# Patient Record
Sex: Male | Born: 1950
Health system: Southern US, Community
[De-identification: ages and names within clinical notes are randomized; demographics above are authoritative.]

## PROBLEM LIST (undated history)

## (undated) DIAGNOSIS — I10 Essential (primary) hypertension: Secondary | ICD-10-CM

## (undated) DIAGNOSIS — E079 Disorder of thyroid, unspecified: Secondary | ICD-10-CM

## (undated) DIAGNOSIS — E119 Type 2 diabetes mellitus without complications: Secondary | ICD-10-CM

## (undated) DIAGNOSIS — E785 Hyperlipidemia, unspecified: Secondary | ICD-10-CM

## (undated) DIAGNOSIS — F329 Major depressive disorder, single episode, unspecified: Secondary | ICD-10-CM

## (undated) DIAGNOSIS — Z9989 Dependence on other enabling machines and devices: Secondary | ICD-10-CM

## (undated) DIAGNOSIS — I2510 Atherosclerotic heart disease of native coronary artery without angina pectoris: Secondary | ICD-10-CM

## (undated) DIAGNOSIS — G4733 Obstructive sleep apnea (adult) (pediatric): Secondary | ICD-10-CM

## (undated) HISTORY — PX: RETINAL DETACHMENT SURGERY: SHX105

## (undated) HISTORY — DX: Atherosclerotic heart disease of native coronary artery without angina pectoris: I25.10

## (undated) HISTORY — PX: APPENDECTOMY: SHX54

## (undated) HISTORY — DX: Disorder of thyroid, unspecified: E07.9

## (undated) HISTORY — DX: Essential (primary) hypertension: I10

## (undated) HISTORY — DX: Hyperlipidemia, unspecified: E78.5

## (undated) HISTORY — DX: Major depressive disorder, single episode, unspecified: F32.9

## (undated) HISTORY — DX: Obstructive sleep apnea (adult) (pediatric): G47.33

## (undated) HISTORY — DX: Type 2 diabetes mellitus without complications: E11.9

## (undated) HISTORY — DX: Dependence on other enabling machines and devices: Z99.89

## (undated) HISTORY — PX: CATARACT EXTRACTION, BILATERAL: SHX1313

---

## 1993-07-26 DIAGNOSIS — F32A Depression, unspecified: Secondary | ICD-10-CM

## 1993-07-26 DIAGNOSIS — E079 Disorder of thyroid, unspecified: Secondary | ICD-10-CM

## 1993-07-26 HISTORY — DX: Depression, unspecified: F32.A

## 1993-07-26 HISTORY — DX: Disorder of thyroid, unspecified: E07.9

## 1996-02-24 DIAGNOSIS — E785 Hyperlipidemia, unspecified: Secondary | ICD-10-CM

## 1996-02-24 HISTORY — PX: CORONARY ARTERY BYPASS GRAFT: SHX141

## 1996-02-24 HISTORY — PX: OTHER SURGICAL HISTORY: SHX169

## 1996-02-24 HISTORY — DX: Hyperlipidemia, unspecified: E78.5

## 1996-03-26 HISTORY — PX: OTHER SURGICAL HISTORY: SHX169

## 1999-01-21 ENCOUNTER — Encounter: Payer: Self-pay | Admitting: Family Medicine

## 1999-01-21 LAB — CONVERTED CEMR LAB: Hgb A1c MFr Bld: 7.3 %

## 2000-07-08 ENCOUNTER — Encounter: Payer: Self-pay | Admitting: Family Medicine

## 2000-07-08 LAB — CONVERTED CEMR LAB: PSA: 0.4 ng/mL

## 2000-12-11 ENCOUNTER — Encounter: Payer: Self-pay | Admitting: Emergency Medicine

## 2000-12-11 ENCOUNTER — Emergency Department (HOSPITAL_COMMUNITY): Admission: EM | Admit: 2000-12-11 | Discharge: 2000-12-11 | Payer: Self-pay | Admitting: Emergency Medicine

## 2001-11-24 ENCOUNTER — Encounter: Payer: Self-pay | Admitting: Family Medicine

## 2001-11-24 LAB — CONVERTED CEMR LAB: PSA: 0.3 ng/mL

## 2001-12-07 ENCOUNTER — Encounter: Payer: Self-pay | Admitting: Family Medicine

## 2001-12-07 LAB — CONVERTED CEMR LAB: Hgb A1c MFr Bld: 5.4 %

## 2002-06-19 ENCOUNTER — Encounter: Payer: Self-pay | Admitting: Family Medicine

## 2002-06-19 LAB — CONVERTED CEMR LAB: Hgb A1c MFr Bld: 5.7 %

## 2002-12-27 ENCOUNTER — Encounter: Payer: Self-pay | Admitting: Family Medicine

## 2002-12-27 LAB — CONVERTED CEMR LAB: PSA: 0.2 ng/mL

## 2002-12-28 ENCOUNTER — Encounter: Payer: Self-pay | Admitting: Family Medicine

## 2002-12-28 LAB — CONVERTED CEMR LAB: Hgb A1c MFr Bld: 5.9 %

## 2005-01-12 ENCOUNTER — Ambulatory Visit: Payer: Self-pay | Admitting: Family Medicine

## 2005-06-22 ENCOUNTER — Ambulatory Visit: Payer: Self-pay | Admitting: Family Medicine

## 2005-06-22 LAB — CONVERTED CEMR LAB: PSA: 0.32 ng/mL

## 2005-06-24 ENCOUNTER — Ambulatory Visit: Payer: Self-pay | Admitting: Family Medicine

## 2005-12-24 ENCOUNTER — Ambulatory Visit: Payer: Self-pay | Admitting: Family Medicine

## 2006-01-03 ENCOUNTER — Ambulatory Visit (HOSPITAL_BASED_OUTPATIENT_CLINIC_OR_DEPARTMENT_OTHER): Admission: RE | Admit: 2006-01-03 | Discharge: 2006-01-03 | Payer: Self-pay | Admitting: Family Medicine

## 2006-01-03 HISTORY — PX: OTHER SURGICAL HISTORY: SHX169

## 2006-01-07 ENCOUNTER — Ambulatory Visit: Payer: Self-pay | Admitting: Family Medicine

## 2006-01-12 ENCOUNTER — Ambulatory Visit: Payer: Self-pay | Admitting: Pulmonary Disease

## 2006-05-06 ENCOUNTER — Ambulatory Visit: Payer: Self-pay | Admitting: Family Medicine

## 2006-12-01 ENCOUNTER — Ambulatory Visit: Payer: Self-pay | Admitting: Internal Medicine

## 2006-12-10 ENCOUNTER — Ambulatory Visit: Payer: Self-pay | Admitting: Family Medicine

## 2007-02-16 ENCOUNTER — Ambulatory Visit: Payer: Self-pay | Admitting: Family Medicine

## 2007-02-16 LAB — CONVERTED CEMR LAB
ALT: 66 units/L — ABNORMAL HIGH (ref 0–40)
AST: 49 units/L — ABNORMAL HIGH (ref 0–37)
Albumin: 3.9 g/dL (ref 3.5–5.2)
Alkaline Phosphatase: 65 units/L (ref 39–117)
BUN: 11 mg/dL (ref 6–23)
Basophils Absolute: 0.1 10*3/uL (ref 0.0–0.1)
Basophils Relative: 1.1 % — ABNORMAL HIGH (ref 0.0–1.0)
Bilirubin, Direct: 0.1 mg/dL (ref 0.0–0.3)
CO2: 30 meq/L (ref 19–32)
Calcium: 8.8 mg/dL (ref 8.4–10.5)
Chloride: 107 meq/L (ref 96–112)
Cholesterol: 183 mg/dL (ref 0–200)
Creatinine, Ser: 0.9 mg/dL (ref 0.4–1.5)
Eosinophils Absolute: 0.1 10*3/uL (ref 0.0–0.6)
Eosinophils Relative: 2.2 % (ref 0.0–5.0)
Ferritin: 323.7 ng/mL — ABNORMAL HIGH (ref 22.0–322.0)
Free T4: 0.7 ng/dL (ref 0.6–1.6)
GFR calc Af Amer: 112 mL/min
GFR calc non Af Amer: 92 mL/min
Glucose, Bld: 103 mg/dL — ABNORMAL HIGH (ref 70–99)
HCT: 41.6 % (ref 39.0–52.0)
HDL: 45 mg/dL (ref >39.0)
Hemoglobin: 14.3 g/dL (ref 13.0–17.0)
LDL Cholesterol: 109 mg/dL — ABNORMAL HIGH (ref 0–99)
Lymphocytes Relative: 40.1 % (ref 12.0–46.0)
MCHC: 34.4 g/dL (ref 30.0–36.0)
MCV: 102.4 fL — ABNORMAL HIGH (ref 78.0–100.0)
Monocytes Absolute: 0.9 10*3/uL — ABNORMAL HIGH (ref 0.2–0.7)
Monocytes Relative: 18.9 % — ABNORMAL HIGH (ref 3.0–11.0)
Neutro Abs: 1.9 10*3/uL (ref 1.4–7.7)
Neutrophils Relative %: 37.7 % — ABNORMAL LOW (ref 43.0–77.0)
PSA: 0.51 ng/mL (ref 0.10–4.00)
Platelets: 259 10*3/uL (ref 150–400)
Potassium: 4.6 meq/L (ref 3.5–5.1)
RBC: 4.06 M/uL — ABNORMAL LOW (ref 4.22–5.81)
RDW: 12.3 % (ref 11.5–14.6)
Sodium: 142 meq/L (ref 135–145)
TSH: 1.97 microintl units/mL (ref 0.35–5.50)
Total Bilirubin: 0.6 mg/dL (ref 0.3–1.2)
Total CHOL/HDL Ratio: 4.1
Total Protein: 7.3 g/dL (ref 6.0–8.3)
Triglycerides: 144 mg/dL (ref 0–149)
VLDL: 29 mg/dL (ref 0–40)
Vitamin B-12: >1500 pg/mL — ABNORMAL HIGH (ref 211–911)
WBC: 5 10*3/uL (ref 4.5–10.5)

## 2007-02-17 ENCOUNTER — Encounter: Payer: Self-pay | Admitting: Family Medicine

## 2007-02-17 DIAGNOSIS — E039 Hypothyroidism, unspecified: Secondary | ICD-10-CM | POA: Insufficient documentation

## 2007-02-17 DIAGNOSIS — I10 Essential (primary) hypertension: Secondary | ICD-10-CM | POA: Insufficient documentation

## 2007-02-17 DIAGNOSIS — E785 Hyperlipidemia, unspecified: Secondary | ICD-10-CM | POA: Insufficient documentation

## 2007-02-17 DIAGNOSIS — I252 Old myocardial infarction: Secondary | ICD-10-CM | POA: Insufficient documentation

## 2007-02-17 DIAGNOSIS — I2510 Atherosclerotic heart disease of native coronary artery without angina pectoris: Secondary | ICD-10-CM | POA: Insufficient documentation

## 2007-02-17 LAB — CONVERTED CEMR LAB: PSA: 0.51 ng/mL

## 2007-02-18 ENCOUNTER — Ambulatory Visit: Payer: Self-pay | Admitting: Family Medicine

## 2007-05-17 ENCOUNTER — Ambulatory Visit: Payer: Self-pay | Admitting: Family Medicine

## 2007-05-17 DIAGNOSIS — S336XXA Sprain of sacroiliac joint, initial encounter: Secondary | ICD-10-CM | POA: Insufficient documentation

## 2007-06-03 ENCOUNTER — Ambulatory Visit: Payer: Self-pay | Admitting: Family Medicine

## 2007-10-31 ENCOUNTER — Ambulatory Visit: Payer: Self-pay | Admitting: Family Medicine

## 2007-10-31 LAB — CONVERTED CEMR LAB
ALT: 102 units/L — ABNORMAL HIGH (ref 0–53)
AST: 55 units/L — ABNORMAL HIGH (ref 0–37)
BUN: 16 mg/dL (ref 6–23)
Basophils Absolute: 0.1 10*3/uL (ref 0.0–0.1)
Basophils Relative: 0.9 % (ref 0.0–1.0)
CO2: 29 meq/L (ref 19–32)
Calcium: 9.6 mg/dL (ref 8.4–10.5)
Chloride: 100 meq/L (ref 96–112)
Cholesterol: 180 mg/dL (ref 0–200)
Creatinine, Ser: 1 mg/dL (ref 0.4–1.5)
Eosinophils Absolute: 0.1 10*3/uL (ref 0.0–0.6)
Eosinophils Relative: 1.9 % (ref 0.0–5.0)
Free T4: 0.8 ng/dL (ref 0.6–1.6)
GFR calc Af Amer: 99 mL/min
GFR calc non Af Amer: 82 mL/min
Glucose, Bld: 128 mg/dL — ABNORMAL HIGH (ref 70–99)
HCT: 46.4 % (ref 39.0–52.0)
HDL: 44.3 mg/dL (ref >39.0)
Hemoglobin: 15.9 g/dL (ref 13.0–17.0)
LDL Cholesterol: 115 mg/dL — ABNORMAL HIGH (ref 0–99)
Lymphocytes Relative: 31.4 % (ref 12.0–46.0)
MCHC: 34.2 g/dL (ref 30.0–36.0)
MCV: 103.9 fL — ABNORMAL HIGH (ref 78.0–100.0)
Monocytes Absolute: 1.2 10*3/uL — ABNORMAL HIGH (ref 0.2–0.7)
Monocytes Relative: 16.9 % — ABNORMAL HIGH (ref 3.0–11.0)
Neutro Abs: 3.4 10*3/uL (ref 1.4–7.7)
Neutrophils Relative %: 48.9 % (ref 43.0–77.0)
PSA: 0.37 ng/mL (ref 0.10–4.00)
Platelets: 241 10*3/uL (ref 150–400)
Potassium: 4.4 meq/L (ref 3.5–5.1)
RBC: 4.47 M/uL (ref 4.22–5.81)
RDW: 11.8 % (ref 11.5–14.6)
Sodium: 137 meq/L (ref 135–145)
TSH: 2.92 microintl units/mL (ref 0.35–5.50)
Total CHOL/HDL Ratio: 4.1
Triglycerides: 106 mg/dL (ref 0–149)
VLDL: 21 mg/dL (ref 0–40)
WBC: 7 10*3/uL (ref 4.5–10.5)

## 2007-11-03 ENCOUNTER — Ambulatory Visit: Payer: Self-pay | Admitting: Family Medicine

## 2007-11-03 DIAGNOSIS — R945 Abnormal results of liver function studies: Secondary | ICD-10-CM | POA: Insufficient documentation

## 2008-03-13 ENCOUNTER — Ambulatory Visit: Payer: Self-pay | Admitting: Family Medicine

## 2008-03-28 ENCOUNTER — Telehealth: Payer: Self-pay | Admitting: Family Medicine

## 2008-04-05 ENCOUNTER — Telehealth: Payer: Self-pay | Admitting: Family Medicine

## 2009-02-14 ENCOUNTER — Telehealth: Payer: Self-pay | Admitting: Family Medicine

## 2009-04-09 ENCOUNTER — Telehealth: Payer: Self-pay | Admitting: Family Medicine

## 2009-04-18 ENCOUNTER — Ambulatory Visit: Payer: Self-pay | Admitting: Family Medicine

## 2009-05-22 ENCOUNTER — Encounter: Payer: Self-pay | Admitting: Family Medicine

## 2009-07-15 ENCOUNTER — Ambulatory Visit: Payer: Self-pay | Admitting: Family Medicine

## 2009-07-15 LAB — CONVERTED CEMR LAB
ALT: 37 units/L (ref 0–53)
AST: 27 units/L (ref 0–37)
Albumin: 3.8 g/dL (ref 3.5–5.2)
Alkaline Phosphatase: 61 units/L (ref 39–117)
BUN: 17 mg/dL (ref 6–23)
Basophils Absolute: 0 10*3/uL (ref 0.0–0.1)
Basophils Relative: 0.4 % (ref 0.0–3.0)
Bilirubin, Direct: 0 mg/dL (ref 0.0–0.3)
CO2: 29 meq/L (ref 19–32)
Calcium: 9.2 mg/dL (ref 8.4–10.5)
Chloride: 103 meq/L (ref 96–112)
Cholesterol: 156 mg/dL (ref 0–200)
Creatinine, Ser: 0.8 mg/dL (ref 0.4–1.5)
Eosinophils Absolute: 0.1 10*3/uL (ref 0.0–0.7)
Eosinophils Relative: 1.9 % (ref 0.0–5.0)
Free T4: 0.9 ng/dL (ref 0.6–1.6)
GFR calc non Af Amer: 104.94 mL/min (ref >60)
Glucose, Bld: 106 mg/dL — ABNORMAL HIGH (ref 70–99)
HCT: 43.8 % (ref 39.0–52.0)
HDL: 34.4 mg/dL — ABNORMAL LOW (ref >39.00)
Hemoglobin: 14.8 g/dL (ref 13.0–17.0)
LDL Cholesterol: 94 mg/dL (ref 0–99)
Lymphocytes Relative: 34.7 % (ref 12.0–46.0)
Lymphs Abs: 2.4 10*3/uL (ref 0.7–4.0)
MCHC: 33.7 g/dL (ref 30.0–36.0)
MCV: 99.6 fL (ref 78.0–100.0)
Monocytes Absolute: 1.1 10*3/uL — ABNORMAL HIGH (ref 0.1–1.0)
Monocytes Relative: 15.9 % — ABNORMAL HIGH (ref 3.0–12.0)
Neutro Abs: 3.3 10*3/uL (ref 1.4–7.7)
Neutrophils Relative %: 47.1 % (ref 43.0–77.0)
PSA: 0.3 ng/mL (ref 0.10–4.00)
Platelets: 272 10*3/uL (ref 150.0–400.0)
Potassium: 4.9 meq/L (ref 3.5–5.1)
RBC: 4.4 M/uL (ref 4.22–5.81)
RDW: 12.4 % (ref 11.5–14.6)
Sodium: 138 meq/L (ref 135–145)
TSH: 0.24 microintl units/mL — ABNORMAL LOW (ref 0.35–5.50)
Total Bilirubin: 0.7 mg/dL (ref 0.3–1.2)
Total CHOL/HDL Ratio: 5
Total Protein: 7.4 g/dL (ref 6.0–8.3)
Triglycerides: 138 mg/dL (ref 0.0–149.0)
VLDL: 27.6 mg/dL (ref 0.0–40.0)
WBC: 6.9 10*3/uL (ref 4.5–10.5)

## 2009-07-16 LAB — CONVERTED CEMR LAB: Vit D, 25-Hydroxy: 22 ng/mL — ABNORMAL LOW (ref 30–89)

## 2009-07-17 ENCOUNTER — Ambulatory Visit: Payer: Self-pay | Admitting: Family Medicine

## 2009-07-17 DIAGNOSIS — K625 Hemorrhage of anus and rectum: Secondary | ICD-10-CM | POA: Insufficient documentation

## 2009-07-29 ENCOUNTER — Ambulatory Visit: Payer: Self-pay | Admitting: Family Medicine

## 2009-11-18 ENCOUNTER — Telehealth: Payer: Self-pay | Admitting: Family Medicine

## 2009-11-26 ENCOUNTER — Ambulatory Visit: Payer: Self-pay | Admitting: Family Medicine

## 2010-05-19 ENCOUNTER — Telehealth: Payer: Self-pay | Admitting: Family Medicine

## 2010-05-28 ENCOUNTER — Encounter (INDEPENDENT_AMBULATORY_CARE_PROVIDER_SITE_OTHER): Payer: Self-pay | Admitting: *Deleted

## 2010-06-11 ENCOUNTER — Telehealth: Payer: Self-pay | Admitting: Family Medicine

## 2010-07-01 ENCOUNTER — Ambulatory Visit: Payer: Self-pay | Admitting: Family Medicine

## 2010-07-19 ENCOUNTER — Ambulatory Visit: Payer: Self-pay | Admitting: Ophthalmology

## 2010-08-13 ENCOUNTER — Ambulatory Visit: Payer: Self-pay | Admitting: Family Medicine

## 2010-08-13 DIAGNOSIS — K439 Ventral hernia without obstruction or gangrene: Secondary | ICD-10-CM | POA: Insufficient documentation

## 2010-08-26 ENCOUNTER — Ambulatory Visit: Payer: Self-pay | Admitting: Family Medicine

## 2010-08-27 LAB — CONVERTED CEMR LAB
ALT: 33 units/L (ref 0–53)
AST: 29 units/L (ref 0–37)
Albumin: 3.9 g/dL (ref 3.5–5.2)
Alkaline Phosphatase: 64 units/L (ref 39–117)
BUN: 16 mg/dL (ref 6–23)
Basophils Absolute: 0 10*3/uL (ref 0.0–0.1)
Basophils Relative: 0.5 % (ref 0.0–3.0)
Bilirubin, Direct: 0 mg/dL (ref 0.0–0.3)
CO2: 30 meq/L (ref 19–32)
Calcium: 9.7 mg/dL (ref 8.4–10.5)
Chloride: 105 meq/L (ref 96–112)
Cholesterol: 177 mg/dL (ref 0–200)
Creatinine, Ser: 0.9 mg/dL (ref 0.4–1.5)
Eosinophils Absolute: 0.1 10*3/uL (ref 0.0–0.7)
Eosinophils Relative: 1.2 % (ref 0.0–5.0)
Free T4: 0.77 ng/dL (ref 0.60–1.60)
GFR calc non Af Amer: 90.11 mL/min (ref >60)
Glucose, Bld: 113 mg/dL — ABNORMAL HIGH (ref 70–99)
HCT: 41 % (ref 39.0–52.0)
HDL: 39 mg/dL — ABNORMAL LOW (ref >39.00)
Hemoglobin: 14.2 g/dL (ref 13.0–17.0)
LDL Cholesterol: 102 mg/dL — ABNORMAL HIGH (ref 0–99)
Lymphocytes Relative: 30.8 % (ref 12.0–46.0)
Lymphs Abs: 2.3 10*3/uL (ref 0.7–4.0)
MCHC: 34.5 g/dL (ref 30.0–36.0)
MCV: 99.9 fL (ref 78.0–100.0)
Monocytes Absolute: 1.1 10*3/uL — ABNORMAL HIGH (ref 0.1–1.0)
Monocytes Relative: 14.8 % — ABNORMAL HIGH (ref 3.0–12.0)
Neutro Abs: 3.9 10*3/uL (ref 1.4–7.7)
Neutrophils Relative %: 52.7 % (ref 43.0–77.0)
PSA: 0.33 ng/mL (ref 0.10–4.00)
Platelets: 273 10*3/uL (ref 150.0–400.0)
Potassium: 5 meq/L (ref 3.5–5.1)
RBC: 4.1 M/uL — ABNORMAL LOW (ref 4.22–5.81)
RDW: 13.5 % (ref 11.5–14.6)
Sodium: 140 meq/L (ref 135–145)
TSH: 0.75 microintl units/mL (ref 0.35–5.50)
Total Bilirubin: 0.4 mg/dL (ref 0.3–1.2)
Total CHOL/HDL Ratio: 5
Total Protein: 7.1 g/dL (ref 6.0–8.3)
Triglycerides: 180 mg/dL — ABNORMAL HIGH (ref 0.0–149.0)
VLDL: 36 mg/dL (ref 0.0–40.0)
Vit D, 25-Hydroxy: 34 ng/mL (ref 30–89)
WBC: 7.4 10*3/uL (ref 4.5–10.5)

## 2010-08-28 ENCOUNTER — Ambulatory Visit: Payer: Self-pay | Admitting: Family Medicine

## 2010-11-27 NOTE — Assessment & Plan Note (Signed)
Summary: COLD/CLE   Vital Signs:  Patient profile:   60 year old male Weight:      201 pounds O2 Sat:      96 % on Room air Temp:     98.5 degrees F oral Pulse rate:   80 / minute Pulse rhythm:   regular BP sitting:   110 / 70  (left arm) Cuff size:   large  Vitals Entered By: Sydell Axon LPN (November 26, 2009 10:35 AM)  O2 Flow:  Room air CC: Head congestion, cough and fatigue   History of Present Illness: Pt here for 21/2 weeks, he is having better headache from initially, no fever, no chills, no ear pain, rhinitis thatis clear, no ST and sifnif cough now minimally productive...having hard time with getting his energy back. He is sleeping ok. The ST wasn't terrible. He has no SOB or N/V. He has taken Amox and guaifenesin. His biggest problem with this is lavck of energy. He just can't get his energy level to wear he feels like he can do his job. And his wife has something planned for his birthday on the 10th.   Problems Prior to Update: 1)  Rectal Bleeding  (ICD-569.3) 2)  Health Maintenance Exam  (ICD-V70.0) 3)  Liver Function Tests, Abnormal  (ICD-794.8) 4)  Fatigue  (ICD-780.79) 5)  Special Screening Malignant Neoplasm of Prostate  (ICD-V76.44) 6)  Sprain/strain, Sacroiliac Site Nos  (ICD-846.9) 7)  Infarction, Myocardial, Old  (ICD-412) 8)  Plantar Fasciitis  (ICD-728.71) 9)  Hematuria, Microscopic, Hx of (HUMPHRIES)  (ICD-V13.09) 10)  Cad  (ICD-414.00) 11)  Hypothyroidism  (ICD-244.9) 12)  Hypertension  (ICD-401.9) 13)  Hyperlipidemia  (ICD-272.4) 14)  Depression,recurrent  (ICD-311)  Medications Prior to Update: 1)  Synthroid 175 Mcg Tabs (Levothyroxine Sodium) .Marland Kitchen.. 1 Tablet By Mouth Once A Day 2)  Lisinopril 20 Mg Tabs (Lisinopril) .... Take 1 Tablet By Mouth Once A Day 3)  Zoloft 50 Mg  Tabs (Sertraline Hcl) .Marland Kitchen.. 1 Tablet Daily 4)  Aspirin 325 Mg  Tabs (Aspirin) .... Take 1 Tablet By Mouth Once A Day 5)  Vitamin D (Ergocalciferol) 50000 Unit Caps  (Ergocalciferol) .... One Tab By Mouth Weekly. 6)  Anusol-Hc 25 Mg Supp (Hydrocortisone Acetate) .... One Supp Per Rectum Three Times A Day 7)  Amoxicillin 500 Mg Tabs (Amoxicillin) .... One Tab By Mouth Three Times A Day  Allergies: No Known Drug Allergies  Physical Exam  General:  Well-developed,well-nourished,in no acute distress; alert,appropriate and cooperative throughout examination. Mildly obese. Mildly congested. Head:  Normocephalic and atraumatic without obvious abnormalities. No apparent alopecia or balding. Sinuses non tender. Eyes:  Conjunctiva clear bilaterally.  Ears:  External ear exam shows no significant lesions or deformities.  Otoscopic examination reveals clear canals, tympanic membranes are intact bilaterally without bulging, retraction, inflammation or discharge. Hearing is grossly normal bilaterally. Nose:  External nasal examination shows no deformity or inflammation. Nasal mucosa are pink and moist without lesions or exudates. Mouth:  Oral mucosa and oropharynx without lesions or exudates.  Teeth in good repair. White PND. Neck:  No deformities, masses, or tenderness noted. Chest Wall:  No deformities, masses, tenderness or gynecomastia noted. Lungs:  Normal respiratory effort, chest expands symmetrically. Lungs are clear to auscultation, no crackles or wheezes. Heart:  Normal rate and regular rhythm. S1 and S2 normal without gallop, murmur, click, rub or other extra sounds.   Impression & Recommendations:  Problem # 1:  URI (ICD-465.9) Assessment New  Think he is  in the middle to end of a viral syndrome and must fight it off. He is sleeping ok and cough not debilitating at this point. Cont guaifenesin and let me know if sxs worsen. His updated medication list for this problem includes:    Aspirin 325 Mg Tabs (Aspirin) .Marland Kitchen... Take 1 tablet by mouth once a day  Instructed on symptomatic treatment. Call if symptoms persist or worsen.   Complete Medication  List: 1)  Synthroid 175 Mcg Tabs (Levothyroxine sodium) .Marland Kitchen.. 1 tablet by mouth once a day 2)  Lisinopril 20 Mg Tabs (Lisinopril) .... Take 1 tablet by mouth once a day 3)  Zoloft 50 Mg Tabs (Sertraline hcl) .Marland Kitchen.. 1 tablet daily 4)  Aspirin 325 Mg Tabs (Aspirin) .... Take 1 tablet by mouth once a day 5)  Vitamin D (ergocalciferol) 50000 Unit Caps (Ergocalciferol) .... One tab by mouth weekly. 6)  Anusol-hc 25 Mg Supp (Hydrocortisone acetate) .... One supp per rectum three times a day  Patient Instructions: 1)  Call if worsens to consider Zpak in f/u of Amox.  Current Allergies (reviewed today): No known allergies

## 2010-11-27 NOTE — Assessment & Plan Note (Signed)
Summary: 71mo follow up/eve   Vital Signs:  Patient Profile:   60 Years Old Male Height:     65 inches (165.1 cm) Weight:      196 pounds Temp:     98.5 degrees F oral Pulse rate:   72 / minute Pulse rhythm:   regular BP sitting:   120 / 78  (left arm) Cuff size:   regular  Vitals Entered By: Providence Crosby (November 03, 2007 9:14 AM)                 Chief Complaint:  6 month f/u.  History of Present Illness: 6 month follow up for hyperlipidemia, hypothyroidism and depression.  Tolerating medications well.  No side effects.  Depression well controlled. Complains of fatigue throughout the day  Current Allergies: No known allergies       Physical Exam  General:     Well-developed,well-nourished,in no acute distress; alert,appropriate and cooperative throughout examination Head:     Normocephalic and atraumatic without obvious abnormalities. No apparent alopecia or balding. Eyes:     Conjunctiva clear bilaterally.  Ears:     External ear exam shows no significant lesions or deformities.  Otoscopic examination reveals clear canals, tympanic membranes are intact bilaterally without bulging, retraction, inflammation or discharge. Hearing is grossly normal bilaterally. Nose:     External nasal examination shows no deformity or inflammation. Nasal mucosa are pink and moist without lesions or exudates. Mouth:     Oral mucosa and oropharynx without lesions or exudates.  Teeth in good repair. Neck:     No deformities, masses, or tenderness noted. Chest Wall:     No deformities, masses, tenderness or gynecomastia noted. Lungs:     Normal respiratory effort, chest expands symmetrically. Lungs are clear to auscultation, no crackles or wheezes. Heart:     Normal rate and regular rhythm. S1 and S2 normal without gallop, murmur, click, rub or other extra sounds. Abdomen:     Bowel sounds positive,abdomen soft and non-tender without masses, organomegaly or hernias noted.     Impression & Recommendations:  Problem # 1:  SPECIAL SCREENING MALIGNANT NEOPLASM OF PROSTATE (ICD-V76.44) Assessment: Unchanged Stable.  Problem # 2:  INFARCTION, MYOCARDIAL, OLD (ICD-412) Assessment: Unchanged Stable. His updated medication list for this problem includes:    Lisinopril 20 Mg Tabs (Lisinopril) .Marland Kitchen... Take 1 tablet by mouth once a day   Problem # 3:  HYPERTENSION (ICD-401.9) Assessment: Unchanged Stable. His updated medication list for this problem includes:    Lisinopril 20 Mg Tabs (Lisinopril) .Marland Kitchen... Take 1 tablet by mouth once a day  BP today: 120/78 Prior BP: 120/80 (06/03/2007)  Labs Reviewed: Creat: 1.0 (10/31/2007) Chol: 180 (10/31/2007)   HDL: 44.3 (10/31/2007)   LDL: 115 (10/31/2007)   TG: 106 (10/31/2007)   Problem # 4:  HYPERLIPIDEMIA (ICD-272.4) Assessment: Unchanged Needs to be better..declines meds ..is going to start regular exercise. Labs Reviewed: Chol: 180 (10/31/2007)   HDL: 44.3 (10/31/2007)   LDL: 115 (10/31/2007)   TG: 106 (10/31/2007) SGOT: 55 (10/31/2007)   SGPT: 102 (10/31/2007)   Problem # 5:  DEPRESSION,RECURRENT (ICD-311) Assessment: Improved Wants to d/c meds..?causing fatigue?  will taper, is currently already down to 1/2 once daily. The following medications were removed from the medication list:    Zoloft 50 Mg Tabs (Sertraline hcl) .Marland Kitchen... 1 qd  His updated medication list for this problem includes:    Zoloft 50 Mg Tabs (Sertraline hcl) .Marland Kitchen... 1/2 tablet daily   Problem #  6:  FATIGUE (ICD-780.79) Assessment: New Start exercise...stop Zoloft...is sleeping well with CPAP, CBC ok as is Thyroid...will follow.  Problem # 7:  LIVER FUNCTION TESTS, ABNORMAL (ICD-794.8) Assessment: New just stopped drinking.Marland Kitchenadmits was drinking too much...will recheck in future.  Complete Medication List: 1)  Synthroid 175 Mcg Tabs (Levothyroxine sodium) .Marland Kitchen.. 1 tablet by mouth once a day 2)  Lisinopril 20 Mg Tabs (Lisinopril) .... Take 1  tablet by mouth once a day 3)  Flexeril 10 Mg Tabs (Cyclobenzaprine hcl) .... One tab by mouth three times a day 4)  Soma 350 Mg Tabs (Carisoprodol) .... One tab by mouth qid as needed musc spasm 5)  Zoloft 50 Mg Tabs (Sertraline hcl) .... 1/2 tablet daily   Patient Instructions: 1)  RTC one month 2)  Check Fatigue, eventually check SGOT, SGPT, GGT    ]

## 2010-11-27 NOTE — Letter (Signed)
Summary: Nadara Eaton letter  [facility] [facility]  [address] [place], [place] [address]   Phone: [phone]  Fax: [phone]       05/28/2010 MRN: [id number]  CASEN PRYOR [address] [place], [place]  [address]  Dear Mr. [facility] [place], and [facility] announce the retirement of Arta Silence, M.D., from full-time practice at the [facility] office effective April 24, 2010 and his plans of returning part-time.  It is important to Dr. Hetty Ely and to our practice that you understand that [facility] has seven physicians in our office for your health care needs.  We will continue to offer the same exceptional care that you have today.    Dr. Hetty Ely has spoken to many of you about his plans for retirement and returning part-time in the fall.   We will continue to work with you through the transition to schedule appointments for you in the office and meet the high standards that [facility] is committed to.   Again, it is with great pleasure that we share the news that Dr. Hetty Ely will return to [facility] at [facility] in October of 2011 with a reduced schedule.    If you have any questions, or would like to request an appointment with one of our physicians, please call us at [phone] and press the option for Scheduling an appointment.  We take pleasure in providing you with excellent patient care and look forward to seeing you at your next office visit.  Our [facility] Physicians are:  Tillman Abide, M.D. Laurita Quint, M.D. Roxy Manns, M.D. Kerby Nora, M.D. Hannah Beat, M.D. Ruthe Mannan, M.D. We proudly welcomed Raechel Ache, M.D. and Eustaquio Boyden, M.D. to the practice in July/August 2011.  Sincerely,  [facility]

## 2010-11-27 NOTE — Progress Notes (Signed)
Summary: RX for mail order Drugs  Phone Note Call from Patient Call back at [phone]   Caller: Patient Call For: Hetty Ely Summary of Call: Pt needs new RX written for mail order for  Synthroid,  Lisinopril, Zoloft 90 day supply w/3 refills. Please call pt on cell when ready then pt will give you instructions on where he wants you to send or fax them. Initial call taken by: Mickle Asper,  March 28, 2008 8:58 AM  Follow-up for Phone Call        WANTED RX FAXED TO [facility]// TOLD WE DON'T DO THAT ANY MORE NEEDS TO PICK UP PRESCRIBTIONS Follow-up by: Providence Crosby,  March 28, 2008 1:51 PM      Prescriptions: ZOLOFT 50 MG  TABS (SERTRALINE HCL) 1/2 tablet daily  #45 x 4   Entered by:   Providence Crosby   Authorized by:   Shaune Leeks MD   Signed by:   Providence Crosby on 03/28/2008   Method used:   Print then Give to Patient   RxID:   [id number] LISINOPRIL 20 MG TABS (LISINOPRIL) Take 1 tablet by mouth once a day  #90 x 4   Entered by:   Providence Crosby   Authorized by:   Shaune Leeks MD   Signed by:   Providence Crosby on 03/28/2008   Method used:   Print then Give to Patient   RxID:   [id number] SYNTHROID 175 MCG TABS (LEVOTHYROXINE SODIUM) 1 tablet by mouth once a day  #90 x 4   Entered by:   Providence Crosby   Authorized by:   Shaune Leeks MD   Signed by:   Providence Crosby on 03/28/2008   Method used:   Print then Give to Patient   RxID:   [id number]    Shaune Leeks MD  March 28, 2008 12:04 PM

## 2010-11-27 NOTE — Progress Notes (Signed)
Summary: needs refill on zoloft  Phone Note Refill Request Message from:  Patient  Refills Requested: Medication #1:  ZOLOFT 50 MG  TABS 1/2 tablet daily Phoned request from pt, please send a one month's supply  to [facility].  Initial call taken by: Lowella Petties,  February 14, 2009 11:17 AM      Prescriptions: ZOLOFT 50 MG  TABS (SERTRALINE HCL) 1/2 tablet daily  #15 x 1   Entered and Authorized by:   Shaune Leeks MD   Signed by:   Shaune Leeks MD on 02/14/2009   Method used:   Electronically to        [facility]  [address] [id number] (retail)       [address]       [place], [place]  [address]       Ph: [phone] or [phone]       Fax: [phone]   RxID:   [id number]

## 2010-11-27 NOTE — Assessment & Plan Note (Signed)
Summary: CPX/CLE   Vital Signs:  Patient profile:   60 year old male Weight:      200.50 pounds Temp:     97.9 degrees F oral Pulse rate:   84 / minute Pulse rhythm:   regular BP sitting:   110 / 74  (left arm) Cuff size:   large  Vitals Entered By: Sydell Axon LPN September 08, 2010 9:20 AM) CC: 30 Minute checkup   History of Present Illness: Pt here for Comp Exam...he feels well with no complaints except occas joint discomfort. His left wrist hurts occas feeling like itr needs to pop.  Preventive Screening-Counseling & Management  Alcohol-Tobacco     Alcohol drinks/day: <1 Rare     Alcohol type: wine     Smoking Status: quit     Packs/Day: 02/1996 25 PYH  Caffeine-Diet-Exercise     Caffeine use/day: 2     Does Patient Exercise: no  Problems Prior to Update: 1)  Ventral Hernia, Asymptomatic  (ICD-553.20) 2)  Rectal Bleeding  (ICD-569.3) 3)  Health Maintenance Exam  (ICD-V70.0) 4)  Liver Function Tests, Abnormal  (ICD-794.8) 5)  Fatigue  (ICD-780.79) 6)  Special Screening Malignant Neoplasm of Prostate  (ICD-V76.44) 7)  Sprain/strain, Sacroiliac Site Nos  (ICD-846.9) 8)  Infarction, Myocardial, Old  (ICD-412) 9)  Plantar Fasciitis  (ICD-728.71) 10)  Hematuria, Microscopic, Hx of (HUMPHRIES)  (ICD-V13.09) 11)  Cad  (ICD-414.00) 12)  Hypothyroidism  (ICD-244.9) 13)  Hypertension  (ICD-401.9) 14)  Hyperlipidemia  (ICD-272.4) 15)  Depression,recurrent  (ICD-311)  Medications Prior to Update: 1)  Synthroid 175 Mcg Tabs (Levothyroxine Sodium) .Marland Kitchen.. 1 Tablet By Mouth Once A Day 2)  Lisinopril 20 Mg Tabs (Lisinopril) .... Take 1 Tablet By Mouth Once A Day 3)  Zoloft 50 Mg  Tabs (Sertraline Hcl) .Marland Kitchen.. 1 Tablet Daily 4)  Aspirin 325 Mg  Tabs (Aspirin) .... Take 1 Tablet By Mouth Once A Day 5)  Anusol-Hc 25 Mg Supp (Hydrocortisone Acetate) .... One Supp Per Rectum Three Times A Day 6)  Vitamin B-12 1000 Mcg Tabs (Cyanocobalamin) .... Take One By Mouth Daily 7)  Multivitamins   Tabs (Multiple Vitamin) .... Take One By Mouth Daily 8)  Vitamin C 1000 Mg Tabs (Ascorbic Acid) .... Take One By Mouth Daily  Allergies: No Known Drug Allergies  Past History:  Past Medical History: Last updated: 02/17/2007 Depression: 07/1993 Hyperlipidemia 02/1996 Hypertension: pre 08/25/1996 Hypothyroidism: 07/1993  Past Surgical History: Last updated: 02/17/2007 APPY 2YOA MI S/P CAVERJECT INJECTION  02/1996 CABG X 2 EF 30%  02/1996 Stress Cardiolyte ef 30% : 03/1996 Sleep Study / SEVERE SLEEP APNEA 88 EVENTS PER HOUR O2 SATS DOWN TO 50%? 01/03/2006  Family History: Last updated: Sep 08, 2010 Father:DECEASED 74 YOA ,STROKE  Mother: A  96  HTN, PARKINSON'S DEMENTIA Siblings: 4 BROTHERS 1 DECEASED /TRAUMA/ 3 ALIVE  BROTHER A 76 CAD HTN CHOL BROTHER A 74 BROTHER A 69 CAD  SISTER A 71 DEPRESSION ANXIETY FAMILY HISTORY DONE 1997 CV: DW+ GF DECEASED MI +BROTHER CAD HBP: + SELF, BROTHER OLDEST DM: + SELF GOUT/ARTHRITIS: NEGATIVE PROSTATE/CANCER: NEGATIVE BREAST/OVARIAN/UTERINE CANCER: NEGATIVE DEPRESSION: NEGATIVE ETOH/DRUG ABUSE: NEGATIVE OTHER: + STROKE FATHER  Social History: Last updated: 07/01/2010 Marital Status: MarriedREMARRIED LIVES WITH WIFE Children: 2 STEP CHILDREN, 2 granddaughters 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.   no tob no etoh  Risk Factors: Alcohol Use: <1 Rare (2010-09-08) Caffeine Use: 2 (09/08/2010) Exercise: no (08-Sep-2010)  Risk Factors:  Smoking Status: quit (08/28/2010) Packs/Day: 02/1996 25 PYH (08/28/2010)  Family History: Father:DECEASED 73 YOA ,STROKE  Mother: A  96  HTN, PARKINSON'S DEMENTIA Siblings: 4 BROTHERS 1 DECEASED /TRAUMA/ 3 ALIVE  BROTHER A 75 CAD HTN CHOL BROTHER A 74 BROTHER A 69 CAD  SISTER A 71 DEPRESSION ANXIETY FAMILY HISTORY DONE 1997 CV: DW+ GF DECEASED MI +BROTHER CAD HBP: + SELF, BROTHER OLDEST DM: + SELF GOUT/ARTHRITIS:  NEGATIVE PROSTATE/CANCER: NEGATIVE BREAST/OVARIAN/UTERINE CANCER: NEGATIVE DEPRESSION: NEGATIVE ETOH/DRUG ABUSE: NEGATIVE OTHER: + STROKE FATHER  Review of Systems General:  Denies chills, fatigue, fever, sweats, weakness, and weight loss. Eyes:  Denies blurring, discharge, and eye pain; Recent detached retina.. ENT:  Denies decreased hearing, earache, hoarseness, and ringing in ears. CV:  Complains of shortness of breath with exertion; denies chest pain or discomfort, fainting, fatigue, palpitations, swelling of feet, and swelling of hands; rare. Resp:  Denies cough, shortness of breath, and wheezing. GI:  Complains of bloody stools; denies abdominal pain, change in bowel habits, constipation, dark tarry stools, diarrhea, excessive appetite, gas, hemorrhoids, indigestion, loss of appetite, nausea, vomiting, vomiting blood, and yellowish skin color; occas. GU:  Denies discharge, dysuria, nocturia, and urinary frequency. MS:  Complains of joint pain; denies low back pain, muscle aches, and cramps; left wrist occas.. Derm:  Denies dryness, itching, and rash. Neuro:  Denies numbness, poor balance, tingling, and tremors.  Physical Exam  General:  Well-developed,well-nourished,in no acute distress; alert,appropriate and cooperative throughout examination, mildly overweight. Head:  Normocephalic and atraumatic without obvious abnormalities. No apparent alopecia or balding. Sinuses non tender. Eyes:  Conjunctiva clear bilaterally.  Ears:  External ear exam shows no significant lesions or deformities.  Otoscopic examination reveals clear canals, tympanic membranes are intact bilaterally without bulging, retraction, inflammation or discharge. Hearing is grossly normal bilaterally. Nose:  External nasal examination shows no deformity or inflammation. Nasal mucosa are pink and moist without lesions or exudates. Mouth:  Oral mucosa and oropharynx without lesions or exudates.  Teeth in good  repair. Neck:  No deformities, masses, or tenderness noted. Chest Wall:  No deformities, masses, tenderness or gynecomastia noted. Breasts:  No masses or gynecomastia noted Lungs:  Normal respiratory effort, chest expands symmetrically. Lungs are clear to auscultation, no crackles or wheezes. Heart:  Normal rate and regular rhythm. S1 and S2 normal without gallop, murmur, click, rub or other extra sounds. Abdomen:  Distention linearly, vertically of the abdomen, midline immediately above the umbilicus, no bowel sounds heard in the area and no overt defect felt in the midline. Rectal:  No external abnormalities noted. Normal sphincter tone. No rectal masses or tenderness. G neg. Genitalia:  Testes bilaterally descended without nodularity, tenderness or masses. No scrotal masses or lesions. No penis lesions or urethral discharge. Prostate:  Prostate gland firm and smooth, no enlargement, nodularity, tenderness, mass, asymmetry or induration. 20-30 gms. Msk:  No deformity or scoliosis noted of thoracic or lumbar spine.   Pulses:  R and L carotid,radial,femoral,dorsalis pedis and posterior tibial pulses are full and equal bilaterally Extremities:  No clubbing, cyanosis, edema, or deformity noted with normal full range of motion of all joints.   Neurologic:  No cranial nerve deficits noted. Station and gait are normal. Sensory, motor and coordinative functions appear intact. Skin:  Intact without suspicious lesions or rashes Cervical Nodes:  No lymphadenopathy noted Inguinal Nodes:  No significant adenopathy Psych:  Cognition and judgment appear intact. Alert and cooperative with normal attention span and concentration. No apparent delusions, illusions, hallucinations  Impression & Recommendations:  Problem # 1:  HEALTH MAINTENANCE EXAM (ICD-V70.0)  Reviewed preventive care protocols, scheduled due services, and updated immunizations.  Problem # 2:  VENTRAL HERNIA, ASYMPTOMATIC  (ICD-553.20) Assessment: Unchanged Discussed exercises to attempt to strengthen.  Problem # 3:  LIVER FUNCTION TESTS, ABNORMAL (ICD-794.8) Assessment: Improved  Normalized this year.  Problem # 4:  SPECIAL SCREENING MALIGNANT NEOPLASM OF PROSTATE (ICD-V76.44) Assessment: Unchanged Stable PSA and exam.  Problem # 5:  INFARCTION, MYOCARDIAL, OLD (ICD-412) Assessment: Unchanged  Appears stable. Weight loss by diet and exercise suggested. LDL minimally elevated asnd within realistic reach. His updated medication list for this problem includes:    Lisinopril 20 Mg Tabs (Lisinopril) .Marland Kitchen... Take 1 tablet by mouth once a day    Aspirin 325 Mg Tabs (Aspirin) .Marland Kitchen... Take 1 tablet by mouth once a day  Labs Reviewed: Chol: 177 (08/26/2010)   HDL: 39.00 (08/26/2010)   LDL: 102 (08/26/2010)   TG: 180.0 (08/26/2010)  Problem # 6:  HYPOTHYROIDISM (ICD-244.9) Assessment: Unchanged Stable. Cont curr therapeutic dose. His updated medication list for this problem includes:    Synthroid 175 Mcg Tabs (Levothyroxine sodium) .Marland Kitchen... 1 tablet by mouth once a day  Labs Reviewed: TSH: 0.75 (08/26/2010)    HgBA1c: 5.9 (12/28/2002) Chol: 177 (08/26/2010)   HDL: 39.00 (08/26/2010)   LDL: 102 (08/26/2010)   TG: 180.0 (08/26/2010)  Problem # 7:  HYPERTENSION (ICD-401.9) Assessment: Unchanged Stable. Cont curr meds. His updated medication list for this problem includes:    Lisinopril 20 Mg Tabs (Lisinopril) .Marland Kitchen... Take 1 tablet by mouth once a day  BP today: 110/74 Prior BP: 122/80 (08/13/2010)  Labs Reviewed: K+: 5.0 (08/26/2010) Creat: : 0.9 (08/26/2010)   Chol: 177 (08/26/2010)   HDL: 39.00 (08/26/2010)   LDL: 102 (08/26/2010)   TG: 180.0 (08/26/2010)  Problem # 8:  HYPERLIPIDEMIA (ICD-272.4) Assessment: Unchanged Within reach via diet and exercise.  Problem # 9:  DEPRESSION,RECURRENT (ICD-311) Assessment: Unchanged Doing well but understands that weight is related to his medication and that his  percieved tiredness is probably also related. Discussed effect of Lexapro and Celexa probably less in that regard but are probably more expensive. He will investigate. His updated medication list for this problem includes:    Zoloft 50 Mg Tabs (Sertraline hcl) .Marland Kitchen... 1 tablet daily  Problem # 10:  HAND PAIN, LEFT (ICD-729.5) Assessment: New Probably early osteo or "reactive" arthritis....if becomes more of a problem, try Hylauronic Acid.  Complete Medication List: 1)  Synthroid 175 Mcg Tabs (Levothyroxine sodium) .Marland Kitchen.. 1 tablet by mouth once a day 2)  Lisinopril 20 Mg Tabs (Lisinopril) .... Take 1 tablet by mouth once a day 3)  Zoloft 50 Mg Tabs (Sertraline hcl) .Marland Kitchen.. 1 tablet daily 4)  Aspirin 325 Mg Tabs (Aspirin) .... Take 1 tablet by mouth once a day 5)  Vitamin B-12 1000 Mcg Tabs (Cyanocobalamin) .... Take one by mouth daily 6)  Multivitamins Tabs (Multiple vitamin) .... Take one by mouth daily 7)  Vitamin C 1000 Mg Tabs (Ascorbic acid) .... Take one by mouth daily  Patient Instructions: 1)  If wrist pain becomes routine, take Hyaluronic Acid daily.  2)  Look into Celexa and Lexapro.   Orders Added: 1)  Est. Patient 40-64 years [[id number]]    Current Allergies (reviewed today): No known allergies

## 2010-11-27 NOTE — Progress Notes (Signed)
Summary: LISINOPRIL  Phone Note Refill Request Message from:  [facility]  on June 11, 2010 10:11 AM  Refills Requested: Medication #1:  LISINOPRIL 20 MG TABS Take 1 tablet by mouth once a day Form on your desk , Hetty Ely pt   Method Requested: Fax to [facility] Initial call taken by: Mervin Hack CMA Newel Oien Dull),  June 11, 2010 10:12 AM  Follow-up for Phone Call        Signed.   Please have patient schedule CPE for 10/11.  Needs fasting cmet/lipid 401.1  and PSA v76.44 before visit.  Follow-up by: Crawford Givens MD,  June 11, 2010 2:57 PM  Additional Follow-up for Phone Call Additional follow up Details #1::        Faxed form.  Left message on voicemail  in detail to schedule appt for CPX and lab visit .  Personalized VM. Additional Follow-up by: Delilah Shan CMA Toshiyuki Fredell Dull),  June 11, 2010 3:13 PM    Prescriptions: LISINOPRIL 20 MG TABS (LISINOPRIL) Take 1 tablet by mouth once a day  #90 x 3   Entered by:   Delilah Shan CMA (AAMA)   Authorized by:   Crawford Givens MD   Signed by:   Delilah Shan CMA (AAMA) on 06/11/2010   Method used:   Historical   RxID:   [id number]

## 2010-11-27 NOTE — Progress Notes (Signed)
Summary: synthroid  Phone Note Refill Request Message from:  Patient on May 19, 2010 4:21 PM  Refills Requested: Medication #1:  SYNTHROID 175 MCG TABS 1 tablet by mouth once a day [facility]   Initial call taken by: Melody Comas,  May 19, 2010 4:22 PM Caller: Patient Call For: Shaune Leeks MD    Prescriptions: SYNTHROID 175 MCG TABS (LEVOTHYROXINE SODIUM) 1 tablet by mouth once a day  #90 x 1   Entered by:   Lowella Petties CMA   Authorized by:   Crawford Givens MD   Signed by:   Lowella Petties CMA on 05/19/2010   Method used:   [facility] to        [facility] [phone]* (retail)       [address]       [place], [place]  [address]       Ph: [phone]       Fax: [phone]   RxID:   [id number]

## 2010-11-27 NOTE — Assessment & Plan Note (Signed)
Summary: OCT 4 FOLLOW UP/[facility]   Vital Signs:  Patient profile:   60 year old male Weight:      202 pounds Temp:     98.7 degrees F oral Pulse rate:   80 / minute Pulse rhythm:   regular BP sitting:   108 / 78  (left arm) Cuff size:   large  Vitals Entered By: Sydell Axon LPN (July 29, 2009 3:37 PM) CC: 4 Month follow-up   History of Present Illness: Pt here for f/u of blood per rectum felt to be due to rectal  irritation. I saw him 2 weeks ago and prescribed Anusol at his Comp Exam. He has had no blood since the day after starting the Anusol. He does have rectal irritation however with itching and burning. His BMs are normal, formed and reasonably soft. He also wonders about agitation and having a shorter fuse than usual. He wondered if Sertraline could be doing that. We discussed that Sertraline is often used for agitation altho he could be having and unusual reaction.  Problems Prior to Update: 1)  Rectal Bleeding  (ICD-569.3) 2)  Health Maintenance Exam  (ICD-V70.0) 3)  Liver Function Tests, Abnormal  (ICD-794.8) 4)  Fatigue  (ICD-780.79) 5)  Special Screening Malignant Neoplasm of Prostate  (ICD-V76.44) 6)  Sprain/strain, Sacroiliac Site Nos  (ICD-846.9) 7)  Infarction, Myocardial, Old  (ICD-412) 8)  Plantar Fasciitis  (ICD-728.71) 9)  Hematuria, Microscopic, Hx of (HUMPHRIES)  (ICD-V13.09) 10)  Cad  (ICD-414.00) 11)  Hypothyroidism  (ICD-244.9) 12)  Hypertension  (ICD-401.9) 13)  Hyperlipidemia  (ICD-272.4) 14)  Depression,recurrent  (ICD-311)  Medications Prior to Update: 1)  Synthroid 175 Mcg Tabs (Levothyroxine Sodium) .Marland Kitchen.. 1 Tablet By Mouth Once A Day 2)  Lisinopril 20 Mg Tabs (Lisinopril) .... Take 1 Tablet By Mouth Once A Day 3)  Zoloft 50 Mg  Tabs (Sertraline Hcl) .Marland Kitchen.. 1 Tablet Daily 4)  Aspirin 325 Mg  Tabs (Aspirin) .... Take 1 Tablet By Mouth Once A Day 5)  Vitamin D (Ergocalciferol) 50000 Unit Caps (Ergocalciferol) .... One Tab By Mouth Weekly. 6)   Anusol-Hc 25 Mg Supp (Hydrocortisone Acetate) .... One Supp Per Rectum Three Times A Day  Allergies: No Known Drug Allergies  Physical Exam  General:  Well-developed,well-nourished,in no acute distress; alert,appropriate and cooperative throughout examination. Mildly obese. Head:  Normocephalic and atraumatic without obvious abnormalities. No apparent alopecia or balding. Sinuses non tender. Eyes:  Conjunctiva clear bilaterally.  Ears:  External ear exam shows no significant lesions or deformities.  Otoscopic examination reveals clear canals, tympanic membranes are intact bilaterally without bulging, retraction, inflammation or discharge. Hearing is grossly normal bilaterally. Nose:  External nasal examination shows no deformity or inflammation. Nasal mucosa are pink and moist without lesions or exudates. Mouth:  Oral mucosa and oropharynx without lesions or exudates.  Teeth in good repair. White PND. Neck:  No deformities, masses, or tenderness noted. Chest Wall:  No deformities, masses, tenderness or gynecomastia noted. Lungs:  Normal respiratory effort, chest expands symmetrically. Lungs are clear to auscultation, no crackles or wheezes. Heart:  Normal rate and regular rhythm. S1 and S2 normal without gallop, murmur, click, rub or other extra sounds. Rectal:  Not repeated today.   Impression & Recommendations:  Problem # 1:  RECTAL BLEEDING (ICD-569.3) Assessment Improved Basically resolved for almost two weeks. Cont Anusol as needed, sitz baths and activity with exercise and lots of fluids.  If sees blood again, RTC. Try washing after each BM and rinsing  well with drying of the rectum to avoid irritation.  Problem # 2:  DEPRESSION,RECURRENT (ICD-311) Assessment: Unchanged  Stable. Do not think agitation is coming from the medication but if continues, will change it. His updated medication list for this problem includes:    Zoloft 50 Mg Tabs (Sertraline hcl) .Marland Kitchen... 1 tablet daily   Problem # 3:  HYPERTENSION (ICD-401.9) Assessment: Unchanged Stable. Cont meds. His updated medication list for this problem includes:    Lisinopril 20 Mg Tabs (Lisinopril) .Marland Kitchen... Take 1 tablet by mouth once a day  BP today: 108/78 Prior BP: 108/64 (07/17/2009)  Labs Reviewed: K+: 4.9 (07/15/2009) Creat: : 0.8 (07/15/2009)   Chol: 156 (07/15/2009)   HDL: 34.40 (07/15/2009)   LDL: 94 (07/15/2009)   TG: 138.0 (07/15/2009)  Complete Medication List: 1)  Synthroid 175 Mcg Tabs (Levothyroxine sodium) .Marland Kitchen.. 1 tablet by mouth once a day 2)  Lisinopril 20 Mg Tabs (Lisinopril) .... Take 1 tablet by mouth once a day 3)  Zoloft 50 Mg Tabs (Sertraline hcl) .Marland Kitchen.. 1 tablet daily 4)  Aspirin 325 Mg Tabs (Aspirin) .... Take 1 tablet by mouth once a day 5)  Vitamin D (ergocalciferol) 50000 Unit Caps (Ergocalciferol) .... One tab by mouth weekly. 6)  Anusol-hc 25 Mg Supp (Hydrocortisone acetate) .... One supp per rectum three times a day  Patient Instructions: 1)  RTC as directed or as needed.  Current Allergies (reviewed today): No known allergies

## 2010-11-27 NOTE — Assessment & Plan Note (Signed)
Summary: CPX / LFW   Vital Signs:  Patient profile:   60 year old male Height:      65 inches Weight:      203 pounds Temp:     98.8 degrees F oral Pulse rate:   76 / minute Pulse rhythm:   regular BP sitting:   108 / 64  (left arm) Cuff size:   large  Vitals Entered By: Sydell Axon LPN (2009-08-13 2:01 PM) CC: 30 Minute check-up   History of Present Illness: Pt here for Comp Exam. He is having problems with his rectum, having flares of bleeding and itching...using hydrocortisone cream. He otherwise is  having lack of energy.  Preventive Screening-Counseling & Management  Alcohol-Tobacco     Alcohol drinks/day: 0     Smoking Status: quit     Packs/Day: 02/1996 25 PYH  Caffeine-Diet-Exercise     Caffeine use/day: 2     Does Patient Exercise: no  Problems Prior to Update: 1)  Liver Function Tests, Abnormal  (ICD-794.8) 2)  Fatigue  (ICD-780.79) 3)  Special Screening Malignant Neoplasm of Prostate  (ICD-V76.44) 4)  Sprain/strain, Sacroiliac Site Nos  (ICD-846.9) 5)  Infarction, Myocardial, Old  (ICD-412) 6)  Plantar Fasciitis  (ICD-728.71) 7)  Hematuria, Microscopic, Hx of (HUMPHRIES)  (ICD-V13.09) 8)  Cad  (ICD-414.00) 9)  Hypothyroidism  (ICD-244.9) 10)  Hypertension  (ICD-401.9) 11)  Hyperlipidemia  (ICD-272.4) 12)  Depression,recurrent  (ICD-311)  Medications Prior to Update: 1)  Synthroid 175 Mcg Tabs (Levothyroxine Sodium) .Marland Kitchen.. 1 Tablet By Mouth Once A Day 2)  Lisinopril 20 Mg Tabs (Lisinopril) .... Take 1 Tablet By Mouth Once A Day 3)  Zoloft 50 Mg  Tabs (Sertraline Hcl) .Marland Kitchen.. 1 Tablet Daily 4)  Aspirin 325 Mg  Tabs (Aspirin) .... Take 1 Tablet By Mouth Once A Day  Allergies: No Known Drug Allergies  Past History:  Past Medical History: Last updated: 02/17/2007 Depression: 07/1993 Hyperlipidemia 02/1996 Hypertension: pre 08/25/1996 Hypothyroidism: 07/1993  Past Surgical History: Last updated: 02/17/2007 APPY 2YOA MI S/P CAVERJECT INJECTION   02/1996 CABG X 2 EF 30%  02/1996 Stress Cardiolyte ef 30% : 03/1996 Sleep Study / SEVERE SLEEP APNEA 88 EVENTS PER HOUR O2 SATS DOWN TO 50%? 01/03/2006  Family History: Last updated: 08/13/2009 Father:DECEASED 58 YOA ,STROKE  Mother: A  95  HTN, PARKINSON'S DEMENTIA Siblings: 4 BROTHERS 1 DECEASED /TRAUMA/ 3 ALIVE  BROTHER A 75 CAD BROTHER A 73 BROTHER A 68 CAD  SISTER A 70 DEPRESSION ANXIETY FAMILY HISTORY DONE 1997 CV: DW+ GF DECEASED MI +BROTHER CAD HBP: + SELF, BROTHER OLDEST DM: + SELF GOUT/ARTHRITIS: NEGATIVE PROSTATE/CANCER: NEGATIVE BREAST/OVARIAN/UTERINE CANCER: NEGATIVE DEPRESSION: NEGATIVE ETOH/DRUG ABUSE: NEGATIVE OTHER: + STROKE FATHER  Social History: Last updated: 02/17/2007 Marital Status: MarriedREMARRIED LIVES WITH WIFE Children: 2 STEP CHILDREN 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 [employer]  Risk Factors: Alcohol Use: 0 (2009/08/13) Caffeine Use: 2 (2009/08/13) Exercise: no (08/13/09)  Risk Factors: Smoking Status: quit (2009-08-13) Packs/Day: 02/1996 25 PYH (2009/08/13)  Family History: Father:DECEASED 72 YOA ,STROKE  Mother: A  95  HTN, PARKINSON'S DEMENTIA Siblings: 4 BROTHERS 1 DECEASED /TRAUMA/ 3 ALIVE  BROTHER A 75 CAD BROTHER A 73 BROTHER A 68 CAD  SISTER A 70 DEPRESSION ANXIETY FAMILY HISTORY DONE 1997 CV: DW+ GF DECEASED MI +BROTHER CAD HBP: + SELF, BROTHER OLDEST DM: + SELF GOUT/ARTHRITIS: NEGATIVE PROSTATE/CANCER: NEGATIVE BREAST/OVARIAN/UTERINE CANCER: NEGATIVE DEPRESSION: NEGATIVE ETOH/DRUG ABUSE: NEGATIVE OTHER: + STROKE FATHER  Social History: Caffeine use/day:  2 Does Patient Exercise:  no  Review of Systems General:  Complains of fatigue; denies chills, fever, loss of appetite, malaise, sleep disorder, sweats, weakness, and weight loss. Eyes:  Denies blurring, discharge, double vision, eye irritation, eye pain, halos, itching, light sensitivity, red eye, vision loss-1 eye, and  vision loss-both eyes; CATTARACTS BILAT Good result. ENT:  Denies decreased hearing, difficulty swallowing, ear discharge, earache, hoarseness, nasal congestion, nosebleeds, postnasal drainage, ringing in ears, sinus pressure, and sore throat. CV:  Denies bluish discoloration of lips or nails, chest pain or discomfort, difficulty breathing at night, difficulty breathing while lying down, fainting, fatigue, leg cramps with exertion, lightheadness, near fainting, palpitations, shortness of breath with exertion, swelling of feet, swelling of hands, and weight gain. Resp:  Denies chest discomfort, chest pain with inspiration, cough, coughing up blood, excessive snoring, hypersomnolence, morning headaches, pleuritic, shortness of breath, sputum productive, and wheezing. GI:  Complains of bloody stools; denies abdominal pain, change in bowel habits, constipation, dark tarry stools, diarrhea, excessive appetite, gas, hemorrhoids, indigestion, loss of appetite, nausea, vomiting, vomiting blood, and yellowish skin color; occas diarrhea, blood via rectum...see HPI. GU:  Denies decreased libido, discharge, dysuria, erectile dysfunction, genital sores, hematuria, incontinence, nocturia, urinary frequency, and urinary hesitancy. MS:  Denies joint pain, joint redness, joint swelling, loss of strength, low back pain, mid back pain, muscle aches, muscle , cramps, muscle weakness, stiffness, and thoracic pain; occas shoulder problems. Derm:  Denies changes in color of skin, changes in nail beds, dryness, excessive perspiration, flushing, hair loss, insect bite(s), itching, lesion(s), poor wound healing, and rash. Neuro:  Denies brief paralysis, difficulty with concentration, disturbances in coordination, falling down, headaches, inability to speak, memory loss, numbness, poor balance, seizures, sensation of room spinning, tingling, tremors, visual disturbances, and weakness.  Physical Exam  General:   Well-developed,well-nourished,in no acute distress; alert,appropriate and cooperative throughout examination. Mildly obese. Head:  Normocephalic and atraumatic without obvious abnormalities. No apparent alopecia or balding. Sinuses non tender. Eyes:  Conjunctiva clear bilaterally.  Ears:  External ear exam shows no significant lesions or deformities.  Otoscopic examination reveals clear canals, tympanic membranes are intact bilaterally without bulging, retraction, inflammation or discharge. Hearing is grossly normal bilaterally. Nose:  External nasal examination shows no deformity or inflammation. Nasal mucosa are pink and moist without lesions or exudates. Mouth:  Oral mucosa and oropharynx without lesions or exudates.  Teeth in good repair. White PND. Neck:  No deformities, masses, or tenderness noted. Chest Wall:  No deformities, masses, tenderness or gynecomastia noted. Breasts:  No masses or gynecomastia noted Lungs:  Normal respiratory effort, chest expands symmetrically. Lungs are clear to auscultation, no crackles or wheezes. Heart:  Normal rate and regular rhythm. S1 and S2 normal without gallop, murmur, click, rub or other extra sounds. Abdomen:  Bowel sounds positive,abdomen soft and non-tender without masses, organomegaly or hernias noted. Mildly protuberant. Rectal:  No external abnormalities noted. Normal sphincter tone. No rectal masses or tenderness. No hemms fissures seen. Genitalia:  Testes bilaterally descended without nodularity, tenderness or masses. No scrotal masses or lesions. No penis lesions or urethral discharge. Prostate:  Prostate gland firm and smooth, no enlargement, nodularity, tenderness, mass, asymmetry or induration. 20-30 gms. Msk:  No deformity or scoliosis noted of thoracic or lumbar spine.   Pulses:  R and L carotid,radial,femoral,dorsalis pedis and posterior tibial pulses are full and equal bilaterally Extremities:  No clubbing, cyanosis, edema, or deformity  noted with normal full range of motion of all joints.  Neurologic:  No cranial nerve deficits noted. Station and gait are normal. Plantar reflexes are down-going bilaterally. DTRs are symmetrical throughout. Sensory, motor and coordinative functions appear intact. Skin:  Intact without suspicious lesions or rashes Cervical Nodes:  No lymphadenopathy noted Inguinal Nodes:  No significant adenopathy Psych:  Cognition and judgment appear intact. Alert and cooperative with normal attention span and concentration. No apparent delusions, illusions, hallucinations   Impression & Recommendations:  Problem # 1:  HEALTH MAINTENANCE EXAM (ICD-V70.0) Assessment Comment Only  Problem # 2:  LIVER FUNCTION TESTS, ABNORMAL (ICD-794.8) Assessment: Improved  normalized.  Problem # 3:  FATIGUE (ICD-780.79) Assessment: Unchanged Vit D low, will replace. TSH low, will hold off decreasing replacement.  Problem # 4:  SPECIAL SCREENING MALIGNANT NEOPLASM OF PROSTATE (ICD-V76.44) Assessment: Unchanged Stable PSA and exam.  Problem # 5:  INFARCTION, MYOCARDIAL, OLD (ICD-412) Assessment: Unchanged Will need cardiology followup sometime. He wants to hold off. His updated medication list for this problem includes:    Lisinopril 20 Mg Tabs (Lisinopril) .Marland Kitchen... Take 1 tablet by mouth once a day    Aspirin 325 Mg Tabs (Aspirin) .Marland Kitchen... Take 1 tablet by mouth once a day  Problem # 6:  HYPOTHYROIDISM (ICD-244.9) Assessment: Unchanged Overcontrolled. Hold off adjustment. His updated medication list for this problem includes:    Synthroid 175 Mcg Tabs (Levothyroxine sodium) .Marland Kitchen... 1 tablet by mouth once a day  Labs Reviewed: TSH: 0.24 (07/15/2009)    HgBA1c: 5.9 (12/28/2002) Chol: 156 (07/15/2009)   HDL: 34.40 (07/15/2009)   LDL: 94 (07/15/2009)   TG: 138.0 (07/15/2009)  Problem # 7:  HYPERTENSION (ICD-401.9) Assessment: Unchanged  His updated medication list for this problem includes:    Lisinopril 20 Mg  Tabs (Lisinopril) .Marland Kitchen... Take 1 tablet by mouth once a day  BP today: 108/64 Prior BP: 102/70 (04/18/2009)  Labs Reviewed: K+: 4.9 (07/15/2009) Creat: : 0.8 (07/15/2009)   Chol: 156 (07/15/2009)   HDL: 34.40 (07/15/2009)   LDL: 94 (07/15/2009)   TG: 138.0 (07/15/2009)  Problem # 8:  HYPERLIPIDEMIA (ICD-272.4) Assessment: Unchanged Adequate on no meds....may need stablization in future. Labs Reviewed: SGOT: 27 (07/15/2009)   SGPT: 37 (07/15/2009)   HDL:34.40 (07/15/2009), 44.3 (10/31/2007)  LDL:94 (07/15/2009), 115 (16/07/9603)  Chol:156 (07/15/2009), 180 (10/31/2007)  Trig:138.0 (07/15/2009), 106 (10/31/2007)  Problem # 9:  DEPRESSION,RECURRENT (ICD-311) Assessment: Unchanged Doing ok, cont 50 of Zoloft. His updated medication list for this problem includes:    Zoloft 50 Mg Tabs (Sertraline hcl) .Marland Kitchen... 1 tablet daily  Problem # 10:  RECTAL BLEEDING (ICD-569.3) Assessment: New Trial of Anusol. Sitz baths. Recheck Mon a week.  Complete Medication List: 1)  Synthroid 175 Mcg Tabs (Levothyroxine sodium) .Marland Kitchen.. 1 tablet by mouth once a day 2)  Lisinopril 20 Mg Tabs (Lisinopril) .... Take 1 tablet by mouth once a day 3)  Zoloft 50 Mg Tabs (Sertraline hcl) .Marland Kitchen.. 1 tablet daily 4)  Aspirin 325 Mg Tabs (Aspirin) .... Take 1 tablet by mouth once a day 5)  Vitamin D (ergocalciferol) 50000 Unit Caps (Ergocalciferol) .... One tab by mouth weekly. 6)  Anusol-hc 25 Mg Supp (Hydrocortisone acetate) .... One supp per rectum three times a day  Patient Instructions: 1)  RTC 4 Oct, anoscopy if rectal bleeding not resolved. 2)  Recheck TSH in future with Vit D. Prescriptions: ANUSOL-HC 25 MG SUPP (HYDROCORTISONE ACETATE) one supp per rectum three times a day  #30 x 0   Entered and Authorized by:   Shaune Leeks MD   Signed  by:   Shaune Leeks MD on 07/17/2009   Method used:   [facility] to        [facility] [address] [id number]       [address]       [place], [place]  [address]       Ph: [phone]       Fax: [phone]   RxID:   [id number] VITAMIN D (ERGOCALCIFEROL) 50000 UNIT CAPS (ERGOCALCIFEROL) one tab by mouth weekly.  #4 x 2   Entered and Authorized by:   Shaune Leeks MD   Signed by:   Shaune Leeks MD on 07/17/2009   Method used:   [facility]        [facility] [address] [id number]       [address]       [place], [place]  [address]       Ph: [phone]       Fax: [phone]   RxID:   [id number]   Current Allergies (reviewed today): No known allergies

## 2010-11-27 NOTE — Assessment & Plan Note (Signed)
Summary: ?HERNIA/CLE   Vital Signs:  Patient profile:   60 year old male Weight:      198.4 pounds Temp:     98.5 degrees F oral Pulse rate:   84 / minute Pulse rhythm:   regular BP sitting:   122 / 80  (left arm) Cuff size:   large  Vitals Entered By: Sydell Axon LPN (August 13, 2010 3:13 PM) CC: ? Hernia, abdomen area, had surgery a few weeks back because of a detached retina and the hernia was noticed by the nurse after his eye surgery   History of Present Illness: Pt here for having been seen by the nurse at [facility] while getting off the operating table and having distention of the lower abd vertically that he had never had before. He denies sxs, pain or bowel habit changes.  He isw frustrated that he continues to need Zoloft. he continues to deny SI/HI.  Problems Prior to Update: 1)  Rectal Bleeding  (ICD-569.3) 2)  Health Maintenance Exam  (ICD-V70.0) 3)  Liver Function Tests, Abnormal  (ICD-794.8) 4)  Fatigue  (ICD-780.79) 5)  Special Screening Malignant Neoplasm of Prostate  (ICD-V76.44) 6)  Sprain/strain, Sacroiliac Site Nos  (ICD-846.9) 7)  Infarction, Myocardial, Old  (ICD-412) 8)  Plantar Fasciitis  (ICD-728.71) 9)  Hematuria, Microscopic, Hx of (HUMPHRIES)  (ICD-V13.09) 10)  Cad  (ICD-414.00) 11)  Hypothyroidism  (ICD-244.9) 12)  Hypertension  (ICD-401.9) 13)  Hyperlipidemia  (ICD-272.4) 14)  Depression,recurrent  (ICD-311)  Medications Prior to Update: 1)  Synthroid 175 Mcg Tabs (Levothyroxine Sodium) .Marland Kitchen.. 1 Tablet By Mouth Once A Day 2)  Lisinopril 20 Mg Tabs (Lisinopril) .... Take 1 Tablet By Mouth Once A Day 3)  Zoloft 50 Mg  Tabs (Sertraline Hcl) .Marland Kitchen.. 1 Tablet Daily 4)  Aspirin 325 Mg  Tabs (Aspirin) .... Take 1 Tablet By Mouth Once A Day 5)  Anusol-Hc 25 Mg Supp (Hydrocortisone Acetate) .... One Supp Per Rectum Three Times A Day  Allergies: No Known Drug Allergies  Physical Exam  General:  Well-developed,well-nourished,in no acute  distress; alert,appropriate and cooperative throughout examination Head:  Normocephalic and atraumatic without obvious abnormalities. No apparent alopecia or balding. Sinuses non tender. Eyes:  Conjunctiva clear bilaterally.  Ears:  External ear exam shows no significant lesions or deformities.  Otoscopic examination reveals clear canals, tympanic membranes are intact bilaterally without bulging, retraction, inflammation or discharge. Hearing is grossly normal bilaterally. Nose:  External nasal examination shows no deformity or inflammation. Nasal mucosa are pink and moist without lesions or exudates. Mouth:  Oral mucosa and oropharynx without lesions or exudates.  Teeth in good repair. White PND. Abdomen:  Distention linearly, vertically of the abdomen, midline immediately above the umbilicus, no bowel sounds heard in the area and no overt defect felt in the midline.   Impression & Recommendations:  Problem # 1:  VENTRAL HERNIA, ASYMPTOMATIC (ICD-553.20) Assessment New Due to the size of the swelling with abd pressure and the fact that this is new, will refer to Gen Surg to assess need for repair. We discussed  definition of and risk of hernias in general and his case in particular. Orders: Surgical Referral (Surgery)  Problem # 2:  DEPRESSION,RECURRENT (ICD-311) Assessment: Improved Now seems well controlled again. Discussed the fact that he is fortunate to live at a time when there is a medicine available that successfully controls and helps his sxs. His updated medication list for this problem includes:    Zoloft 50 Mg Tabs (  Sertraline hcl) .Marland Kitchen... 1 tablet daily  Complete Medication List: 1)  Synthroid 175 Mcg Tabs (Levothyroxine sodium) .Marland Kitchen.. 1 tablet by mouth once a day 2)  Lisinopril 20 Mg Tabs (Lisinopril) .... Take 1 tablet by mouth once a day 3)  Zoloft 50 Mg Tabs (Sertraline hcl) .Marland Kitchen.. 1 tablet daily 4)  Aspirin 325 Mg Tabs (Aspirin) .... Take 1 tablet by mouth once a day 5)   Anusol-hc 25 Mg Supp (Hydrocortisone acetate) .... One supp per rectum three times a day 6)  Vitamin B-12 1000 Mcg Tabs (Cyanocobalamin) .... Take one by mouth daily 7)  Multivitamins Tabs (Multiple vitamin) .... Take one by mouth daily 8)  Vitamin C 1000 Mg Tabs (Ascorbic acid) .... Take one by mouth daily  Patient Instructions: 1)  Refer to gen surgery. 2)  Keep appt for Comp Exam, script refills sent today. Prescriptions: LISINOPRIL 20 MG TABS (LISINOPRIL) Take 1 tablet by mouth once a day  #90 x 3   Entered and Authorized by:   Shaune Leeks MD   Signed by:   Shaune Leeks MD on 08/13/2010   Method used:   Print then Give to Patient   RxID:   [id number] SYNTHROID 175 MCG TABS (LEVOTHYROXINE SODIUM) 1 tablet by mouth once a day  #90 x 3   Entered and Authorized by:   Shaune Leeks MD   Signed by:   Shaune Leeks MD on 08/13/2010   Method used:   Print then Give to Patient   RxID:   [id number]    Orders Added: 1)  Surgical Referral [Surgery] 2)  Est. Patient Level III [84132]    Current Allergies (reviewed today): No known allergies

## 2010-11-27 NOTE — Progress Notes (Signed)
Summary: head and chest congestion  Phone Note Call from Patient Call back at [phone]   Caller: Patient Call For: Shaune Leeks MD Summary of Call: Pt has been in bed for 5 days with productive cough with green mucus at times a dry cough also. No problem getting breath and no wheezing. No sorethroat, head and chest congestion. Pt wants antibiotic. Pt does not want to come in. Pt said to ask Dr. Hetty Ely for medicine. [facility] [phone]. Please advise.  Initial call taken by: Lewanda Rife LPN,  November 18, 2009 9:21 AM  Follow-up for Phone Call        Don't ususally do this but because appts so tight today: Take Guaifenesin by [facility] or [facility] and getting MUCOUS RELIEF EXPECTORANT (400mg ), take 11/2 tabs by mouth AM and NOON. Drink lots of fluids anytime taking Guaifenesin.  Take Amox 500 mg three times a day. 30/0RF. Come in if no impr. Follow-up by: Shaune Leeks MD,  November 18, 2009 11:05 AM  Additional Follow-up for Phone Call Additional follow up Details #1::        Left message for patient to call back. Lewanda Rife LPN  November 18, 2009 1:09 PM   Patient notified as instructed by telephone. Lewanda Rife LPN  November 18, 2009 1:47 PM     New/Updated Medications: AMOXICILLIN 500 MG TABS (AMOXICILLIN) one tab by mouth three times a day Prescriptions: AMOXICILLIN 500 MG TABS (AMOXICILLIN) one tab by mouth three times a day  #30 x 0   Entered and Authorized by:   Shaune Leeks MD   Signed by:   Shaune Leeks MD on 11/18/2009   Method used:   [facility]        [phone][facility] (retail)       [address]       [place], [place]  [address]       Ph: [phone]       Fax: [phone]   RxID:   [id number]

## 2010-11-27 NOTE — Progress Notes (Signed)
Summary: Rx Zoloft  Phone Note Refill Request Call back at [phone] Message from:  [id number] on April 09, 2009 11:24 AM  Refills Requested: Medication #1:  ZOLOFT 50 MG  TABS 1/2 tablet daily   Last Refilled: 03/13/2009 received e-scribe refill request.   Method Requested: Electronic Initial call taken by: Sydell Axon,  April 09, 2009 11:24 AM  Follow-up for Phone Call        Please heve pt see me within the month. He hasn't been here in over a year. Follow-up by: Shaune Leeks MD,  April 09, 2009 1:55 PM  Additional Follow-up for Phone Call Additional follow up Details #1::        Patient notified as instructed by telephone.  Patient to make an appt. Additional Follow-up by: Sydell Axon,  April 09, 2009 2:32 PM      Prescriptions: ZOLOFT 50 MG  TABS (SERTRALINE HCL) 1/2 tablet daily  #15 x 12   Entered and Authorized by:   Shaune Leeks MD   Signed by:   Shaune Leeks MD on 04/09/2009   Method used:   [facility]        [id number]       [address]       [place], [place]  [address]       Ph: [phone]       Fax: [phone]   RxID:   [id number]

## 2010-11-27 NOTE — Assessment & Plan Note (Signed)
Summary: REFILL ZOLOFT/CLE   Vital Signs:  Patient profile:   60 year old male Height:      65 inches Weight:      197.25 pounds BMI:     32.94 Temp:     98.5 degrees F oral Pulse rate:   80 / minute Pulse rhythm:   regular BP sitting:   108 / 62  (left arm) Cuff size:   large  Vitals Entered By: Delilah Shan CMA Reshunda Strider Dull) (July 01, 2010 4:16 PM) CC: Refill Zoloft   History of Present Illness: Depression.  Prev was on SSRI and had good effect.  Has been more irritable recently.  No SI/HI.  Prev was more depressed before starting the med.  Inc in stress at work with professional changes.  Sleeping okay with CPAP, some early waking.  Had been looking into cognitive therapy.  Prev was on wellbutrin w/o much effect per patient.  Interested in faith based approach to therapy.   Allergies: No Known Drug Allergies  Past History:  Past Medical History: Last updated: 02/17/2007 Depression: 07/1993 Hyperlipidemia 02/1996 Hypertension: pre 08/25/1996 Hypothyroidism: 07/1993  Social History: Reviewed history from 02/17/2007 and no changes required. 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   no tob no etoh  Review of Systems       See HPI.  Otherwise negative.    Physical Exam  General:  GEN: nad, alert and oriented NECK: supple w/o LA CV: rrr.  no murmur PULM: ctab, no inc wob ABD: soft, +bs EXT: no edema SKIN: no acute rash    Impression & Recommendations:  Problem # 1:  DEPRESSION,RECURRENT (ICD-311) phone number given for [facility] counseling in [place].  No change in med in the  meantime.  Contracts for safety.  He agrees with plan.  His updated medication list for this problem includes:    Zoloft 50 Mg Tabs (Sertraline hcl) .Marland Kitchen... 1 tablet daily  Complete Medication List: 1)  Synthroid  175 Mcg Tabs (Levothyroxine sodium) .Marland Kitchen.. 1 tablet by mouth once a day 2)  Lisinopril 20 Mg Tabs (Lisinopril) .... Take 1 tablet by mouth once a day 3)  Zoloft 50 Mg Tabs (Sertraline hcl) .Marland Kitchen.. 1 tablet daily 4)  Aspirin 325 Mg Tabs (Aspirin) .... Take 1 tablet by mouth once a day 5)  Anusol-hc 25 Mg Supp (Hydrocortisone acetate) .... One supp per rectum three times a day  Patient Instructions: 1)  Call about counseling and let me know if anything changes.   Prescriptions: ZOLOFT 50 MG  TABS (SERTRALINE HCL) 1 tablet daily  #90 x 4   Entered and Authorized by:   Crawford Givens MD   Signed by:   Crawford Givens MD on 07/01/2010   Method used:   [facility]        [facility] [address] [phone]* (retail)       [address]       [place], [place]  [address]       Ph: [phone]       Fax: [phone]   RxID:   [id number]   Current Allergies (reviewed today): No known allergies

## 2010-11-27 NOTE — Assessment & Plan Note (Signed)
Summary: REFILL MEDICATION/CLE   Vital Signs:  Patient profile:   60 year old male Height:      65 inches Weight:      203 pounds BMI:     33.90 Temp:     98 degrees F oral Pulse rate:   76 / minute Pulse rhythm:   regular BP sitting:   102 / 70  (left arm) Cuff size:   large  Vitals Entered By: Delilah Shan (April 18, 2009 10:10 AM) CC: Refill medications   History of Present Illness: Pt here for medication refills as he has not been here in over a year. He feels well and has been doing ok. He has gained weight since I have last seen him and was too heavy then! He is stable on the Zoloft altho he doesn't like "having to take a pill for the rest of my life." he has times when he gets down. We discussed increasing his Zoloft and he would like to have the option of increasing to a whole pill. He spends a lot of time at work. He does not exercise and we discussed healthy lifestyle changes and regular exercise.  Problems Prior to Update: 1)  Uri  (ICD-465.9) 2)  Liver Function Tests, Abnormal  (ICD-794.8) 3)  Fatigue  (ICD-780.79) 4)  Special Screening Malignant Neoplasm of Prostate  (ICD-V76.44) 5)  Sprain/strain, Sacroiliac Site Nos  (ICD-846.9) 6)  Infarction, Myocardial, Old  (ICD-412) 7)  Plantar Fasciitis  (ICD-728.71) 8)  Hematuria, Microscopic, Hx of (HUMPHRIES)  (ICD-V13.09) 9)  Cad  (ICD-414.00) 10)  Hypothyroidism  (ICD-244.9) 11)  Hypertension  (ICD-401.9) 12)  Hyperlipidemia  (ICD-272.4) 13)  Depression,recurrent  (ICD-311)  Medications Prior to Update: 1)  Synthroid 175 Mcg Tabs (Levothyroxine Sodium) .Marland Kitchen.. 1 Tablet By Mouth Once A Day 2)  Lisinopril 20 Mg Tabs (Lisinopril) .... Take 1 Tablet By Mouth Once A Day 3)  Flexeril 10 Mg  Tabs (Cyclobenzaprine Hcl) .... One Tab By Mouth Three Times A Day 4)  Soma 350 Mg  Tabs (Carisoprodol) .... One Tab By Mouth Qid As Needed Musc Spasm 5)  Zoloft 50 Mg  Tabs (Sertraline Hcl) .... 1/2 Tablet Daily 6)  Tussionex  Pennkinetic Er 8-10 Mg/6ml  Lqcr (Chlorpheniramine-Hydrocodone) .... One Tsp By Mouth At Night As Needed Cough. 7)  Zithromax Z-Pak 250 Mg  Tabs (Azithromycin) .... As Dir  Allergies: No Known Drug Allergies  Physical Exam  General:  Well-developed,well-nourished,in no acute distress; alert,appropriate and cooperative throughout examination. Mildly obese. Head:  Normocephalic and atraumatic without obvious abnormalities. No apparent alopecia or balding. Sinuses min tender. Eyes:  Conjunctiva clear bilaterally.  Ears:  External ear exam shows no significant lesions or deformities.  Otoscopic examination reveals clear canals, tympanic membranes are intact bilaterally without bulging, retraction, inflammation or discharge. Hearing is grossly normal bilaterally. Nose:  External nasal examination shows no deformity or inflammation. Nasal mucosa are pink and moist without lesions or exudates. Mouth:  Oral mucosa and oropharynx without lesions or exudates.  Teeth in good repair. White PND. Neck:  No deformities, masses, or tenderness noted. Chest Wall:  No deformities, masses, tenderness or gynecomastia noted. Lungs:  Normal respiratory effort, chest expands symmetrically. Lungs are clear to auscultation, no crackles or wheezes. Heart:  Normal rate and regular rhythm. S1 and S2 normal without gallop, murmur, click, rub or other extra sounds.   Impression & Recommendations:  Problem # 1:  DEPRESSION,RECURRENT (ICD-311) Assessment Unchanged  Stable, at times down. Will write script for  increase to one pill. His updated medication list for this problem includes:    Zoloft 50 Mg Tabs (Sertraline hcl) .Marland Kitchen... 1 tablet daily  Problem # 2:  HYPERTENSION (ICD-401.9) Assessment: Unchanged Great control, cont curr dose. His updated medication list for this problem includes:    Lisinopril 20 Mg Tabs (Lisinopril) .Marland Kitchen... Take 1 tablet by mouth once a day  BP today: 102/70 Prior BP: 110/70 (03/13/2008)   Labs Reviewed: K+: 4.4 (10/31/2007) Creat: : 1.0 (10/31/2007)   Chol: 180 (10/31/2007)   HDL: 44.3 (10/31/2007)   LDL: 115 (10/31/2007)   TG: 106 (10/31/2007)  Problem # 3:  HYPOTHYROIDISM (ICD-244.9) Assessment: Unchanged Feels well. Will recheck when scheduled for Comp Exam. His updated medication list for this problem includes:    Synthroid 175 Mcg Tabs (Levothyroxine sodium) .Marland Kitchen... 1 tablet by mouth once a day  Labs Reviewed: TSH: 2.92 (10/31/2007)    HgBA1c: 5.9 (12/28/2002) Chol: 180 (10/31/2007)   HDL: 44.3 (10/31/2007)   LDL: 115 (10/31/2007)   TG: 106 (10/31/2007)  Complete Medication List: 1)  Synthroid 175 Mcg Tabs (Levothyroxine sodium) .Marland Kitchen.. 1 tablet by mouth once a day 2)  Lisinopril 20 Mg Tabs (Lisinopril) .... Take 1 tablet by mouth once a day 3)  Zoloft 50 Mg Tabs (Sertraline hcl) .Marland Kitchen.. 1 tablet daily 4)  Aspirin 325 Mg Tabs (Aspirin) .... Take 1 tablet by mouth once a day  Patient Instructions: 1)  Look into [place], Mediterranean or Protein Power books for nutritious lifestyle. 2)  Please schedule for Comp Exam, lab prior., next avail. Prescriptions: LISINOPRIL 20 MG TABS (LISINOPRIL) Take 1 tablet by mouth once a day  #90 x 4   Entered and Authorized by:   Shaune Leeks MD   Signed by:   Shaune Leeks MD on 04/18/2009   Method used:   Print then Give to Patient   RxID:   [id number] ZOLOFT 50 MG  TABS (SERTRALINE HCL) 1 tablet daily  #90 x 4   Entered and Authorized by:   Shaune Leeks MD   Signed by:   Shaune Leeks MD on 04/18/2009   Method used:   Print then Give to Patient   RxID:   [id number] SYNTHROID 175 MCG TABS (LEVOTHYROXINE SODIUM) 1 tablet by mouth once a day  #90 x 4   Entered and Authorized by:   Shaune Leeks MD   Signed by:   Shaune Leeks MD on 04/18/2009   Method used:   Print then Give to Patient   RxID:   [id number]   Current Allergies (reviewed today): No known allergies

## 2010-11-27 NOTE — Assessment & Plan Note (Signed)
Summary: ?URI/CLE   Vital Signs:  Patient Profile:   60 Years Old Male Height:     65 inches (165.1 cm) Weight:      198 pounds Temp:     98.4 degrees F oral Pulse rate:   76 / minute Pulse rhythm:   regular BP sitting:   110 / 70  (left arm) Cuff size:   regular  Vitals Entered ByProvidence Crosby (Mar 13, 2008 11:45 AM)                 Chief Complaint:  uri symptoms.  History of Present Illness: Here for acute congestion, began 4 days ago. A liitle bit of everything...sore throat, coughing up a lot of junk..brown. mILD EAR PAIN THIS AM, rhinitis and the cough. No SOB, N/V. Mild chills, hasn't measured fever. Very Achey, sleeping at night until begins coughing. Has used plain Robitussin, 1 TBSP three times a day     Prior Medications Reviewed Using: Patient Recall  Current Allergies: No known allergies       Physical Exam  General:     Well-developed,well-nourished,in no acute distress; alert,appropriate and cooperative throughout examination, congested. Head:     Normocephalic and atraumatic without obvious abnormalities. No apparent alopecia or balding. Sinuses min tender. Eyes:     Conjunctiva clear bilaterally.  Ears:     Dull but nonerythem. Nose:     External nasal examination shows no deformity or inflammation. Nasal mucosa are pink and moist without lesions or exudates. Mouth:     Oral mucosa and oropharynx without lesions or exudates.  Teeth in good repair. White PND. Neck:     No deformities, masses, or tenderness noted. Chest Wall:     No deformities, masses, tenderness or gynecomastia noted. Lungs:     Normal respiratory effort, chest expands symmetrically. Lungs are clear to auscultation, no crackles or wheezes. Heart:     Normal rate and regular rhythm. S1 and S2 normal without gallop, murmur, click, rub or other extra sounds.    Impression & Recommendations:  Problem # 1:  URI (ICD-465.9) Assessment: New Presumed virus with  given  constellation of sxs. His updated medication list for this problem includes:    Tussionex Pennkinetic Er 8-10 Mg/6ml Lqcr (Chlorpheniramine-hydrocodone) ..... One tsp by mouth at night as needed cough. Instructed on symptomatic treatment. Call if symptoms persist or worsen.   Complete Medication List: 1)  Synthroid 175 Mcg Tabs (Levothyroxine sodium) .Marland Kitchen.. 1 tablet by mouth once a day 2)  Lisinopril 20 Mg Tabs (Lisinopril) .... Take 1 tablet by mouth once a day 3)  Flexeril 10 Mg Tabs (Cyclobenzaprine hcl) .... One tab by mouth three times a day 4)  Soma 350 Mg Tabs (Carisoprodol) .... One tab by mouth qid as needed musc spasm 5)  Zoloft 50 Mg Tabs (Sertraline hcl) .... 1/2 tablet daily 6)  Tussionex Pennkinetic Er 8-10 Mg/82ml Lqcr (Chlorpheniramine-hydrocodone) .... One tsp by mouth at night as needed cough. 7)  Zithromax Z-pak 250 Mg Tabs (Azithromycin) .... As dir   Patient Instructions: 1)  For congestion, take: 2)  GUAIFENESIN  600mg  by mouth AM and NOON   3)    ROBITUSSIN PLAIN (NO LETTERS, NO NAMES) two tablespoons  or 4)    [facility] MUCOUS RELIEF EXPECTORANT (400 mg) 11/2 TABS  or 5)    GUAIFENESIN (200 MG) 3 TABS  6)  Take Tyl ES 2 tabs four times a day. 7)  Drink Lots of  Fluids.                    8)  Take Zithromax if no better by weekend.   Prescriptions: ZITHROMAX Z-PAK 250 MG  TABS (AZITHROMYCIN) as dir  #1 pak x 0   Entered and Authorized by:   Shaune Leeks MD   Signed by:   Shaune Leeks MD on 03/13/2008   Method used:   Print then Give to Patient   RxID:   [id number] TUSSIONEX PENNKINETIC ER 8-10 MG/5ML  LQCR (CHLORPHENIRAMINE-HYDROCODONE) one tsp by mouth at night as needed cough.  #8 oz. x 0   Entered and Authorized by:   Shaune Leeks MD   Signed by:   Shaune Leeks MD on 03/13/2008   Method used:   Print then Give to Patient   RxID:   [id number]  ]

## 2011-03-13 NOTE — Assessment & Plan Note (Signed)
[facility]                                 ON-CALL NOTE   KREGG, CIHLAR                       MRN:          [id number]  DATE:12/04/2006                            DOB:          [birth year]    PRIMARY CARE PHYSICIAN:  Arta Silence, MD   Patient is calling because he has a runny nose, head congestion, and  nonproductive cough.  No fevers or chills.   Advised this sounds most like a viral syndrome.  Treat symptomatically  with over-the-counter medicines.  Call p.r.n.     Jeffrey A. Tawanna Cooler, MD  Electronically Signed    JAT/MedQ  DD: 12/04/2006  DT: 12/04/2006  Job #: [id number]

## 2011-03-13 NOTE — Procedures (Signed)
NAMEVICTORY, Shaun Gomez NO.:  [id number]   MEDICAL RECORD NO.:  [id number]          PATIENT TYPE:  OUT   LOCATION:  SLEEP CENTER                 FACILITY:  [facility]   PHYSICIAN:  Marcelyn Bruins, M.D. [facility] DATE OF BIRTH:  [birth year]   DATE OF STUDY:  01/03/2006                              NOCTURNAL POLYSOMNOGRAM   REFERRING PHYSICIAN:  Dr. Laurita Quint.   DATE OF STUDY:  January 03, 2006.   INDICATION FOR STUDY:  Hypersomnia with sleep apnea.   EPWORTH SCORE:  6.   SLEEP ARCHITECTURE:  The patient had a total sleep time of 283 minutes with  adequate REM but decreased slow wave sleep. Sleep onset latency was  prolonged at 44 minutes and REM onset was fairly rapid at 35 minutes. Sleep  efficiency was greatly decreased at 66%.   RESPIRATORY DATA:  The patient underwent split night study where he was  found to have 182 obstructive events in the first 125 minutes of sleep. This  gave him a respiratory disturbance index over this time of 88 events per  hour. The events were not positional but very loud snoring was noted. By  protocol, the patient was placed on a C-PAP mask with a small Respironics  comfort gel nasal mask. C-PAP was then initiated and ultimately he was  titrated to a final pressure of 16 cm for both obstructive events and  snoring. The patient did have some mild breakthrough during his last REM;  however, it was not overly clinically significant.   OXYGEN DATA:  There was O2 desaturation as low as 50% prior to C-PAP  initiation secondary to the patient's obstructive events.   CARDIAC DATA:  Occasional PAC.   MOVEMENT/PARASOMNIA:  The patient was found to have 118 leg jerks with no  significant sleep disruption.   IMPRESSION/RECOMMENDATIONS:  1.  Very severe obstructive sleep apnea with a respiratory disturbance index      of 88 events per hour and O2 desaturation as low as 50% during the first      half of the study. The patient was then placed on  C-PAP with a small      Respironics comfort gel nasal mask and ultimately titrated to 16 cm of      water pressure with fairly good control of his obstructive events.      Treatment should also include weight loss if applicable, as well as      reminding the patient to not drive if he is sleepy.  2.  Large numbers of leg jerks with very little sleep disruption. I suspect      this is secondary to the patient's sleep disordered breathing.           ______________________________  Marcelyn Bruins, M.D. [facility]  Diplomate, American Board of Sleep  Medicine     KC/MEDQ  D:  01/12/2006 14:18:03  T:  01/13/2006 13:54:30  Job:  [id number]

## 2011-07-29 ENCOUNTER — Other Ambulatory Visit: Payer: Self-pay | Admitting: Family Medicine

## 2011-08-10 ENCOUNTER — Other Ambulatory Visit: Payer: Self-pay | Admitting: Family Medicine

## 2011-11-19 ENCOUNTER — Other Ambulatory Visit: Payer: Self-pay

## 2011-11-19 DIAGNOSIS — I10 Essential (primary) hypertension: Secondary | ICD-10-CM

## 2011-11-19 DIAGNOSIS — Z125 Encounter for screening for malignant neoplasm of prostate: Secondary | ICD-10-CM

## 2011-11-19 DIAGNOSIS — E039 Hypothyroidism, unspecified: Secondary | ICD-10-CM

## 2011-11-19 MED ORDER — LEVOTHYROXINE SODIUM 175 MCG PO TABS: 175.0000 ug | ORAL_TABLET | Freq: Every day | ORAL | Status: DC

## 2011-11-19 MED ORDER — SERTRALINE HCL 50 MG PO TABS: 50.0000 mg | ORAL_TABLET | Freq: Every day | ORAL | Status: DC

## 2011-11-19 MED ORDER — LISINOPRIL 20 MG PO TABS: 20.0000 mg | ORAL_TABLET | Freq: Every day | ORAL | Status: DC

## 2011-11-19 NOTE — Telephone Encounter (Signed)
Pt has run out of meds. Pt said when refilled last he did not know he was to schedule appt. Pt request refills for Synthroid 175 mcg,Sertraline 50mg  and Lisinopril 20mg . [facility]. CPX was scheduled by Lyla Son for 03/25/12.Please advise.Pt request call back at [phone].

## 2011-11-19 NOTE — Telephone Encounter (Signed)
Sent.  Keep 5/13 OV.

## 2011-11-19 NOTE — Telephone Encounter (Signed)
Patient advised.

## 2012-03-22 ENCOUNTER — Encounter: Payer: Self-pay | Admitting: Family Medicine

## 2012-03-25 ENCOUNTER — Ambulatory Visit (INDEPENDENT_AMBULATORY_CARE_PROVIDER_SITE_OTHER): Payer: BC Managed Care – PPO | Admitting: Family Medicine

## 2012-03-25 ENCOUNTER — Encounter: Payer: Self-pay | Admitting: Family Medicine

## 2012-03-25 ENCOUNTER — Encounter: Payer: Self-pay | Admitting: *Deleted

## 2012-03-25 VITALS — BP 108/72 | HR 91 | Temp 98.4°F | Ht 65.5 in | Wt 200.0 lb

## 2012-03-25 DIAGNOSIS — K439 Ventral hernia without obstruction or gangrene: Secondary | ICD-10-CM

## 2012-03-25 DIAGNOSIS — I2510 Atherosclerotic heart disease of native coronary artery without angina pectoris: Secondary | ICD-10-CM

## 2012-03-25 DIAGNOSIS — I10 Essential (primary) hypertension: Secondary | ICD-10-CM

## 2012-03-25 DIAGNOSIS — Z1211 Encounter for screening for malignant neoplasm of colon: Secondary | ICD-10-CM

## 2012-03-25 DIAGNOSIS — F32A Depression, unspecified: Secondary | ICD-10-CM

## 2012-03-25 DIAGNOSIS — F329 Major depressive disorder, single episode, unspecified: Secondary | ICD-10-CM | POA: Insufficient documentation

## 2012-03-25 DIAGNOSIS — E039 Hypothyroidism, unspecified: Secondary | ICD-10-CM

## 2012-03-25 DIAGNOSIS — Z0000 Encounter for general adult medical examination without abnormal findings: Secondary | ICD-10-CM

## 2012-03-25 LAB — COMPREHENSIVE METABOLIC PANEL
ALT: 50 U/L (ref 0–53)
AST: 36 U/L (ref 0–37)
Albumin: 3.9 g/dL (ref 3.5–5.2)
Alkaline Phosphatase: 57 U/L (ref 39–117)
BUN: 22 mg/dL (ref 6–23)
CO2: 28 mEq/L (ref 19–32)
Calcium: 8.7 mg/dL (ref 8.4–10.5)
Chloride: 101 mEq/L (ref 96–112)
Creatinine, Ser: 0.9 mg/dL (ref 0.4–1.5)
GFR: 94.41 mL/min (ref >60.00)
Glucose, Bld: 110 mg/dL — ABNORMAL HIGH (ref 70–99)
Potassium: 4.3 mEq/L (ref 3.5–5.1)
Sodium: 137 mEq/L (ref 135–145)
Total Bilirubin: 0.5 mg/dL (ref 0.3–1.2)
Total Protein: 7.5 g/dL (ref 6.0–8.3)

## 2012-03-25 LAB — LIPID PANEL
Cholesterol: 147 mg/dL (ref 0–200)
HDL: 49.5 mg/dL (ref >39.00)
LDL Cholesterol: 80 mg/dL (ref 0–99)
Total CHOL/HDL Ratio: 3
Triglycerides: 88 mg/dL (ref 0.0–149.0)
VLDL: 17.6 mg/dL (ref 0.0–40.0)

## 2012-03-25 LAB — TSH: TSH: 0.07 u[IU]/mL — ABNORMAL LOW (ref 0.35–5.50)

## 2012-03-25 MED ORDER — LEVOTHYROXINE SODIUM 175 MCG PO TABS: 175.0000 ug | ORAL_TABLET | Freq: Every day | ORAL | Status: DC

## 2012-03-25 MED ORDER — LISINOPRIL 20 MG PO TABS: 20.0000 mg | ORAL_TABLET | Freq: Every day | ORAL | Status: DC

## 2012-03-25 MED ORDER — SERTRALINE HCL 50 MG PO TABS: 50.0000 mg | ORAL_TABLET | Freq: Every day | ORAL | Status: DC

## 2012-03-25 MED ORDER — LEVOTHYROXINE SODIUM 175 MCG PO TABS: ORAL_TABLET | ORAL | Status: DC

## 2012-03-25 NOTE — Assessment & Plan Note (Signed)
Controlled, continue current meds and check labs today.

## 2012-03-25 NOTE — Patient Instructions (Addendum)
Go to the lab on the way out.  We'll contact you with your lab report. See Shirlee Limerick about your referral before you leave today. Check with your insurance to see if they will cover the shingles shot. I would get a flu shot each fall. Gradually increase your amount of exercise.  Let me know if your mood worsens.

## 2012-03-25 NOTE — Assessment & Plan Note (Signed)
No pain, would have patient reest care with cards before considering sending to gen surgery.  D/w pt, he agrees.

## 2012-03-25 NOTE — Assessment & Plan Note (Signed)
Routine anticipatory guidance given to patient.  See health maintenance. Tetanus 2008 Flu shot encouraged.  D/w patient ZO:XWRUEAV for colon cancer screening, including IFOB vs. colonoscopy.  Risks and benefits of both were discussed and patient voiced understanding.  Pt elects WUJ:WJXB.  PSA options were discussed along with recent recs.  Low risk and there is no FH of prosate CA.  He declined testing of PSA. Has a living will.

## 2012-03-25 NOTE — Assessment & Plan Note (Signed)
With family upheaval but no SI.  Okay for outpatient f/u.  D/w pt about lifestyle changes and he understood.  Continue current meds.

## 2012-03-25 NOTE — Progress Notes (Signed)
Addended by: Lars Mage on: 03/25/2012 02:56 PM   Modules accepted: Orders

## 2012-03-25 NOTE — Assessment & Plan Note (Signed)
No CP, refer back to cards to reest care.  See notes on labs.  Continue current meds for now.

## 2012-03-25 NOTE — Assessment & Plan Note (Signed)
Continue current meds for now, see notes on labs.

## 2012-03-25 NOTE — Progress Notes (Signed)
CPE- See plan.  Routine anticipatory guidance given to patient.  See health maintenance. Tetanus 2008 Flu shot encouraged.  D/w patient ZO:XWRUEAV for colon cancer screening, including IFOB vs. colonoscopy.  Risks and benefits of both were discussed and patient voiced understanding.  Pt elects WUJ:WJXB.  PSA options were discussed along with recent recs.  Low risk and there is no FH of prosate CA.  He declined testing of PSA. Has a living will.    CAD.  No CP. He has some SOB likely related to deconditioning.  No BLE edema.  Due for cards f/u and labs.   Hypertension:    Using medication without problems or lightheadedness: yes Chest pain with exertion:no Edema:no Short of breath: occ, but this could be due to deconditioning.   Hypothyroid.  Due for labs.  Compliant with meds.  No neck mass, dysphagia.    OSA.  On CPAP.  Doing well with that, sleep is much improved.  Wakes with more energy than if he hadn't used CPAP.   Depressed Mood:  Sleep decreased/ Insomnia/Early awakening: yes Interest decreased in activities: yes Guilt or worthlessness: yes Energy decreased: yes Concentration difficulties: yes Appetite disturbance or weight loss: no Psychomotor retardation/agitation: no Suicidal thoughts: no He's had a lot of family members sick, bother had died recently.  He didn't f/u with counseling after last OV.  He's still on SSRI and it had helped some prev, wife had noted an improvement on the medicine. We talked about implementing lifestyle changes to help his mood.    PMH and SH reviewed  Meds, vitals, and allergies reviewed.   ROS: See HPI.  Otherwise negative.    GEN: nad, alert and oriented HEENT: mucous membranes moist NECK: supple w/o LA CV: rrr. PULM: ctab, no inc wob ABD: soft, +bs, soft ventral hernia noted EXT: no edema SKIN: no acute rash DRE deferred.

## 2012-05-04 ENCOUNTER — Ambulatory Visit: Payer: BC Managed Care – PPO | Admitting: Cardiology

## 2012-05-20 ENCOUNTER — Other Ambulatory Visit: Payer: BC Managed Care – PPO

## 2012-06-14 ENCOUNTER — Ambulatory Visit (INDEPENDENT_AMBULATORY_CARE_PROVIDER_SITE_OTHER): Payer: BC Managed Care – PPO | Admitting: Cardiology

## 2012-06-14 ENCOUNTER — Encounter: Payer: Self-pay | Admitting: Cardiology

## 2012-06-14 VITALS — BP 128/82 | HR 92 | Ht 66.0 in | Wt 204.0 lb

## 2012-06-14 DIAGNOSIS — I252 Old myocardial infarction: Secondary | ICD-10-CM

## 2012-06-14 DIAGNOSIS — I259 Chronic ischemic heart disease, unspecified: Secondary | ICD-10-CM

## 2012-06-14 DIAGNOSIS — I2510 Atherosclerotic heart disease of native coronary artery without angina pectoris: Secondary | ICD-10-CM

## 2012-06-14 DIAGNOSIS — E785 Hyperlipidemia, unspecified: Secondary | ICD-10-CM

## 2012-06-14 DIAGNOSIS — I10 Essential (primary) hypertension: Secondary | ICD-10-CM

## 2012-06-14 NOTE — Patient Instructions (Addendum)
The current medical regimen is effective;  continue present plan and medications.  Your physician has requested that you have an exercise tolerance test. For further information please visit [email or web address]. Please also follow instruction sheet, as given.  Your physician has requested that you have an echocardiogram. Echocardiography is a painless test that uses sound waves to create images of your heart. It provides your doctor with information about the size and shape of your heart and how well your heart's chambers and valves are working. This procedure takes approximately one hour. There are no restrictions for this procedure.   

## 2012-06-14 NOTE — Progress Notes (Signed)
HPI The patient presents as a new patient. He has a history of coronary disease with bypass in 1997. Notes from his primary provider indicate an EF of 30% at that time though I don't have these records. He hasn't seen a cardiologist in some years. He's had no stress testing since 1997. He reports that he does well. His MI was related temporally to self injection of Caverject for erectile dysfunction. He had his bypass in [place]. Since that time he's had no further chest discomfort, neck or arm discomfort. He has no shortness of breath, PND or orthopnea. He has no palpitations, presyncope or syncope. He is active although he does not exercise routinely.  Allergies  Allergen Reactions  . Caverject (Alprostadil)     MI after injection    Current Outpatient Prescriptions  Medication Sig Dispense Refill  . Ascorbic Acid (VITAMIN C) 1000 MG tablet Take 1,000 mg by mouth daily.      Marland Kitchen aspirin 325 MG tablet Take 325 mg by mouth daily.      . cholecalciferol (VITAMIN D) 1000 UNITS tablet Take 1,000 Units by mouth daily.      Marland Kitchen levothyroxine (SYNTHROID) 175 MCG tablet Take 1 pill a day except for 0.5 pill on Sunday      . lisinopril (PRINIVIL,ZESTRIL) 20 MG tablet Take 1 tablet (20 mg total) by mouth daily.  90 tablet  3  . Multiple Vitamin (MULTIVITAMIN) tablet Take 1 tablet by mouth daily.      . sertraline (ZOLOFT) 50 MG tablet Take 1 tablet (50 mg total) by mouth daily.  90 tablet  3  . vitamin B-12 (CYANOCOBALAMIN) 1000 MCG tablet Take 1,000 mcg by mouth daily.        Past Medical History  Diagnosis Date  . Depression 07/1993  . Hyperlipidemia 02/1996  . Hypertension pre 08/25/1996  . Thyroid disease 07/1993    Hypothyroidism  . OSA on CPAP     Past Surgical History  Procedure Date  . Appendectomy 2 YOA  . Coronary artery bypass graft 02/1996    X 2 EF 30%  . Stress cardiolite 03/1996    EF 30%  . Sleep study 01/03/2006    Severe sleep apnea - 88 events per hour )2 sats down  to 50% ?  Marland Kitchen Mi s/p caverject injection 02/1996    Family History  Problem Relation Age of Onset  . Hypertension Mother   . Parkinsonism Mother   . Dementia Mother   . Stroke Father 80  . Depression Sister   . Anxiety disorder Sister   . Heart disease Brother     CAD  . Hyperlipidemia Brother   . Hypertension Brother   . Arthritis Neg Hx   . Cancer Neg Hx   . Alcohol abuse Neg Hx   . Drug abuse Neg Hx   . Colon cancer Neg Hx   . Prostate cancer Neg Hx   . Heart disease Brother     CAD    History   Social History  . Marital Status: Married    Spouse Name: N/A    Number of Children: N/A  . Years of Education: N/A   Occupational History  . 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 Smoking status: Former Games developer  . Smokeless tobacco: Not on file  . Alcohol Use: Yes  . Drug Use: Not on file  . Sexually  Active: Not on file   Other Topics Concern  . 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 Otherwise as stated in the HPI and negative for all other systems.  PHYSICAL EXAM BP 128/82  Pulse 92  Ht 5\' 6"  (1.676 m)  Wt 204 lb (92.534 kg)  BMI 32.93 kg/m2 GENERAL:  Well appearing HEENT:  Pupils equal round and reactive, fundi not visualized, oral mucosa unremarkable NECK:  No jugular venous distention, waveform within normal limits, carotid upstroke brisk and symmetric, no bruits, no thyromegaly LYMPHATICS:  No cervical, inguinal adenopathy LUNGS:  Clear to auscultation bilaterally BACK:  No CVA tenderness CHEST:  Well healed sternotomy scar. HEART:  PMI not displaced or sustained,S1 and S2 within normal limits, no S3, no S4, no clicks, no rubs, no murmurs ABD:  Flat, positive bowel sounds normal in frequency in pitch, no bruits,  no rebound, no guarding, no midline pulsatile mass, no hepatomegaly, no splenomegaly, obese EXT:  2 plus pulses throughout, no edema, no cyanosis no clubbing SKIN:  No rashes no nodules NEURO:  Cranial nerves II through XII grossly intact, motor grossly intact throughout [facility]:  Cognitively intact, oriented to person place and time  EKG: Sinus rhythm, rate 81, left axis deviation, old anteroseptal infarct, and no acute ST-T wave changes 03/25/12    ASSESSMENT AND PLAN  CAD - I will bring the patient back for a POET (Plain Old Exercise Test). This will allow me to screen for obstructive coronary disease, risk stratify and very importantly provide a prescription for exercise.   Cardiomyopathy -  I will follow up with an echocardiogram and med adjustment as indicated and necessary from this.   Obesity-  The patient understands the need to lose weight with diet and exercise. We have discussed specific strategies for this.   HTN  - The blood pressure is at target. No change in medications is indicated. We will continue with therapeutic lifestyle changes (TLC).   Hyperlipidemia -  I reviewed his lipid profile. He is LDL and HDL are reasonable targets and he wants to avoid statins. No change in therapy is indicated.

## 2012-06-16 ENCOUNTER — Other Ambulatory Visit (HOSPITAL_COMMUNITY): Payer: Self-pay | Admitting: Cardiology

## 2012-06-16 ENCOUNTER — Ambulatory Visit (HOSPITAL_COMMUNITY): Payer: BC Managed Care – PPO | Attending: Cardiology | Admitting: Radiology

## 2012-06-16 ENCOUNTER — Other Ambulatory Visit: Payer: Self-pay

## 2012-06-16 DIAGNOSIS — I259 Chronic ischemic heart disease, unspecified: Secondary | ICD-10-CM

## 2012-06-16 DIAGNOSIS — I2510 Atherosclerotic heart disease of native coronary artery without angina pectoris: Secondary | ICD-10-CM

## 2012-06-16 DIAGNOSIS — Z87891 Personal history of nicotine dependence: Secondary | ICD-10-CM | POA: Insufficient documentation

## 2012-06-16 DIAGNOSIS — I079 Rheumatic tricuspid valve disease, unspecified: Secondary | ICD-10-CM | POA: Insufficient documentation

## 2012-06-16 DIAGNOSIS — I10 Essential (primary) hypertension: Secondary | ICD-10-CM | POA: Insufficient documentation

## 2012-06-16 MED ORDER — PERFLUTREN LIPID MICROSPHERE 6.52 MG/ML IV SUSP
10.0000 uL/kg | Freq: Once | INTRAVENOUS | Status: AC
  Administered 2012-06-16: 1.023 mg via INTRAVENOUS

## 2012-06-16 NOTE — Progress Notes (Signed)
Echocardiogram performed.  

## 2012-06-17 ENCOUNTER — Encounter (HOSPITAL_COMMUNITY): Payer: Self-pay | Admitting: Cardiology

## 2012-06-17 ENCOUNTER — Encounter: Payer: Self-pay | Admitting: Cardiology

## 2012-07-07 ENCOUNTER — Ambulatory Visit (INDEPENDENT_AMBULATORY_CARE_PROVIDER_SITE_OTHER): Payer: BC Managed Care – PPO | Admitting: Physician Assistant

## 2012-07-07 ENCOUNTER — Encounter: Payer: Self-pay | Admitting: Physician Assistant

## 2012-07-07 DIAGNOSIS — I259 Chronic ischemic heart disease, unspecified: Secondary | ICD-10-CM

## 2012-07-07 NOTE — Procedures (Signed)
Exercise Treadmill Test  Pre-Exercise Testing Evaluation Rhythm: normal sinus  Rate: 74   PR:  .16 QRS:  .10  QT:  .36 QTc: .40     Test  Exercise Tolerance Test Ordering MD: Angelina Sheriff, MD  Interpreting MD: Tereso Newcomer , PA-C  Unique Test No: 1  Treadmill:  1  Indication for ETT: known ASHD  Contraindication to ETT: No   Stress Modality: exercise - treadmill  Cardiac Imaging Performed: non   Protocol: standard Bruce - maximal  Max BP:  169/80  Max MPHR (bpm):  158 85% MPR (bpm):  134  MPHR obtained (bpm):  144 % MPHR obtained:  90%  Reached 85% MPHR (min:sec):  8:00 Total Exercise Time (min-sec):  9:07  Workload in METS:  10.2 Borg Scale: 15  Reason ETT Terminated:  patient's desire to stop    ST Segment Analysis At Rest: normal ST segments - no evidence of significant ST depression With Exercise: non-specific ST changes  Other Information Arrhythmia:  No Angina during ETT:  absent (0) Quality of ETT:  diagnostic  ETT Interpretation:  normal - no evidence of ischemia by ST analysis  Comments: Good exercise tolerance. No chest pain. Normal BP response to exercise. No ST-T changes to suggest ischemia.   Recommendations: Follow up with Dr. Rollene Rotunda as directed. Tereso Newcomer, PA-C  9:43 AM 07/07/2012

## 2013-03-19 ENCOUNTER — Other Ambulatory Visit: Payer: Self-pay | Admitting: Family Medicine

## 2013-03-21 NOTE — Telephone Encounter (Signed)
No recent appt and no future appts, left voicemail on home # requesting pt to call office so we can get a f/u: med refill appt schedule before I refill medication

## 2013-03-23 ENCOUNTER — Telehealth: Payer: Self-pay | Admitting: *Deleted

## 2013-03-23 DIAGNOSIS — G4733 Obstructive sleep apnea (adult) (pediatric): Secondary | ICD-10-CM

## 2013-03-23 NOTE — Telephone Encounter (Signed)
Wrote out script and placed in Kim's box.

## 2013-03-23 NOTE — Telephone Encounter (Signed)
Noted, thanks!

## 2013-03-23 NOTE — Telephone Encounter (Signed)
F/u appt scheduled for 04/21/13 and meds refilled until then

## 2013-03-23 NOTE — Telephone Encounter (Signed)
Pt's C-pap machine is broken and he needs a new order for a new machine faxed to , I advise pt that Dr. Para March is out of office until Monday and he said that he doesn't think he can wait that long and wants to see if another doctor could write order today, pt did also need med refilled so I refilled med and scheduled a f/u appt with Dr. Para March in June since pt hasn't been seen in over a year, please advise  Pt needs order faxed to [facility] at fax # [phone]

## 2013-03-23 NOTE — Telephone Encounter (Signed)
Left 2nd voicemail requesting pt to call office  

## 2013-03-23 NOTE — Telephone Encounter (Signed)
Orders faxed and pt notified

## 2013-03-24 NOTE — Telephone Encounter (Signed)
No baseline sleep study available - setting is 16cm H2O

## 2013-04-12 ENCOUNTER — Other Ambulatory Visit: Payer: Self-pay | Admitting: Family Medicine

## 2013-04-12 NOTE — Telephone Encounter (Signed)
Sent!

## 2013-04-12 NOTE — Telephone Encounter (Signed)
Last seen 02/2012, scheduled appt for 04/21/2013, please advise on refill

## 2013-04-21 ENCOUNTER — Ambulatory Visit (INDEPENDENT_AMBULATORY_CARE_PROVIDER_SITE_OTHER): Payer: BC Managed Care – PPO | Admitting: Family Medicine

## 2013-04-21 ENCOUNTER — Encounter: Payer: Self-pay | Admitting: Family Medicine

## 2013-04-21 VITALS — BP 134/84 | HR 72 | Temp 98.1°F | Wt 203.0 lb

## 2013-04-21 DIAGNOSIS — M25571 Pain in right ankle and joints of right foot: Secondary | ICD-10-CM

## 2013-04-21 DIAGNOSIS — I10 Essential (primary) hypertension: Secondary | ICD-10-CM

## 2013-04-21 DIAGNOSIS — F32A Depression, unspecified: Secondary | ICD-10-CM

## 2013-04-21 DIAGNOSIS — I2510 Atherosclerotic heart disease of native coronary artery without angina pectoris: Secondary | ICD-10-CM

## 2013-04-21 DIAGNOSIS — M25579 Pain in unspecified ankle and joints of unspecified foot: Secondary | ICD-10-CM

## 2013-04-21 DIAGNOSIS — E039 Hypothyroidism, unspecified: Secondary | ICD-10-CM

## 2013-04-21 DIAGNOSIS — F329 Major depressive disorder, single episode, unspecified: Secondary | ICD-10-CM

## 2013-04-21 DIAGNOSIS — G4733 Obstructive sleep apnea (adult) (pediatric): Secondary | ICD-10-CM

## 2013-04-21 DIAGNOSIS — Z9989 Dependence on other enabling machines and devices: Secondary | ICD-10-CM

## 2013-04-21 MED ORDER — LISINOPRIL 20 MG PO TABS: 20.0000 mg | ORAL_TABLET | Freq: Every day | ORAL | Status: DC

## 2013-04-21 MED ORDER — LEVOTHYROXINE SODIUM 175 MCG PO TABS: ORAL_TABLET | ORAL | Status: DC

## 2013-04-21 MED ORDER — SERTRALINE HCL 50 MG PO TABS: 50.0000 mg | ORAL_TABLET | Freq: Every day | ORAL | Status: DC

## 2013-04-21 NOTE — Progress Notes (Signed)
OSA and his CPAP.  He needs a new machine, his old one is broken.  He can't sleep well w/o it.  He'll toss and turn all night and wake up with a ST.  It will be cheaper for him to buy the machine on line if he has a CPAP rx.  He needs a new rx.  He was on 16mm H2O.  If he'll let me know what the rx needs to state, I told him I would work on it.    Hypertension:    Using medication without problems or lightheadedness: yes Chest pain with exertion:no Edema:no Short of breath:no Due for labs.   Prev echo reviewed.    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  His responsibilities increased but his pay didn't.  He continues on baseline meds.  No si/hi.  He gets fatigued, and "it's a struggle but I have to deal with it, one day at a time."  We talked about SSRI dose changes and he didn't want to change meds or get on a higher dose.    Recently with cough, sneezing.  Started when he came off the CPAP.    He has pain on the R 1st MTP, had seen podiatry with some pain radiating proximally.   Hypothyroid, due for labs. No dysphagia.    Meds, vitals, and allergies reviewed.   ROS: See HPI.  Otherwise, noncontributory.  GEN: nad, alert and oriented HEENT: mucous membranes moist NECK: supple w/o LA, no tmg CV: rrr.  no murmur PULM: ctab, no inc wob ABD: soft, +bs EXT: no edema SKIN: no acute rash R foot w/o erythema, not ttp (some pain with walking per patient)

## 2013-04-21 NOTE — Patient Instructions (Addendum)
Check your settings and let me know about what you need on the prescription for CPAP.   Schedule a fasting lab visit.   Try mucinex for the congestion, you can try claritin for the runny nose.  Both are OTC.

## 2013-04-23 DIAGNOSIS — M25579 Pain in unspecified ankle and joints of unspecified foot: Secondary | ICD-10-CM | POA: Insufficient documentation

## 2013-04-23 NOTE — Assessment & Plan Note (Signed)
Benign exam, he can f/u with podiatry prn.

## 2013-04-23 NOTE — Assessment & Plan Note (Signed)
Continue current meds, needs to continue to work on diet and weight.

## 2013-04-23 NOTE — Assessment & Plan Note (Signed)
Return for f/u labs.   

## 2013-04-23 NOTE — Assessment & Plan Note (Signed)
He'll check his settings and let me know what he needs.

## 2013-04-23 NOTE — Assessment & Plan Note (Signed)
No si/hi, he's trying to process his work situation, no change in meds.  He'll notify us if worsening.

## 2013-08-24 ENCOUNTER — Encounter: Payer: Self-pay | Admitting: Family Medicine

## 2013-08-28 ENCOUNTER — Other Ambulatory Visit: Payer: Self-pay | Admitting: Family Medicine

## 2013-08-28 ENCOUNTER — Encounter: Payer: Self-pay | Admitting: Family Medicine

## 2013-08-28 MED ORDER — SERTRALINE HCL 50 MG PO TABS: 50.0000 mg | ORAL_TABLET | Freq: Every day | ORAL | Status: DC

## 2013-08-31 ENCOUNTER — Other Ambulatory Visit: Payer: Self-pay

## 2013-09-26 ENCOUNTER — Other Ambulatory Visit: Payer: Self-pay | Admitting: Family Medicine

## 2013-12-04 ENCOUNTER — Other Ambulatory Visit (INDEPENDENT_AMBULATORY_CARE_PROVIDER_SITE_OTHER): Payer: BC Managed Care – PPO

## 2013-12-04 DIAGNOSIS — I2510 Atherosclerotic heart disease of native coronary artery without angina pectoris: Secondary | ICD-10-CM

## 2013-12-04 LAB — BASIC METABOLIC PANEL
BUN: 14 mg/dL (ref 6–23)
CO2: 23 mEq/L (ref 19–32)
Calcium: 9.2 mg/dL (ref 8.4–10.5)
Chloride: 104 mEq/L (ref 96–112)
Creatinine, Ser: 0.9 mg/dL (ref 0.4–1.5)
GFR: 96.45 mL/min (ref >60.00)
Glucose, Bld: 139 mg/dL — ABNORMAL HIGH (ref 70–99)
Potassium: 4.4 mEq/L (ref 3.5–5.1)
Sodium: 136 mEq/L (ref 135–145)

## 2013-12-04 LAB — LIPID PANEL
Cholesterol: 148 mg/dL (ref 0–200)
HDL: 45.8 mg/dL (ref >39.00)
LDL Cholesterol: 74 mg/dL (ref 0–99)
Total CHOL/HDL Ratio: 3
Triglycerides: 143 mg/dL (ref 0.0–149.0)
VLDL: 28.6 mg/dL (ref 0.0–40.0)

## 2013-12-04 LAB — HEMOGLOBIN: Hemoglobin: 15 g/dL (ref 13.0–17.0)

## 2013-12-04 LAB — TSH: TSH: 0.18 u[IU]/mL — ABNORMAL LOW (ref 0.35–5.50)

## 2013-12-05 ENCOUNTER — Ambulatory Visit: Payer: BC Managed Care – PPO

## 2013-12-05 DIAGNOSIS — R7309 Other abnormal glucose: Secondary | ICD-10-CM

## 2013-12-05 LAB — HEMOGLOBIN A1C: Hgb A1c MFr Bld: 6.6 % — ABNORMAL HIGH (ref 4.6–6.5)

## 2013-12-07 ENCOUNTER — Encounter: Payer: Self-pay | Admitting: Family Medicine

## 2013-12-07 ENCOUNTER — Ambulatory Visit (INDEPENDENT_AMBULATORY_CARE_PROVIDER_SITE_OTHER): Payer: BC Managed Care – PPO | Admitting: Family Medicine

## 2013-12-07 VITALS — BP 116/70 | HR 92 | Temp 98.6°F | Ht 66.0 in | Wt 202.5 lb

## 2013-12-07 DIAGNOSIS — E119 Type 2 diabetes mellitus without complications: Secondary | ICD-10-CM

## 2013-12-07 DIAGNOSIS — E039 Hypothyroidism, unspecified: Secondary | ICD-10-CM

## 2013-12-07 DIAGNOSIS — K439 Ventral hernia without obstruction or gangrene: Secondary | ICD-10-CM

## 2013-12-07 MED ORDER — SERTRALINE HCL 50 MG PO TABS: 50.0000 mg | ORAL_TABLET | Freq: Every day | ORAL | Status: DC

## 2013-12-07 MED ORDER — LEVOTHYROXINE SODIUM 175 MCG PO TABS: ORAL_TABLET | ORAL | Status: DC

## 2013-12-07 MED ORDER — LISINOPRIL 20 MG PO TABS: 20.0000 mg | ORAL_TABLET | Freq: Every day | ORAL | Status: DC

## 2013-12-07 NOTE — Patient Instructions (Signed)
Shirlee LimerickMarion will call about your referral. Check with your insurance to see if they will cover the diabetes class in the meantime.  Look at the "eat right diet." Read up on diabetes.org.  Look at the TYPE 2 section.  Start working on walking for exercise.   Recheck labs in about 3 months.

## 2013-12-07 NOTE — Progress Notes (Signed)
Pre-visit discussion using our clinic review tool. No additional management support is needed unless otherwise documented below in the visit note.  Hernia of abd wall- his surgery eval had to be cancelled prev.  Asking about putting eval off for now.  Not bigger, not bothersome.  This is reasonable to delay for now.  Routine cautions given.   DM2. Prev MD had d/w pt about sugar.  Now with DM2 dx.  CPE tabled in light of this.    Hypothyroid.  Labs d/w pt. No ADE.  Complaint.  Over-replaced based on TSH.   PMH and SH reviewed  ROS: See HPI, otherwise noncontributory.  Meds, vitals, and allergies reviewed.   GEN: nad, alert and oriented HEENT: mucous membranes moist NECK: supple w/o LA CV: rrr.  PULM: ctab, no inc wob ABD: soft abd hernia noted

## 2013-12-08 ENCOUNTER — Telehealth: Payer: Self-pay

## 2013-12-08 ENCOUNTER — Encounter: Payer: Self-pay | Admitting: Family Medicine

## 2013-12-08 DIAGNOSIS — E119 Type 2 diabetes mellitus without complications: Secondary | ICD-10-CM | POA: Insufficient documentation

## 2013-12-08 NOTE — Assessment & Plan Note (Signed)
Reasonable to delay surgery eval for now.  Routine cautions given.

## 2013-12-08 NOTE — Assessment & Plan Note (Addendum)
Dec to 1 pill a day except for 0.5 pill on Sunday and Wednesdays, recheck TSH in about 3 months.

## 2013-12-08 NOTE — Assessment & Plan Note (Signed)
>  45 minutes spent in face to face time with patient, >50% spent in counselling or coordination of care (9:00 to 9:50 AM).  Path/phys, diet, exercise d/w pt.   Refer for teaching.  Diet currently with sausage biscuits, burgers and fries, variable dinner.  Case of beer a week = ~3600 extra cal per week.  Discussed carb taper, esp slow taper of beer.   He's upset about dx.  He needs to work on lifestyle.   Recheck A1c in about 3 months.

## 2013-12-08 NOTE — Telephone Encounter (Signed)
Relevant patient education assigned to patient using Emmi. ° °

## 2014-02-13 ENCOUNTER — Encounter: Payer: Self-pay | Admitting: Family Medicine

## 2014-02-13 ENCOUNTER — Ambulatory Visit (INDEPENDENT_AMBULATORY_CARE_PROVIDER_SITE_OTHER): Payer: BC Managed Care – PPO | Admitting: Family Medicine

## 2014-02-13 VITALS — BP 118/80 | HR 87 | Temp 98.3°F | Wt 199.8 lb

## 2014-02-13 DIAGNOSIS — R109 Unspecified abdominal pain: Secondary | ICD-10-CM

## 2014-02-13 DIAGNOSIS — R1032 Left lower quadrant pain: Secondary | ICD-10-CM

## 2014-02-13 MED ORDER — METRONIDAZOLE 500 MG PO TABS: 500.0000 mg | ORAL_TABLET | Freq: Three times a day (TID) | ORAL | Status: DC

## 2014-02-13 MED ORDER — CIPROFLOXACIN HCL 500 MG PO TABS: 500.0000 mg | ORAL_TABLET | Freq: Two times a day (BID) | ORAL | Status: DC

## 2014-02-13 NOTE — Progress Notes (Signed)
Pre visit review using our clinic review tool, if applicable. No additional management support is needed unless otherwise documented below in the visit note.  Was putting up cabinets Saturday.  Had some L side pain.  Worse Sunday.  Much worse Monday AM.  No R sided sx.  He was gassy, dec in appetite, bloated lower abd.    Sore to the touch on the L side of abd.  He didn't now if he had pulled a muscle, but this didn't feel like that.  Had a BM today, some better after that.  No blood in stool.  No vomiting. No fevers known but had some sweats last night.  Pain was "deep" in the abd. A little better today.  No h/o diverticulitis. No CP, not SOB.  Feels weak but that is likely from not eating much.  No nausea.  Normal UOP.    Meds, vitals, and allergies reviewed.   ROS: See HPI.  Otherwise, noncontributory.  nad ncat Mmm rrr ctab abd soft, not ttp except the LLQ, no rebound, rectus not ttp on testing with sitting up.normal BS No cva pain No rash

## 2014-02-13 NOTE — Assessment & Plan Note (Signed)
Very likely diverticulitis, not severe.  D/w pt.  With some improvement.  Nontoxic.  D/w pt about possible CT.  Reasonable to proceed with treatment given the typical presentation. If worsening, to ER. Update me tomorrow.  He agrees.  Okay for outpatient f/u.  Path/phys and anatomy d/w pt.  >25 minutes spent in face to face time with patient, >50% spent in counselling or coordination of care.

## 2014-02-13 NOTE — Patient Instructions (Signed)
It looks like you have diverticulitis, but not a severe flare.   Go to the lab on the way out.  We'll contact you with your lab report. Start the antibiotics today.  Don't drink alcohol while on the antibiotics.  Update me tomorrow. If you have a sudden increase in pain, then go to the ER.

## 2014-02-14 LAB — COMPREHENSIVE METABOLIC PANEL
ALT: 64 U/L — ABNORMAL HIGH (ref 0–53)
AST: 37 U/L (ref 0–37)
Albumin: 3.7 g/dL (ref 3.5–5.2)
Alkaline Phosphatase: 52 U/L (ref 39–117)
BUN: 14 mg/dL (ref 6–23)
CO2: 27 mEq/L (ref 19–32)
Calcium: 9.1 mg/dL (ref 8.4–10.5)
Chloride: 100 mEq/L (ref 96–112)
Creatinine, Ser: 0.9 mg/dL (ref 0.4–1.5)
GFR: 87.98 mL/min (ref >60.00)
Glucose, Bld: 92 mg/dL (ref 70–99)
Potassium: 4.2 mEq/L (ref 3.5–5.1)
Sodium: 134 mEq/L — ABNORMAL LOW (ref 135–145)
Total Bilirubin: 1.3 mg/dL — ABNORMAL HIGH (ref 0.3–1.2)
Total Protein: 7.7 g/dL (ref 6.0–8.3)

## 2014-02-14 LAB — CBC WITH DIFFERENTIAL/PLATELET
Basophils Absolute: 0.1 10*3/uL (ref 0.0–0.1)
Basophils Relative: 0.7 % (ref 0.0–3.0)
Eosinophils Absolute: 0.1 10*3/uL (ref 0.0–0.7)
Eosinophils Relative: 0.8 % (ref 0.0–5.0)
HCT: 44.5 % (ref 39.0–52.0)
Hemoglobin: 15.2 g/dL (ref 13.0–17.0)
Lymphocytes Relative: 25.9 % (ref 12.0–46.0)
Lymphs Abs: 2.5 10*3/uL (ref 0.7–4.0)
MCHC: 34.2 g/dL (ref 30.0–36.0)
MCV: 101.3 fl — ABNORMAL HIGH (ref 78.0–100.0)
Monocytes Absolute: 1.6 10*3/uL — ABNORMAL HIGH (ref 0.1–1.0)
Monocytes Relative: 17.1 % — ABNORMAL HIGH (ref 3.0–12.0)
Neutro Abs: 5.3 10*3/uL (ref 1.4–7.7)
Neutrophils Relative %: 55.5 % (ref 43.0–77.0)
Platelets: 248 10*3/uL (ref 150.0–400.0)
RBC: 4.39 Mil/uL (ref 4.22–5.81)
RDW: 13 % (ref 11.5–14.6)
WBC: 9.6 10*3/uL (ref 4.5–10.5)

## 2014-02-14 LAB — LIPASE: Lipase: 31 U/L (ref 11.0–59.0)

## 2014-04-19 ENCOUNTER — Other Ambulatory Visit: Payer: Self-pay | Admitting: Family Medicine

## 2014-05-24 ENCOUNTER — Other Ambulatory Visit: Payer: Self-pay | Admitting: Family Medicine

## 2014-11-27 ENCOUNTER — Encounter: Payer: Self-pay | Admitting: Family Medicine

## 2014-11-30 ENCOUNTER — Ambulatory Visit: Payer: BC Managed Care – PPO | Admitting: Family Medicine

## 2014-12-25 ENCOUNTER — Encounter: Payer: Self-pay | Admitting: Family Medicine

## 2014-12-28 ENCOUNTER — Ambulatory Visit (INDEPENDENT_AMBULATORY_CARE_PROVIDER_SITE_OTHER): Payer: BLUE CROSS/BLUE SHIELD | Admitting: Family Medicine

## 2014-12-28 ENCOUNTER — Encounter: Payer: Self-pay | Admitting: Family Medicine

## 2014-12-28 ENCOUNTER — Telehealth: Payer: Self-pay | Admitting: Family Medicine

## 2014-12-28 VITALS — BP 122/74 | HR 93 | Temp 99.0°F | Wt 212.0 lb

## 2014-12-28 DIAGNOSIS — E119 Type 2 diabetes mellitus without complications: Secondary | ICD-10-CM

## 2014-12-28 NOTE — Progress Notes (Signed)
Pre visit review using our clinic review tool, if applicable. No additional management support is needed unless otherwise documented below in the visit note.  He came in today for follow up to discuss his goals.  His goals for healthcare: weight loss, has plans for an exercise routine.  Still drinking case of beer a week.  D/w pt about empty calories.    I asked him why he didn't follow up last year.   He was given specific instructions, but didn't follow up.  He said there was a problem with the front office and he didn't have clear instructions.   The point remains that I had given him clear instructions.   He asked, "Is this about you controlling things?"  I told him that it wasn't.  I told him I am trying to help him.  The reason for follow up today was about him "not controlling things."   I would like to help patients.  The issue is not where he gets care, but that he gets care.  He told me that was impersonal.   I advised him to find another doctor if he didn't approve of my care.  He will do so and I'll defer to him.  No charge for visit.  Discharge from practice immediately.

## 2014-12-28 NOTE — Patient Instructions (Signed)
No change for today's visit.   You can request that your records be transferred to another clinic.

## 2014-12-28 NOTE — Telephone Encounter (Signed)
Discharge from clinic immediately.  See OV note. Thanks.

## 2014-12-31 ENCOUNTER — Telehealth: Payer: Self-pay | Admitting: Family Medicine

## 2014-12-31 ENCOUNTER — Encounter: Payer: Self-pay | Admitting: Family Medicine

## 2014-12-31 NOTE — Telephone Encounter (Signed)
Letter for discharge will be mailed to patient today.

## 2014-12-31 NOTE — Telephone Encounter (Addendum)
Patient dismissed from [facility] by Crawford GivensGraham Duncan MD , effective December 31, 2014. Dismissal letter sent out by certified / registered mail. DAJ  Received signed domestic return receipt verifying delivery of certified letter on January 17, 2015. Article number [id number] [phone] DAJ

## 2015-02-14 ENCOUNTER — Other Ambulatory Visit: Payer: Self-pay | Admitting: Family Medicine

## 2015-02-27 ENCOUNTER — Encounter: Payer: Self-pay | Admitting: Family Medicine

## 2015-03-01 ENCOUNTER — Ambulatory Visit (INDEPENDENT_AMBULATORY_CARE_PROVIDER_SITE_OTHER): Payer: BLUE CROSS/BLUE SHIELD | Admitting: Family Medicine

## 2015-03-01 ENCOUNTER — Encounter: Payer: Self-pay | Admitting: Family Medicine

## 2015-03-01 VITALS — BP 122/74 | HR 88 | Temp 98.7°F | Resp 16 | Ht 66.0 in | Wt 210.0 lb

## 2015-03-01 DIAGNOSIS — I2510 Atherosclerotic heart disease of native coronary artery without angina pectoris: Secondary | ICD-10-CM

## 2015-03-01 DIAGNOSIS — Z125 Encounter for screening for malignant neoplasm of prostate: Secondary | ICD-10-CM | POA: Diagnosis not present

## 2015-03-01 DIAGNOSIS — Z1211 Encounter for screening for malignant neoplasm of colon: Secondary | ICD-10-CM | POA: Diagnosis not present

## 2015-03-01 DIAGNOSIS — I10 Essential (primary) hypertension: Secondary | ICD-10-CM

## 2015-03-01 DIAGNOSIS — I2584 Coronary atherosclerosis due to calcified coronary lesion: Secondary | ICD-10-CM

## 2015-03-01 DIAGNOSIS — E119 Type 2 diabetes mellitus without complications: Secondary | ICD-10-CM

## 2015-03-01 MED ORDER — LISINOPRIL 20 MG PO TABS: 20.0000 mg | ORAL_TABLET | Freq: Every day | ORAL | Status: DC

## 2015-03-01 MED ORDER — SERTRALINE HCL 50 MG PO TABS: 50.0000 mg | ORAL_TABLET | Freq: Every day | ORAL | Status: DC

## 2015-03-01 MED ORDER — LEVOTHYROXINE SODIUM 175 MCG PO TABS: 175.0000 ug | ORAL_TABLET | Freq: Every day | ORAL | Status: DC

## 2015-03-01 NOTE — Progress Notes (Signed)
Subjective:    Patient ID: Shaun Gomez, male    DOB: Jan 23, 1951, [age] y.o.   MRN: [id number]  HPI Patient is here today to establish care. He has a past medical history of coronary artery disease. He also has a history of hypothyroidism, hyperlipidemia, hypertension, and borderline diabetes mellitus type 2. Overall he is doing well with no complaints. He has never had a colonoscopy. He is overdue for a prostate exam. He is due for Pneumovax 23, Prevnar 13, and the shingles vaccine.  He would like to wean off the Zoloft. His biggest concern with Zoloft is at least him feeling fatigued and he is not sure that he needs the medication. If he does need the medication he would be interested in possibly switching to Effexor to see if this will help with his apathy and his lack of energy but at the present time he would like to wean off of it as he is taking this medication for decades and he is not sure that he truly needs it but he does believe it is causing him to feel tired. Past Medical History  Diagnosis Date  . Depression 07/1993  . Hyperlipidemia 02/1996  . Hypertension pre 08/25/1996  . Thyroid disease 07/1993    Hypothyroidism  . OSA on CPAP   . CAD (coronary artery disease)   . Diabetes mellitus without complication    Past Surgical History  Procedure Laterality Date  . Appendectomy  3 YOA  . Coronary artery bypass graft  02/1996    X 2 EF 30%  . Stress cardiolite  03/1996    EF 30%  . Sleep study  01/03/2006    Severe sleep apnea - 88 events per hour )2 sats down to 50% ?  Marland Kitchen. Mi s/p caverject injection  02/1996  . Retinal detachment surgery    . Cataract extraction, bilateral     Current Outpatient Prescriptions on File Prior to Visit  Medication Sig Dispense Refill  . Ascorbic Acid (VITAMIN C) 1000 MG tablet Take 1,000 mg by mouth daily.    Marland Kitchen. aspirin 325 MG tablet Take 325 mg by mouth daily.    . cholecalciferol (VITAMIN D) 1000 UNITS tablet Take 1,000 Units by mouth daily.      . Multiple Vitamin (MULTIVITAMIN) tablet Take 1 tablet by mouth daily.    . vitamin B-12 (CYANOCOBALAMIN) 1000 MCG tablet Take 1,000 mcg by mouth daily.     No current facility-administered medications on file prior to visit.   Allergies  Allergen Reactions  . Caverject [Alprostadil]     MI after injection   History   Social History  . Marital Status: Married    Spouse Name: N/A  . Number of Children: 0  . Years of Education: N/A   Occupational History  . 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 Smoking status: Former Smoker -- 2.00 packs/day for 15 years    Types: Cigarettes  . Smokeless tobacco: Not on file     Comment: Quit 1983  . Alcohol Use: Yes     Comment: case of beer   . Drug Use: Not on file  . Sexual Activity: Not on file   Other Topics Concern  . 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 Hypertension Mother   . Parkinsonism Mother   . Dementia Mother   . Diabetes Mother   . Stroke Father [id number]  . Depression Sister   . Anxiety disorder Sister   . Heart disease Brother [id number]    CAD  . Hyperlipidemia Brother   . Hypertension Brother   . Arthritis Neg Hx   . Cancer Neg Hx   . Alcohol abuse Neg Hx   . Drug abuse Neg Hx   . Colon cancer Neg Hx   . Prostate cancer Neg Hx   . Heart disease Brother [id number]    CAD      Review of Systems  All other systems reviewed and are negative.      Objective:   Physical Exam  Constitutional: He is oriented to person, place, and time. He appears well-developed and well-nourished. No distress.  HENT:  Head: Normocephalic and atraumatic.  Right Ear: External ear normal.  Left Ear: External ear normal.  Nose: Nose normal.  Mouth/Throat: Oropharynx is clear  and moist. No oropharyngeal exudate.  Eyes: Conjunctivae and EOM are normal. Pupils are equal, round, and reactive to light. No scleral icterus.  Neck: Neck supple. No JVD present. No thyromegaly present.  Cardiovascular: Normal rate, regular rhythm and normal heart sounds.   No murmur heard. Pulmonary/Chest: Effort normal and breath sounds normal. No respiratory distress. He has no wheezes. He has no rales.  Abdominal: Soft. Bowel sounds are normal. He exhibits no distension. There is no tenderness. There is no rebound and no guarding.  Musculoskeletal: He exhibits no edema.  Lymphadenopathy:    He has no cervical adenopathy.  Neurological: He is alert and oriented to person, place, and time. He has normal reflexes. He displays normal reflexes. No cranial nerve deficit. He exhibits normal muscle tone. Coordination normal.  Skin: He is not diaphoretic.  Vitals reviewed.         Assessment & Plan:  Colon cancer screening - Plan: Fecal occult blood, imunochemical, Fecal occult blood, imunochemical, Fecal occult blood, imunochemical  Type 2 diabetes mellitus without complication - Plan: COMPLETE METABOLIC PANEL WITH GFR, CBC with Differential/Platelet, Lipid panel, Hemoglobin A1c, Microalbumin, urine  Coronary artery disease due to calcified coronary lesion - Plan: COMPLETE METABOLIC PANEL WITH GFR, CBC with Differential/Platelet, Lipid panel  Benign essential HTN  Patient's blood pressure today is acceptable. I have recommended Pneumovax 23 and he will receive that in the future but he does not want that today. I have recommended Prevnar 13 as well as the Zostavax. I have recommended a colonoscopy. He declines this but he will consent to fecal occult blood cards. I recommended a prostate exam but he declined this as well. He would consent to a PSA when he returns for blood work in the future. I will check a fasting lipid panel. Goal LDL cholesterol is less than 70. I will also check a  hemoglobin A1c. Goal hemoglobin A1c is less than 6.5. I recommended diet and exercise and weight loss as a means to help reduce his blood sugar. I will also discuss ways to wean off Zoloft but if the depression returns I would try Effexor.

## 2015-04-25 ENCOUNTER — Other Ambulatory Visit: Payer: BLUE CROSS/BLUE SHIELD

## 2015-04-25 LAB — CBC WITH DIFFERENTIAL/PLATELET
Basophils Absolute: 0.1 10*3/uL (ref 0.0–0.1)
Basophils Relative: 1 % (ref 0–1)
Eosinophils Absolute: 0.1 10*3/uL (ref 0.0–0.7)
Eosinophils Relative: 2 % (ref 0–5)
HCT: 43.5 % (ref 39.0–52.0)
Hemoglobin: 15.1 g/dL (ref 13.0–17.0)
Lymphocytes Relative: 40 % (ref 12–46)
Lymphs Abs: 2.9 10*3/uL (ref 0.7–4.0)
MCH: 35.2 pg — ABNORMAL HIGH (ref 26.0–34.0)
MCHC: 34.7 g/dL (ref 30.0–36.0)
MCV: 101.4 fL — ABNORMAL HIGH (ref 78.0–100.0)
MPV: 10.3 fL (ref 8.6–12.4)
Monocytes Absolute: 1.4 10*3/uL — ABNORMAL HIGH (ref 0.1–1.0)
Monocytes Relative: 20 % — ABNORMAL HIGH (ref 3–12)
Neutro Abs: 2.7 10*3/uL (ref 1.7–7.7)
Neutrophils Relative %: 37 % — ABNORMAL LOW (ref 43–77)
Platelets: 239 10*3/uL (ref 150–400)
RBC: 4.29 MIL/uL (ref 4.22–5.81)
RDW: 13.4 % (ref 11.5–15.5)
WBC: 7.2 10*3/uL (ref 4.0–10.5)

## 2015-04-25 LAB — COMPLETE METABOLIC PANEL WITH GFR
ALT: 68 U/L — ABNORMAL HIGH (ref 0–53)
AST: 37 U/L (ref 0–37)
Albumin: 3.8 g/dL (ref 3.5–5.2)
Alkaline Phosphatase: 76 U/L (ref 39–117)
BUN: 20 mg/dL (ref 6–23)
CO2: 25 mEq/L (ref 19–32)
Calcium: 8.5 mg/dL (ref 8.4–10.5)
Chloride: 104 mEq/L (ref 96–112)
Creat: 0.98 mg/dL (ref 0.50–1.35)
GFR, Est African American: >89 mL/min
GFR, Est Non African American: 81 mL/min
Glucose, Bld: 120 mg/dL — ABNORMAL HIGH (ref 70–99)
Potassium: 4.9 mEq/L (ref 3.5–5.3)
Sodium: 139 mEq/L (ref 135–145)
Total Bilirubin: 0.3 mg/dL (ref 0.2–1.2)
Total Protein: 6.9 g/dL (ref 6.0–8.3)

## 2015-04-25 LAB — LIPID PANEL
Cholesterol: 156 mg/dL (ref 0–200)
HDL: 45 mg/dL (ref >=40)
LDL Cholesterol: 82 mg/dL (ref 0–99)
Total CHOL/HDL Ratio: 3.5 Ratio
Triglycerides: 146 mg/dL (ref <150)
VLDL: 29 mg/dL (ref 0–40)

## 2015-04-25 LAB — HEMOGLOBIN A1C
Hgb A1c MFr Bld: 6.5 % — ABNORMAL HIGH (ref <5.7)
Mean Plasma Glucose: 140 mg/dL — ABNORMAL HIGH (ref <117)

## 2015-04-26 LAB — MICROALBUMIN, URINE: Microalb, Ur: 0.5 mg/dL (ref <2.0)

## 2015-04-26 LAB — FECAL OCCULT BLOOD, IMMUNOCHEMICAL
Fecal Occult Blood: NEGATIVE
Fecal Occult Blood: NEGATIVE
Fecal Occult Blood: NEGATIVE

## 2015-04-26 LAB — PSA, MEDICARE: PSA: 0.31 ng/mL (ref <=4.00)

## 2015-04-30 ENCOUNTER — Encounter: Payer: Self-pay | Admitting: Family Medicine

## 2015-05-07 ENCOUNTER — Encounter: Payer: Self-pay | Admitting: Family Medicine

## 2015-05-08 ENCOUNTER — Telehealth: Payer: Self-pay | Admitting: Family Medicine

## 2015-05-08 NOTE — Telephone Encounter (Signed)
Order faxed to [facility] at [phone] for CPAP machine, mask and tubing supplies along with demographics.

## 2015-05-13 ENCOUNTER — Other Ambulatory Visit: Payer: Self-pay | Admitting: Family Medicine

## 2015-05-13 MED ORDER — SERTRALINE HCL 50 MG PO TABS: 50.0000 mg | ORAL_TABLET | Freq: Every day | ORAL | Status: DC

## 2015-05-13 NOTE — Telephone Encounter (Signed)
States [facility] does not [facility] medication. Pt told Dr Tanya NonesPickard authorized 1 year worth of refills in May.  He is going to call them back.  [facility] local [facility] since he is out.

## 2016-02-02 ENCOUNTER — Other Ambulatory Visit: Payer: Self-pay | Admitting: Family Medicine

## 2016-02-03 ENCOUNTER — Encounter: Payer: Self-pay | Admitting: Family Medicine

## 2016-02-13 ENCOUNTER — Other Ambulatory Visit: Payer: Self-pay | Admitting: Family Medicine

## 2016-02-13 NOTE — Telephone Encounter (Signed)
Refill appropriate and filled per protocol. 

## 2016-05-13 ENCOUNTER — Encounter: Payer: Self-pay | Admitting: Family Medicine

## 2016-05-13 ENCOUNTER — Other Ambulatory Visit: Payer: Self-pay | Admitting: Family Medicine

## 2016-05-20 ENCOUNTER — Telehealth: Payer: Self-pay | Admitting: Family Medicine

## 2016-05-21 ENCOUNTER — Encounter: Payer: Self-pay | Admitting: Family Medicine

## 2016-05-21 ENCOUNTER — Ambulatory Visit (INDEPENDENT_AMBULATORY_CARE_PROVIDER_SITE_OTHER): Payer: BLUE CROSS/BLUE SHIELD | Admitting: Family Medicine

## 2016-05-21 VITALS — BP 118/76 | HR 82 | Temp 98.8°F | Resp 18 | Wt 209.0 lb

## 2016-05-21 DIAGNOSIS — I10 Essential (primary) hypertension: Secondary | ICD-10-CM | POA: Diagnosis not present

## 2016-05-21 DIAGNOSIS — Z125 Encounter for screening for malignant neoplasm of prostate: Secondary | ICD-10-CM

## 2016-05-21 DIAGNOSIS — E038 Other specified hypothyroidism: Secondary | ICD-10-CM | POA: Diagnosis not present

## 2016-05-21 DIAGNOSIS — B029 Zoster without complications: Secondary | ICD-10-CM | POA: Diagnosis not present

## 2016-05-21 DIAGNOSIS — E119 Type 2 diabetes mellitus without complications: Secondary | ICD-10-CM | POA: Diagnosis not present

## 2016-05-21 LAB — CBC WITH DIFFERENTIAL/PLATELET
Basophils Absolute: 56 cells/uL (ref 0–200)
Basophils Relative: 1 %
Eosinophils Absolute: 56 cells/uL (ref 15–500)
Eosinophils Relative: 1 %
HCT: 45.7 % (ref 38.5–50.0)
Hemoglobin: 15.7 g/dL (ref 13.0–17.0)
Lymphocytes Relative: 31 %
Lymphs Abs: 1736 cells/uL (ref 850–3900)
MCH: 34.7 pg — ABNORMAL HIGH (ref 27.0–33.0)
MCHC: 34.4 g/dL (ref 32.0–36.0)
MCV: 100.9 fL — ABNORMAL HIGH (ref 80.0–100.0)
MPV: 10.2 fL (ref 7.5–12.5)
Monocytes Absolute: 1344 cells/uL — ABNORMAL HIGH (ref 200–950)
Monocytes Relative: 24 %
Neutro Abs: 2408 cells/uL (ref 1500–7800)
Neutrophils Relative %: 43 %
Platelets: 214 10*3/uL (ref 140–400)
RBC: 4.53 MIL/uL (ref 4.20–5.80)
RDW: 13.4 % (ref 11.0–15.0)
WBC: 5.6 10*3/uL (ref 3.8–10.8)

## 2016-05-21 LAB — COMPLETE METABOLIC PANEL WITH GFR
ALT: 121 U/L — ABNORMAL HIGH (ref 9–46)
AST: 76 U/L — ABNORMAL HIGH (ref 10–35)
Albumin: 4 g/dL (ref 3.6–5.1)
Alkaline Phosphatase: 71 U/L (ref 40–115)
BUN: 14 mg/dL (ref 7–25)
CO2: 23 mmol/L (ref 20–31)
Calcium: 9.2 mg/dL (ref 8.6–10.3)
Chloride: 99 mmol/L (ref 98–110)
Creat: 1.11 mg/dL (ref 0.70–1.25)
GFR, Est African American: 80 mL/min (ref >=60)
GFR, Est Non African American: 69 mL/min (ref >=60)
Glucose, Bld: 119 mg/dL — ABNORMAL HIGH (ref 70–99)
Potassium: 4.3 mmol/L (ref 3.5–5.3)
Sodium: 135 mmol/L (ref 135–146)
Total Bilirubin: 0.6 mg/dL (ref 0.2–1.2)
Total Protein: 7.6 g/dL (ref 6.1–8.1)

## 2016-05-21 LAB — LIPID PANEL
Cholesterol: 158 mg/dL (ref 125–200)
HDL: 51 mg/dL (ref >=40)
LDL Cholesterol: 84 mg/dL (ref <130)
Total CHOL/HDL Ratio: 3.1 Ratio (ref <=5.0)
Triglycerides: 114 mg/dL (ref <150)
VLDL: 23 mg/dL (ref <30)

## 2016-05-21 LAB — TSH: TSH: 2.39 mIU/L (ref 0.40–4.50)

## 2016-05-21 LAB — HEMOGLOBIN A1C
Hgb A1c MFr Bld: 6.9 % — ABNORMAL HIGH (ref <5.7)
Mean Plasma Glucose: 151 mg/dL

## 2016-05-21 MED ORDER — OXYCODONE-ACETAMINOPHEN 5-325 MG PO TABS: 1.0000 | ORAL_TABLET | Freq: Four times a day (QID) | ORAL | 0 refills | Status: DC | PRN

## 2016-05-21 MED ORDER — VALACYCLOVIR HCL 1 G PO TABS: 1000.0000 mg | ORAL_TABLET | Freq: Three times a day (TID) | ORAL | 0 refills | Status: DC

## 2016-05-21 NOTE — Progress Notes (Signed)
Subjective:    Patient ID: Shaun Gomez, male    DOB: Oct 23, 1951, 65 y.o.   MRN: [id number]  HPI  5/16 Patient is here today to establish care. He has a past medical history of coronary artery disease. He also has a history of hypothyroidism, hyperlipidemia, hypertension, and borderline diabetes mellitus type 2. Overall he is doing well with no complaints. He has never had a colonoscopy. He is overdue for a prostate exam. He is due for Pneumovax 23, Prevnar 13, and the shingles vaccine.  He would like to wean off the Zoloft. His biggest concern with Zoloft is at least him feeling fatigued and he is not sure that he needs the medication. If he does need the medication he would be interested in possibly switching to Effexor to see if this will help with his apathy and his lack of energy but at the present time he would like to wean off of it as he is taking this medication for decades and he is not sure that he truly needs it but he does believe it is causing him to feel tired.  At that time, my plan was: Patient's blood pressure today is acceptable. I have recommended Pneumovax 23 and he will receive that in the future but he does not want that today. I have recommended Prevnar 13 as well as the Zostavax. I have recommended a colonoscopy. He declines this but he will consent to fecal occult blood cards. I recommended a prostate exam but he declined this as well. He would consent to a PSA when he returns for blood work in the future. I will check a fasting lipid panel. Goal LDL cholesterol is less than 70. I will also check a hemoglobin A1c. Goal hemoglobin A1c is less than 6.5. I recommended diet and exercise and weight loss as a means to help reduce his blood sugar. I will also discuss ways to wean off Zoloft but if the depression returns I would try Effexor.  05/21/16 Patient has not been seen since. He has a history of medical noncompliance. Although very polite he is not checking his blood sugars or  his blood pressure. He denies any chest pain shortness of breath or dyspnea on exertion. He is overdue for fasting lab work to monitor his cholesterol, his hemoglobin A1c, and his TSH. This morning he woke up with a painful rash on the left chest underneath his left breast. The rash consists of a linear grouping of erythematous vesicles clustered together and lines consistent with shingles. It is painful and burns Past Medical History:  Diagnosis Date  . CAD (coronary artery disease)   . Depression 07/1993  . Diabetes mellitus without complication (HCC)   . Hyperlipidemia 02/1996  . Hypertension pre 08/25/1996  . OSA on CPAP   . Thyroid disease 07/1993   Hypothyroidism   Past Surgical History:  Procedure Laterality Date  . APPENDECTOMY  3 YOA  . CATARACT EXTRACTION, BILATERAL    . CORONARY ARTERY BYPASS GRAFT  02/1996   X 2 EF 30%  . MI S/P Caverject Injection  02/1996  . RETINAL DETACHMENT SURGERY    . Sleep Study  01/03/2006   Severe sleep apnea - 88 events per hour )2 sats down to 50% ?  Marland Kitchen Stress Cardiolite  03/1996   EF 30%   Current Outpatient Prescriptions on File Prior to Visit  Medication Sig Dispense Refill  . Ascorbic Acid (VITAMIN C) 1000 MG tablet Take 1,000 mg by mouth daily.    Marland Kitchen  aspirin 325 MG tablet Take 325 mg by mouth daily.    . cholecalciferol (VITAMIN D) 1000 UNITS tablet Take 1,000 Units by mouth daily.    Marland Kitchen levothyroxine (SYNTHROID) 175 MCG tablet Take 1 tablet (175 mcg total) by mouth daily before breakfast. 90 tablet 0  . lisinopril (PRINIVIL,ZESTRIL) 20 MG tablet TAKE 1 TABLET DAILY 90 tablet 3  . Multiple Vitamin (MULTIVITAMIN) tablet Take 1 tablet by mouth daily.    . sertraline (ZOLOFT) 50 MG tablet Take 1 tablet (50 mg total) by mouth daily. 30 tablet 0  . SYNTHROID 175 MCG tablet TAKE 1 TABLET DAILY BEFORE BREAKFAST 90 tablet 0  . vitamin B-12 (CYANOCOBALAMIN) 1000 MCG tablet Take 1,000 mcg by mouth daily.     No current facility-administered  medications on file prior to visit.    Allergies  Allergen Reactions  . Caverject [Alprostadil]     MI after injection   Social History   Social History  . Marital status: Married    Spouse name: N/A  . Number of children: 0  . Years of education: N/A   Occupational History  . 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 Smoking status: Former Smoker    Packs/day: 2.00    Years: 15.00    Types: Cigarettes  . Smokeless tobacco: Not on file     Comment: Quit 1983  . Alcohol use Yes     Comment: case of beer   . Drug use: Unknown  . Sexual activity: Not on file   Other Topics Concern  . Not on file   Social History Narrative   Re-married, lives with wife   2 step-children, 2 granddaughters   Working at [employer] in [employer], Materials engineer   Family History  Problem Relation Age of Onset  . Hypertension Mother   . Parkinsonism Mother   . Dementia Mother   . Diabetes Mother   . Stroke Father 47  . Depression Sister   . Anxiety disorder Sister   . Heart disease Brother 22    CAD  . Hyperlipidemia Brother   . Hypertension Brother   . Arthritis Neg Hx   . Cancer Neg Hx   . Alcohol abuse Neg Hx   . Drug abuse Neg Hx   . Colon cancer Neg Hx   . Prostate cancer Neg Hx   . Heart disease Brother 36    CAD      Review of Systems  All other systems reviewed and are negative.      Objective:   Physical Exam  Constitutional: He is oriented to person, place, and time. He appears well-developed and well-nourished. No distress.  HENT:  Head: Normocephalic and atraumatic.  Right Ear: External ear normal.  Left Ear: External ear normal.  Nose: Nose normal.  Mouth/Throat: Oropharynx is clear and moist. No oropharyngeal exudate.  Eyes: Conjunctivae and EOM are normal. Pupils are equal, round, and reactive to light. No scleral icterus.  Neck: Neck supple. No JVD present. No  thyromegaly present.  Cardiovascular: Normal rate, regular rhythm and normal heart sounds.   No murmur heard. Pulmonary/Chest: Effort normal and breath sounds normal. No respiratory distress. He has no wheezes. He has no rales.  Abdominal: Soft. Bowel sounds are normal. He exhibits no distension. There is no tenderness. There is no rebound and no guarding.  Musculoskeletal: He exhibits no edema.  Lymphadenopathy:  He has no cervical adenopathy.  Neurological: He is alert and oriented to person, place, and time. He has normal reflexes. No cranial nerve deficit. He exhibits normal muscle tone. Coordination normal.  Skin: Rash noted. He is not diaphoretic. There is erythema.  Vitals reviewed.         Assessment & Plan:  Type 2 diabetes mellitus without complication, without long-term current use of insulin (HCC) - Plan: CBC with Differential/Platelet, COMPLETE METABOLIC PANEL WITH GFR, Lipid panel, Hemoglobin A1c  Benign essential HTN  Prostate cancer screening  Other specified hypothyroidism - Plan: TSH  Shingles outbreak - Plan: oxyCODONE-acetaminophen (ROXICET) 5-325 MG tablet, valACYclovir (VALTREX) 1000 MG tablet, DISCONTINUED: valACYclovir (VALTREX) 1000 MG tablet  Begin Valtrex 1000 mg by mouth 3 times a day for 7 days. Use oxycodone 5/325 one to 2 tablets every 6 hours as needed for pain. His blood pressures well controlled today. I will check a TSH to monitor his thyroid medication. I will check a fasting lipid panel. Given his history of CAD, I would recommend an LDL cholesterol less than 70. I will check a hemoglobin A1c. His goal hemoglobin A1c is less than 6.5. Diabetic foot exam is performed today and is normal.

## 2016-06-05 ENCOUNTER — Other Ambulatory Visit: Payer: Self-pay | Admitting: Family Medicine

## 2016-06-05 DIAGNOSIS — R7989 Other specified abnormal findings of blood chemistry: Secondary | ICD-10-CM

## 2016-06-05 DIAGNOSIS — R945 Abnormal results of liver function studies: Principal | ICD-10-CM

## 2016-06-12 ENCOUNTER — Other Ambulatory Visit: Payer: BLUE CROSS/BLUE SHIELD

## 2016-06-12 DIAGNOSIS — R7989 Other specified abnormal findings of blood chemistry: Secondary | ICD-10-CM

## 2016-06-12 DIAGNOSIS — R945 Abnormal results of liver function studies: Principal | ICD-10-CM

## 2016-06-12 LAB — ACUTE HEP PANEL AND HEP B SURFACE AB
HCV Ab: NEGATIVE
Hep A IgM: NONREACTIVE
Hep B C IgM: NONREACTIVE
Hep B S Ab: NEGATIVE
Hepatitis B Surface Ag: NEGATIVE

## 2016-06-17 ENCOUNTER — Encounter: Payer: Self-pay | Admitting: Family Medicine

## 2016-07-07 ENCOUNTER — Ambulatory Visit
Admission: RE | Admit: 2016-07-07 | Discharge: 2016-07-07 | Disposition: A | Discharge status: Home / Self Care | Payer: BLUE CROSS/BLUE SHIELD | Source: Ambulatory Visit | Attending: Family Medicine | Admitting: Family Medicine

## 2016-07-07 ENCOUNTER — Other Ambulatory Visit: Payer: BLUE CROSS/BLUE SHIELD

## 2016-07-07 DIAGNOSIS — R7989 Other specified abnormal findings of blood chemistry: Secondary | ICD-10-CM

## 2016-07-07 DIAGNOSIS — R945 Abnormal results of liver function studies: Principal | ICD-10-CM

## 2016-07-08 ENCOUNTER — Encounter: Payer: Self-pay | Admitting: Family Medicine

## 2016-07-14 ENCOUNTER — Other Ambulatory Visit: Payer: Self-pay | Admitting: Family Medicine

## 2016-07-31 ENCOUNTER — Other Ambulatory Visit: Payer: Self-pay | Admitting: Family Medicine

## 2016-07-31 NOTE — Telephone Encounter (Signed)
Rx refilled per protocol 

## 2017-01-29 ENCOUNTER — Other Ambulatory Visit: Payer: Self-pay | Admitting: Family Medicine

## 2017-01-29 NOTE — Telephone Encounter (Signed)
Refill appropriate 

## 2017-05-01 ENCOUNTER — Other Ambulatory Visit: Payer: Self-pay | Admitting: Family Medicine

## 2017-05-03 ENCOUNTER — Encounter: Payer: Self-pay | Admitting: Family Medicine

## 2017-06-29 ENCOUNTER — Other Ambulatory Visit: Payer: Self-pay | Admitting: Family Medicine

## 2017-08-30 DIAGNOSIS — H40013 Open angle with borderline findings, low risk, bilateral: Secondary | ICD-10-CM | POA: Diagnosis not present

## 2017-08-30 DIAGNOSIS — H52203 Unspecified astigmatism, bilateral: Secondary | ICD-10-CM | POA: Diagnosis not present

## 2017-08-30 DIAGNOSIS — H31001 Unspecified chorioretinal scars, right eye: Secondary | ICD-10-CM | POA: Diagnosis not present

## 2017-08-30 DIAGNOSIS — H43812 Vitreous degeneration, left eye: Secondary | ICD-10-CM | POA: Diagnosis not present

## 2017-09-23 ENCOUNTER — Telehealth: Payer: Self-pay | Admitting: Family Medicine

## 2017-09-23 MED ORDER — SERTRALINE HCL 50 MG PO TABS: 50.0000 mg | ORAL_TABLET | Freq: Every day | ORAL | 0 refills | Status: DC

## 2017-09-23 NOTE — Telephone Encounter (Signed)
Pts wife called and scheduled an appointment states you said you would call in 30 day supply of zoloft to [facility] once scheduled. Please go ahead and call in.

## 2017-09-23 NOTE — Telephone Encounter (Signed)
Medication called/sent to requested pharmacy  

## 2017-10-08 ENCOUNTER — Ambulatory Visit (INDEPENDENT_AMBULATORY_CARE_PROVIDER_SITE_OTHER): Payer: Medicare Other | Admitting: Family Medicine

## 2017-10-08 ENCOUNTER — Encounter: Payer: Self-pay | Admitting: Family Medicine

## 2017-10-08 VITALS — BP 130/80 | HR 88 | Temp 99.0°F | Resp 14 | Ht 66.0 in | Wt 201.0 lb

## 2017-10-08 DIAGNOSIS — R7303 Prediabetes: Secondary | ICD-10-CM

## 2017-10-08 DIAGNOSIS — Z13220 Encounter for screening for lipoid disorders: Secondary | ICD-10-CM | POA: Diagnosis not present

## 2017-10-08 DIAGNOSIS — Z125 Encounter for screening for malignant neoplasm of prostate: Secondary | ICD-10-CM | POA: Diagnosis not present

## 2017-10-08 DIAGNOSIS — E039 Hypothyroidism, unspecified: Secondary | ICD-10-CM

## 2017-10-08 NOTE — Progress Notes (Signed)
Subjective:    Patient ID: Shaun Gomez, male    DOB: 05/14/1951, 66 y.o.   MRN: [id number]  HPI  5/16 Patient is here today to establish care. He has a past medical history of coronary artery disease. He also has a history of hypothyroidism, hyperlipidemia, hypertension, and borderline diabetes mellitus type 2. Overall he is doing well with no complaints. He has never had a colonoscopy. He is overdue for a prostate exam. He is due for Pneumovax 23, Prevnar 13, and the shingles vaccine.  He would like to wean off the Zoloft. His biggest concern with Zoloft is at least him feeling fatigued and he is not sure that he needs the medication. If he does need the medication he would be interested in possibly switching to Effexor to see if this will help with his apathy and his lack of energy but at the present time he would like to wean off of it as he is taking this medication for decades and he is not sure that he truly needs it but he does believe it is causing him to feel tired.  At that time, my plan was: Patient's blood pressure today is acceptable. I have recommended Pneumovax 23 and he will receive that in the future but he does not want that today. I have recommended Prevnar 13 as well as the Zostavax. I have recommended a colonoscopy. He declines this but he will consent to fecal occult blood cards. I recommended a prostate exam but he declined this as well. He would consent to a PSA when he returns for blood work in the future. I will check a fasting lipid panel. Goal LDL cholesterol is less than 70. I will also check a hemoglobin A1c. Goal hemoglobin A1c is less than 6.5. I recommended diet and exercise and weight loss as a means to help reduce his blood sugar. I will also discuss ways to wean off Zoloft but if the depression returns I would try Effexor.  05/21/16 Patient has not been seen since. He has a history of medical noncompliance. Although very polite he is not checking his blood sugars or  his blood pressure. He denies any chest pain shortness of breath or dyspnea on exertion. He is overdue for fasting lab work to monitor his cholesterol, his hemoglobin A1c, and his TSH. This morning he woke up with a painful rash on the left chest underneath his left breast. The rash consists of a linear grouping of erythematous vesicles clustered together and lines consistent with shingles. It is painful and burns.  At that time, my plan was: Begin Valtrex 1000 mg by mouth 3 times a day for 7 days. Use oxycodone 5/325 one to 2 tablets every 6 hours as needed for pain. His blood pressures well controlled today. I will check a TSH to monitor his thyroid medication. I will check a fasting lipid panel. Given his history of CAD, I would recommend an LDL cholesterol less than 70. I will check a hemoglobin A1c. His goal hemoglobin A1c is less than 6.5. Diabetic foot exam is performed today and is normal.  10/08/17 Patient is a very pleasant 66 year old white male who is also extremely noncompliant.  He has not been seen in more than a year.  He refuses a flu shot today.  He refuses Pneumovax 23.  He refuses Prevnar 13.  He refuses the shingles vaccine.  He is here today because we would not refill his medication without an office visit.  He  has a history of coronary artery disease, 2 diabetes mellitus, hypothyroidism, and hypertension.  His blood pressure today is well controlled at 130/80.  He denies any chest pain shortness of breath or dyspnea on exertion.  He is not checking his blood sugar.  He denies any history of hypoglycemia.  He denies any polyuria, polydipsia, or blurry vision. Past Medical History:  Diagnosis Date  . CAD (coronary artery disease)   . Depression 07/1993  . Diabetes mellitus without complication (HCC)   . Hyperlipidemia 02/1996  . Hypertension pre 08/25/1996  . OSA on CPAP   . Thyroid disease 07/1993   Hypothyroidism   Past Surgical History:  Procedure Laterality Date  .  APPENDECTOMY  3 YOA  . CATARACT EXTRACTION, BILATERAL    . CORONARY ARTERY BYPASS GRAFT  02/1996   X 2 EF 30%  . MI S/P Caverject Injection  02/1996  . RETINAL DETACHMENT SURGERY    . Sleep Study  01/03/2006   Severe sleep apnea - 88 events per hour )2 sats down to 50% ?  Marland Kitchen. Stress Cardiolite  03/1996   EF 30%   Current Outpatient Medications on File Prior to Visit  Medication Sig Dispense Refill  . Ascorbic Acid (VITAMIN C) 1000 MG tablet Take 1,000 mg by mouth daily.    Marland Kitchen. aspirin 325 MG tablet Take 325 mg by mouth daily.    . cholecalciferol (VITAMIN D) 1000 UNITS tablet Take 1,000 Units by mouth daily.    Marland Kitchen. lisinopril (PRINIVIL,ZESTRIL) 20 MG tablet TAKE 1 TABLET DAILY 90 tablet 0  . Multiple Vitamin (MULTIVITAMIN) tablet Take 1 tablet by mouth daily.    . sertraline (ZOLOFT) 50 MG tablet Take 1 tablet (50 mg total) by mouth daily. 30 tablet 0  . sertraline (ZOLOFT) 50 MG tablet Take 1 tablet (50 mg total) by mouth daily. 30 tablet 0  . SYNTHROID 175 MCG tablet TAKE 1 TABLET DAILY BEFORE BREAKFAST 90 tablet 0  . valACYclovir (VALTREX) 1000 MG tablet Take 1 tablet (1,000 mg total) by mouth 3 (three) times daily. 21 tablet 0  . vitamin B-12 (CYANOCOBALAMIN) 1000 MCG tablet Take 1,000 mcg by mouth daily.     No current facility-administered medications on file prior to visit.    Allergies  Allergen Reactions  . Caverject [Alprostadil]     MI after injection   Social History   Socioeconomic History  . Marital status: Married    Spouse name: Not on file  . Number of children: 0  . Years of education: Not on file  . Highest education level: Not on file  Social Needs  . Financial resource strain: Not on file  . Food insecurity - worry: Not on file  . Food insecurity - inability: Not on file  . Transportation needs - medical: Not on file  . Transportation needs - non-medical: Not on file  Occupational History  . 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 Smoking status: Former Smoker    Packs/day: 2.00    Years: 15.00    Pack years: 30.00    Types: Cigarettes  . Smokeless tobacco: Never Used  . Tobacco comment: Quit 1983  Substance and Sexual Activity  . Alcohol use: Yes    Comment: case of beer   . Drug use: Not on file  . Sexual activity: Not on file  Other Topics Concern  . Not on file  Social History Narrative   Re-married, lives with wife   2 step-children, 2 granddaughters   Working at [employer] in [place], Materials engineer   Family History  Problem Relation Age of Onset  . Hypertension Mother   . Parkinsonism Mother   . Dementia Mother   . Diabetes Mother   . Stroke Father 98  . Depression Sister   . Anxiety disorder Sister   . Heart disease Brother 65       CAD  . Hyperlipidemia Brother   . Hypertension Brother   . Arthritis Neg Hx   . Cancer Neg Hx   . Alcohol abuse Neg Hx   . Drug abuse Neg Hx   . Colon cancer Neg Hx   . Prostate cancer Neg Hx   . Heart disease Brother 40       CAD      Review of Systems  All other systems reviewed and are negative.      Objective:   Physical Exam  Constitutional: He is oriented to person, place, and time. He appears well-developed and well-nourished. No distress.  HENT:  Head: Normocephalic and atraumatic.  Right Ear: External ear normal.  Left Ear: External ear normal.  Nose: Nose normal.  Mouth/Throat: Oropharynx is clear and moist. No oropharyngeal exudate.  Eyes: Conjunctivae and EOM are normal. Pupils are equal, round, and reactive to light. No scleral icterus.  Neck: Neck supple. No JVD present. No thyromegaly present.  Cardiovascular: Normal rate, regular rhythm and normal heart sounds.  No murmur heard. Pulmonary/Chest: Effort normal and breath sounds normal. No respiratory distress. He has no wheezes. He has no rales.  Abdominal: Soft. Bowel sounds are normal. He exhibits no  distension. There is no tenderness. There is no rebound and no guarding.  Musculoskeletal: He exhibits no edema.  Lymphadenopathy:    He has no cervical adenopathy.  Neurological: He is alert and oriented to person, place, and time. He has normal reflexes. No cranial nerve deficit. He exhibits normal muscle tone. Coordination normal.  Skin: He is not diaphoretic.  Vitals reviewed.         Assessment & Plan:  Prediabetes - Plan: CBC with Differential/Platelet, COMPLETE METABOLIC PANEL WITH GFR, Lipid panel, Hemoglobin A1c, Microalbumin, urine  Hypothyroidism, unspecified type - Plan: TSH  Prostate cancer screening - Plan: PSA  Screening cholesterol level - Plan: Lipid panel  Patient is very polite but noncompliant.  He refuses any preventative care including a flu shot, Pneumovax, Prevnar 13, or the shingles vaccine.  He also refuses a colonoscopy.  Diabetic foot exam is normal.  Blood pressure is adequately controlled.  I will check a hemoglobin A1c along with a urine my.  I will also check a fasting lipid panel.  His goal LDL cholesterol is less than 70.  I will also check a TSH to ensure adequate dosage of his Synthroid.  Continue to encourage regular preventative care but the patient declines.

## 2017-10-09 LAB — CBC WITH DIFFERENTIAL/PLATELET
Basophils Absolute: 47 cells/uL (ref 0–200)
Basophils Relative: 0.7 %
Eosinophils Absolute: 161 cells/uL (ref 15–500)
Eosinophils Relative: 2.4 %
HCT: 43.7 % (ref 38.5–50.0)
Hemoglobin: 15.5 g/dL (ref 13.2–17.1)
Lymphs Abs: 2070 cells/uL (ref 850–3900)
MCH: 35.3 pg — ABNORMAL HIGH (ref 27.0–33.0)
MCHC: 35.5 g/dL (ref 32.0–36.0)
MCV: 99.5 fL (ref 80.0–100.0)
MPV: 10.4 fL (ref 7.5–12.5)
Monocytes Relative: 19.9 %
Neutro Abs: 3089 cells/uL (ref 1500–7800)
Neutrophils Relative %: 46.1 %
Platelets: 255 10*3/uL (ref 140–400)
RBC: 4.39 10*6/uL (ref 4.20–5.80)
RDW: 12 % (ref 11.0–15.0)
Total Lymphocyte: 30.9 %
WBC mixed population: 1333 cells/uL — ABNORMAL HIGH (ref 200–950)
WBC: 6.7 10*3/uL (ref 3.8–10.8)

## 2017-10-09 LAB — COMPLETE METABOLIC PANEL WITH GFR
AG Ratio: 1.3 (calc) (ref 1.0–2.5)
ALT: 79 U/L — ABNORMAL HIGH (ref 9–46)
AST: 57 U/L — ABNORMAL HIGH (ref 10–35)
Albumin: 4.3 g/dL (ref 3.6–5.1)
Alkaline phosphatase (APISO): 87 U/L (ref 40–115)
BUN: 22 mg/dL (ref 7–25)
CO2: 26 mmol/L (ref 20–32)
Calcium: 9.7 mg/dL (ref 8.6–10.3)
Chloride: 103 mmol/L (ref 98–110)
Creat: 0.91 mg/dL (ref 0.70–1.25)
GFR, Est African American: 101 mL/min/{1.73_m2} (ref >=60)
GFR, Est Non African American: 87 mL/min/{1.73_m2} (ref >=60)
Globulin: 3.3 g/dL (calc) (ref 1.9–3.7)
Glucose, Bld: 115 mg/dL — ABNORMAL HIGH (ref 65–99)
Potassium: 4.6 mmol/L (ref 3.5–5.3)
Sodium: 138 mmol/L (ref 135–146)
Total Bilirubin: 0.3 mg/dL (ref 0.2–1.2)
Total Protein: 7.6 g/dL (ref 6.1–8.1)

## 2017-10-09 LAB — LIPID PANEL
Cholesterol: 184 mg/dL (ref <200)
HDL: 62 mg/dL (ref >40)
LDL Cholesterol (Calc): 91 mg/dL (calc)
Non-HDL Cholesterol (Calc): 122 mg/dL (calc) (ref <130)
Total CHOL/HDL Ratio: 3 (calc) (ref <5.0)
Triglycerides: 216 mg/dL — ABNORMAL HIGH (ref <150)

## 2017-10-09 LAB — TSH: TSH: 2.59 mIU/L (ref 0.40–4.50)

## 2017-10-09 LAB — MICROALBUMIN, URINE: Microalb, Ur: 1.3 mg/dL

## 2017-10-09 LAB — HEMOGLOBIN A1C
Hgb A1c MFr Bld: 6.1 % of total Hgb — ABNORMAL HIGH (ref <5.7)
Mean Plasma Glucose: 128 (calc)
eAG (mmol/L): 7.1 (calc)

## 2017-10-09 LAB — PSA: PSA: 0.3 ng/mL (ref <=4.0)

## 2017-10-13 ENCOUNTER — Other Ambulatory Visit: Payer: Self-pay | Admitting: Family Medicine

## 2017-10-13 MED ORDER — LEVOTHYROXINE SODIUM 175 MCG PO TABS: 175.0000 ug | ORAL_TABLET | Freq: Every day | ORAL | 3 refills | Status: DC

## 2017-10-13 MED ORDER — SERTRALINE HCL 50 MG PO TABS: 50.0000 mg | ORAL_TABLET | Freq: Every day | ORAL | 0 refills | Status: DC

## 2017-10-13 MED ORDER — LISINOPRIL 20 MG PO TABS: 20.0000 mg | ORAL_TABLET | Freq: Every day | ORAL | 3 refills | Status: DC

## 2017-10-23 ENCOUNTER — Other Ambulatory Visit: Payer: Self-pay | Admitting: Family Medicine

## 2017-11-08 ENCOUNTER — Other Ambulatory Visit: Payer: Self-pay | Admitting: Family Medicine

## 2017-11-24 ENCOUNTER — Other Ambulatory Visit: Payer: Self-pay

## 2017-11-24 ENCOUNTER — Encounter: Payer: Self-pay | Admitting: Family Medicine

## 2017-11-24 ENCOUNTER — Ambulatory Visit (INDEPENDENT_AMBULATORY_CARE_PROVIDER_SITE_OTHER): Payer: Medicare Other | Admitting: Family Medicine

## 2017-11-24 VITALS — BP 138/82 | HR 64 | Temp 98.9°F | Resp 16 | Ht 66.0 in | Wt 206.0 lb

## 2017-11-24 DIAGNOSIS — K5792 Diverticulitis of intestine, part unspecified, without perforation or abscess without bleeding: Secondary | ICD-10-CM | POA: Diagnosis not present

## 2017-11-24 DIAGNOSIS — R1032 Left lower quadrant pain: Secondary | ICD-10-CM

## 2017-11-24 LAB — CBC WITH DIFFERENTIAL/PLATELET
Basophils Absolute: 66 cells/uL (ref 0–200)
Basophils Relative: 0.5 %
Eosinophils Absolute: 66 cells/uL (ref 15–500)
Eosinophils Relative: 0.5 %
HCT: 41.1 % (ref 38.5–50.0)
Hemoglobin: 14.4 g/dL (ref 13.2–17.1)
Lymphs Abs: 2138 cells/uL (ref 850–3900)
MCH: 34.7 pg — ABNORMAL HIGH (ref 27.0–33.0)
MCHC: 35 g/dL (ref 32.0–36.0)
MCV: 99 fL (ref 80.0–100.0)
MPV: 10.2 fL (ref 7.5–12.5)
Monocytes Relative: 16.5 %
Neutro Abs: 8752 cells/uL — ABNORMAL HIGH (ref 1500–7800)
Neutrophils Relative %: 66.3 %
Platelets: 254 10*3/uL (ref 140–400)
RBC: 4.15 10*6/uL — ABNORMAL LOW (ref 4.20–5.80)
RDW: 11.8 % (ref 11.0–15.0)
Total Lymphocyte: 16.2 %
WBC mixed population: 2178 cells/uL — ABNORMAL HIGH (ref 200–950)
WBC: 13.2 10*3/uL — ABNORMAL HIGH (ref 3.8–10.8)

## 2017-11-24 LAB — COMPREHENSIVE METABOLIC PANEL
AG Ratio: 1.2 (calc) (ref 1.0–2.5)
ALT: 71 U/L — ABNORMAL HIGH (ref 9–46)
AST: 48 U/L — ABNORMAL HIGH (ref 10–35)
Albumin: 4 g/dL (ref 3.6–5.1)
Alkaline phosphatase (APISO): 59 U/L (ref 40–115)
BUN: 13 mg/dL (ref 7–25)
CO2: 26 mmol/L (ref 20–32)
Calcium: 9.1 mg/dL (ref 8.6–10.3)
Chloride: 100 mmol/L (ref 98–110)
Creat: 0.95 mg/dL (ref 0.70–1.25)
Globulin: 3.4 g/dL (calc) (ref 1.9–3.7)
Glucose, Bld: 111 mg/dL — ABNORMAL HIGH (ref 65–99)
Potassium: 4.3 mmol/L (ref 3.5–5.3)
Sodium: 135 mmol/L (ref 135–146)
Total Bilirubin: 0.9 mg/dL (ref 0.2–1.2)
Total Protein: 7.4 g/dL (ref 6.1–8.1)

## 2017-11-24 MED ORDER — METRONIDAZOLE 500 MG PO TABS: 500.0000 mg | ORAL_TABLET | Freq: Three times a day (TID) | ORAL | 0 refills | Status: DC

## 2017-11-24 MED ORDER — CIPROFLOXACIN HCL 500 MG PO TABS: 500.0000 mg | ORAL_TABLET | Freq: Two times a day (BID) | ORAL | 0 refills | Status: DC

## 2017-11-24 NOTE — Progress Notes (Signed)
   Subjective:    Patient ID: Shaun Gomez, male    DOB: 1951-08-17, 67 y.o.   MRN: [id number]  Patient presents for Lower Abd Pain (x2 days- lower abd L sided pain- states that he was helping to pull up a well pump and then noted pain in L side of lower abd, then noted increased gas pain in lower abd- loose stools noted on 11/23/2017.)  Pt here with left sided abdominal pain for past 2 says, has a tight gas feeling first then pain settles on that side, on occasion has went toward groin. Denies back pain, has had loose stools for paast 2 days, no blood in stool. No fever, no N/V No recent illness No colonoscopy Reviewed previous Imaging had US abdomen-     Review Of Systems:  GEN- denies fatigue, fever, weight loss,weakness, recent illness HEENT- denies eye drainage, change in vision, nasal discharge, CVS- denies chest pain, palpitations RESP- denies SOB, cough, wheeze ABD- denies N/V,+ change in stools, +abd pain GU- denies dysuria, hematuria, dribbling, incontinence MSK- denies joint pain, muscle aches, injury Neuro- denies headache, dizziness, syncope, seizure activity       Objective:    BP 138/82   Pulse 64   Temp 98.9 F (37.2 C) (Oral)   Resp 16   Ht 5\' 6"  (1.676 m)   Wt 206 lb (93.4 kg)   SpO2 98%   BMI 33.25 kg/m  GEN- NAD, alert and oriented x3,well appearing  HEENT- PERRL, EOMI, non injected sclera, pink conjunctiva, MMM, oropharynx clear CVS- RRR, no murmur RESP-CTAB ABD-NABS,soft,TTP LLQ, mild TTP suprapubic region, no guarding, NT in groin,no bulge palpated, no CVA tenderness  EXT- No edema Pulses- Radial  2+        Assessment & Plan:      Problem List Items Addressed This Visit      Unprioritized   LLQ pain   Relevant Orders   CBC with Differential/Platelet (Completed)   Comprehensive metabolic panel (Completed)    Other Visit Diagnoses    Diverticulitis    -  Primary   Treat for presumptive diverticulitis, he has never had imaging or  scope and recommend he do have colonoscopy done in future. As no red flags of fever vomiting, will start Cipro and Flagyl, push fluids. He states he is still working and wants to continue at this time aDiscussed reasons to go to ER or come back in for visit    Relevant Orders   CBC with Differential/Platelet (Completed)   Comprehensive metabolic panel (Completed)      Note: This dictation was prepared with Dragon dictation along with smaller phrase technology. Any transcriptional errors that result from this process are unintentional.

## 2017-11-24 NOTE — Patient Instructions (Signed)
Push fluids Take antibiotics We will call with lab results  Call if not better on Monday  F/U pending results

## 2017-11-25 ENCOUNTER — Encounter: Payer: Self-pay | Admitting: Family Medicine

## 2017-12-27 ENCOUNTER — Other Ambulatory Visit: Payer: Self-pay | Admitting: Family Medicine

## 2017-12-27 NOTE — Telephone Encounter (Signed)
Patient is needing refill on his sertraline asap if possible  Has questions about the refill and quantity  Please call patients wife at [phone]

## 2017-12-28 MED ORDER — SERTRALINE HCL 50 MG PO TABS: 50.0000 mg | ORAL_TABLET | Freq: Every day | ORAL | 0 refills | Status: DC

## 2017-12-28 NOTE — Telephone Encounter (Signed)
rx sent to pharmacy

## 2018-03-23 DIAGNOSIS — H40013 Open angle with borderline findings, low risk, bilateral: Secondary | ICD-10-CM | POA: Diagnosis not present

## 2018-05-13 ENCOUNTER — Ambulatory Visit (INDEPENDENT_AMBULATORY_CARE_PROVIDER_SITE_OTHER): Payer: Medicare Other

## 2018-05-13 ENCOUNTER — Ambulatory Visit: Payer: Medicare Other

## 2018-05-13 ENCOUNTER — Encounter: Payer: Self-pay | Admitting: Podiatry

## 2018-05-13 ENCOUNTER — Ambulatory Visit (INDEPENDENT_AMBULATORY_CARE_PROVIDER_SITE_OTHER): Payer: Medicare Other | Admitting: Podiatry

## 2018-05-13 ENCOUNTER — Other Ambulatory Visit: Payer: Self-pay | Admitting: Podiatry

## 2018-05-13 DIAGNOSIS — M79671 Pain in right foot: Secondary | ICD-10-CM

## 2018-05-13 DIAGNOSIS — M779 Enthesopathy, unspecified: Secondary | ICD-10-CM

## 2018-05-13 DIAGNOSIS — M205X1 Other deformities of toe(s) (acquired), right foot: Secondary | ICD-10-CM

## 2018-05-13 MED ORDER — TRIAMCINOLONE ACETONIDE 10 MG/ML IJ SUSP
10.0000 mg | Freq: Once | INTRAMUSCULAR | Status: AC
Start: 2018-05-13 — End: 2018-05-13
  Administered 2018-05-13: 10 mg

## 2018-05-13 NOTE — Progress Notes (Signed)
   Subjective:    Patient ID: Shaun Gomez, male    DOB: November 27, 1950, 67 y.o.   MRN: [id number]  HPI    Review of Systems  All other systems reviewed and are negative.      Objective:   Physical Exam        Assessment & Plan:

## 2018-05-13 NOTE — Patient Instructions (Signed)
Hallux Rigidus Hallux rigidus is a type of joint pain or joint disease (arthritis) that affects your big toe (hallux). This condition involves the joint that connects the base of your big toe to the main part of your foot (metatarsophalangeal joint). This condition can cause your big toe to become stiff, painful, and difficult to move. Symptoms may get worse with movement or in cold or damp weather. The condition also gets worse over time. What are the causes? This condition may be caused by having a foot that does not function the way that it should or has an abnormal shape (structural deformity). These foot problems can run in families (be hereditary). This condition can also be caused by:  Injury.  Overuse.  Certain inflammatory diseases, including gout and rheumatoid arthritis.  What increases the risk? This condition is more likely to develop in people who:  Have a foot bone (metatarsal) that is longer or higher than normal.  Have a family history of hallux rigidus.  Have previously injured their big toe.  Have feet that do not have a curve (arch) on the inner side of the foot. This may be called flat feet or fallen arches.  Turn their ankles in when they walk (pronation).  Have rheumatoid arthritis or gout.  Have to stoop down often at work.  What are the signs or symptoms? Symptoms of this condition include:  Big toe pain.  Stiffness and difficulty moving the big toe.  Swelling of the toe and surrounding area.  Bone spurs. These are bony growths that can form on the joint of the big toe.  A limp.  How is this diagnosed? This condition is diagnosed based on a medical history and physical exam. This may include X-rays. How is this treated? Treatment for this condition includes:  Wearing roomy, comfortable shoes that have a large toe box.  Putting orthotic devices in your shoes.  Pain medicines.  Physical therapy.  Icing the injured area.  Alternate  between putting your foot in cold water then warm water.  If your condition is severe, treatment may include:  Corticosteroid injections to relieve pain.  Surgery to remove bone spurs, fuse damaged bones together, or replace the entire joint.  Follow these instructions at home:  Take over-the-counter and prescription medicines only as told by your health care provider.  Do not wear high heels or other restrictive footwear. Wear comfortable, supportive shoes that have a large toe box.  Wear orthotics as told by your health care provider, if this applies.  Put your feet in cold water for 30 seconds, then in warm water for 30 seconds. Alternate between the cold and warm water for 5 minutes. Do this several times a day or as told by your health care provider.  If directed, apply ice to the injured area. ? Put ice in a plastic bag. ? Place a towel between your skin and the bag. ? Leave the ice on for 20 minutes, 2-3 times per day.  Do foot exercises as instructed by your health care provider or a physical therapist.  Keep all follow-up visits as told by your health care provider. This is important. Contact a health care provider if:  You notice bone spurs or growths on or around your big toe.  Your pain does not get better or it gets worse.  You have pain while resting.  You have pain in other parts of your body, such as your back, hip, or knee.  You start to limp.   This information is not intended to replace advice given to you by your health care provider. Make sure you discuss any questions you have with your health care provider. Document Released: 10/12/2005 Document Revised: 03/19/2016 Document Reviewed: 06/19/2015 Elsevier Interactive Patient Education  2018 [employer]  

## 2018-05-13 NOTE — Progress Notes (Signed)
Subjective:   Patient ID: Shaun Gomez, male   DOB: 67 y.o.   MRN: [id number]   HPI Patient states he has exquisite discomfort in his big toe joint right and it is making it hard for him to work and he is been dealing with it for a number of years and is gotten worse over the last year.  He is tried reduced activity wider shoes without relief of symptoms and patient does not smoke currently and likes to be active   Review of Systems  All other systems reviewed and are negative.       Objective:  Physical Exam  Constitutional: He appears well-developed and well-nourished.  Cardiovascular: Intact distal pulses.  Pulmonary/Chest: Effort normal.  Musculoskeletal: Normal range of motion.  Neurological: He is alert.  Skin: Skin is warm.  Nursing note and vitals reviewed.   Neurovascular status intact muscle strength adequate range of motion within normal limits with patient found to have exquisite discomfort first MPJ right with crepitus around the joint and almost no motion of the big toe joint.  There is spurring and it is red on top secondary to pressure from the bone and it is very painful when palpated     Assessment:  Chronic severe hallux limitus rigidus condition right with patient noted to have inflammatory capsulitis right     Plan:  H&P x-rays reviewed condition discussed at great length.  Temporarily I did inject around the big toe joint 3 mg Kenalog 5 mg Xylocaine and I advised on wider shoes and long-term this will require joint implantation procedure.  Patient wants surgery but needs to hold off currently and will be scheduled for outpatient surgery for this condition and will see response to injection  X-ray indicates that there is severe arthritis big toe joint right spurring with narrowing of the joint and flattening of the surface

## 2018-07-18 DIAGNOSIS — H401112 Primary open-angle glaucoma, right eye, moderate stage: Secondary | ICD-10-CM | POA: Diagnosis not present

## 2018-09-06 DIAGNOSIS — H401112 Primary open-angle glaucoma, right eye, moderate stage: Secondary | ICD-10-CM | POA: Diagnosis not present

## 2018-09-07 ENCOUNTER — Encounter: Payer: Self-pay | Admitting: Family Medicine

## 2018-09-07 ENCOUNTER — Other Ambulatory Visit: Payer: Self-pay | Admitting: Family Medicine

## 2018-09-07 MED ORDER — SERTRALINE HCL 50 MG PO TABS: 50.0000 mg | ORAL_TABLET | Freq: Every day | ORAL | 0 refills | Status: DC

## 2018-09-07 MED ORDER — LEVOTHYROXINE SODIUM 175 MCG PO TABS: 175.0000 ug | ORAL_TABLET | Freq: Every day | ORAL | 0 refills | Status: DC

## 2018-09-07 NOTE — Telephone Encounter (Signed)
Received refill on Zoloft and Levothyroxine - refilled through mail order x 90 days and will send patient a letter that he is overdue for OV and labs.

## 2018-09-25 ENCOUNTER — Other Ambulatory Visit: Payer: Self-pay | Admitting: Family Medicine

## 2018-10-24 DIAGNOSIS — J209 Acute bronchitis, unspecified: Secondary | ICD-10-CM | POA: Diagnosis not present

## 2018-10-31 ENCOUNTER — Ambulatory Visit
Admission: RE | Admit: 2018-10-31 | Discharge: 2018-10-31 | Disposition: A | Discharge status: Home / Self Care | Payer: Medicare Other | Source: Ambulatory Visit | Attending: Family Medicine | Admitting: Family Medicine

## 2018-10-31 ENCOUNTER — Ambulatory Visit (INDEPENDENT_AMBULATORY_CARE_PROVIDER_SITE_OTHER): Payer: Medicare Other | Admitting: Family Medicine

## 2018-10-31 ENCOUNTER — Encounter: Payer: Self-pay | Admitting: Family Medicine

## 2018-10-31 VITALS — BP 120/80 | HR 80 | Temp 98.1°F | Resp 20 | Ht 66.0 in | Wt 208.0 lb

## 2018-10-31 DIAGNOSIS — R0602 Shortness of breath: Secondary | ICD-10-CM | POA: Diagnosis not present

## 2018-10-31 DIAGNOSIS — R6889 Other general symptoms and signs: Secondary | ICD-10-CM | POA: Diagnosis not present

## 2018-10-31 DIAGNOSIS — R5382 Chronic fatigue, unspecified: Secondary | ICD-10-CM

## 2018-10-31 DIAGNOSIS — R0789 Other chest pain: Secondary | ICD-10-CM | POA: Diagnosis not present

## 2018-10-31 DIAGNOSIS — R7303 Prediabetes: Secondary | ICD-10-CM | POA: Diagnosis not present

## 2018-10-31 NOTE — Progress Notes (Signed)
Subjective:    Patient ID: Shaun Gomez, male    DOB: May 21, 1951, 68 y.o.   MRN: [id number]  Medication Refill     Patient has not been seen in over a year.  Approximately 1 week ago, he was diagnosed with bronchitis at a local urgent care.  At that time he had a cough productive of yellow and green sputum and chest congestion.  The cough has improved however he still reports a feeling in his chest that he describes as congestion.  He denies any chest pain.  He denies any chest pressure.  Although he does report some mild shortness of breath with activity.  He also reports extreme fatigue.  However he is very tearful.  A close friend died in December.  Since that time, he states that he has been overcome with anxiety.  He reports worsening depression unrelieved by his Zoloft 50 mg a day.  He reports anhedonia.  He reports perseverating thoughts keeping him awake at night.  He denies any suicidal ideation or homicidal ideation.  So retired from his job in the beginning of December.  This was a large part of his life.  Since this is been taken away, he is finding it difficult to find energy or motivation.  Yesterday he had a wave of anxiety come over him but he denies any panic attacks. Past Medical History:  Diagnosis Date  . CAD (coronary artery disease)   . Depression 07/1993  . Diabetes mellitus without complication (HCC)   . Hyperlipidemia 02/1996  . Hypertension pre 08/25/1996  . OSA on CPAP   . Thyroid disease 07/1993   Hypothyroidism   Past Surgical History:  Procedure Laterality Date  . APPENDECTOMY  2 YOA  . CATARACT EXTRACTION, BILATERAL    . CORONARY ARTERY BYPASS GRAFT  02/1996   X 2 EF 30%  . MI S/P Caverject Injection  02/1996  . RETINAL DETACHMENT SURGERY    . Sleep Study  01/03/2006   Severe sleep apnea - 88 events per hour )2 sats down to 50% ?  Marland Kitchen. Stress Cardiolite  03/1996   EF 30%   Current Outpatient Medications on File Prior to Visit  Medication Sig Dispense  Refill  . Ascorbic Acid (VITAMIN C) 1000 MG tablet Take 1,000 mg by mouth daily.    Marland Kitchen. aspirin 325 MG tablet Take 325 mg by mouth daily.    . cholecalciferol (VITAMIN D) 1000 UNITS tablet Take 1,000 Units by mouth daily.    Marland Kitchen. levothyroxine (SYNTHROID) 175 MCG tablet Take 1 tablet (175 mcg total) by mouth daily before breakfast. 90 tablet 0  . lisinopril (PRINIVIL,ZESTRIL) 20 MG tablet Take 1 tablet (20 mg total) by mouth daily. 90 tablet 3  . Multiple Vitamin (MULTIVITAMIN) tablet Take 1 tablet by mouth daily.    . sertraline (ZOLOFT) 50 MG tablet Take 1 tablet (50 mg total) by mouth daily. 90 tablet 0  . valACYclovir (VALTREX) 1000 MG tablet Take 1 tablet (1,000 mg total) by mouth 3 (three) times daily. 21 tablet 0  . vitamin B-12 (CYANOCOBALAMIN) 1000 MCG tablet Take 1,000 mcg by mouth daily.     No current facility-administered medications on file prior to visit.    Allergies  Allergen Reactions  . Caverject [Alprostadil]     MI after injection   Social History   Socioeconomic History  . Marital status: Married    Spouse name: Not on file  . Number of children: 0  . Years of  education: Not on file  . Highest education level: Not on file  Occupational History  . 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 Financial resource strain: Not on file  . Food insecurity:    Worry: Not on file    Inability: Not on file  . Transportation needs:    Medical: Not on file    Non-medical: Not on file  Tobacco Use  . Smoking status: Former Smoker    Packs/day: 2.00    Years: 15.00    Pack years: 30.00    Types: Cigarettes  . Smokeless tobacco: Never Used  . Tobacco comment: Quit 1983  Substance and Sexual Activity  . Alcohol use: Yes    Comment: case of beer   . Drug use: Not on file  . Sexual activity: Not on file  Lifestyle  . Physical activity:    Days per week: Not on file    Minutes per  session: Not on file  . Stress: Not on file  Relationships  . Social connections:    Talks on phone: Not on file    Gets together: Not on file    Attends religious service: Not on file    Active member of club or organization: Not on file    Attends meetings of clubs or organizations: Not on file    Relationship status: Not on file  . Intimate partner violence:    Fear of current or ex partner: Not on file    Emotionally abused: Not on file    Physically abused: Not on file    Forced sexual activity: Not on file  Other Topics Concern  . Not on file  Social History Narrative   Re-married, lives with wife   2 step-children, 2 granddaughters   Working at [employer] [place], Materials engineerproject developer   Family History  Problem Relation Age of Onset  . Hypertension Mother   . Parkinsonism Mother   . Dementia Mother   . Diabetes Mother   . Stroke Father [id number]  . Depression Sister   . Anxiety disorder Sister   . Heart disease Brother [id number]       CAD  . Hyperlipidemia Brother   . Hypertension Brother   . Arthritis Neg Hx   . Cancer Neg Hx   . Alcohol abuse Neg Hx   . Drug abuse Neg Hx   . Colon cancer Neg Hx   . Prostate cancer Neg Hx   . Heart disease Brother 170       CAD      Review of Systems  All other systems reviewed and are negative.      Objective:   Physical Exam  Constitutional: He is oriented to person, place, and time. He appears well-developed and well-nourished. No distress.  HENT:  Head: Normocephalic and atraumatic.  Right Ear: External ear normal.  Left Ear: External ear normal.  Nose: Nose normal.  Mouth/Throat: Oropharynx is clear and moist. No oropharyngeal exudate.  Eyes: Pupils are equal, round, and reactive to light. Conjunctivae and EOM are normal. No scleral icterus.  Neck: Neck supple. No JVD present. No thyromegaly present.  Cardiovascular: Normal rate, regular rhythm and normal heart sounds.  No murmur heard. Pulmonary/Chest: Effort  normal and breath sounds normal. No respiratory distress. He has no wheezes. He has no rales.  Abdominal: Soft. Bowel sounds are normal. He exhibits no distension. There is  no abdominal tenderness. There is no rebound and no guarding.  Musculoskeletal:        General: No edema.  Lymphadenopathy:    He has no cervical adenopathy.  Neurological: He is alert and oriented to person, place, and time. He has normal reflexes. No cranial nerve deficit. He exhibits normal muscle tone. Coordination normal.  Skin: He is not diaphoretic.  Vitals reviewed.   EKG shows Q waves in lead V1, V2, V3, as well as the inferior leads II and VF.  There are nonspecific ST changes in the lateral leads I, aVL, V5 V6.  These are old changes compared to 2013      Assessment & Plan:  Chest heaviness - Plan: EKG 12-Lead, DG Chest 2 View  Chronic fatigue - Plan: CBC with Differential/Platelet, COMPLETE METABOLIC PANEL WITH GFR, Vitamin B12, TSH, Testosterone Total,Free,Bio, Males  Prediabetes - Plan: Hemoglobin A1c  From an infectious disease standpoint, his bronchitis has apparently resolved.  I will obtain a chest x-ray to ensure that there is no residual chest infection causing the congestion-like feeling in his chest.  I think he needs more time to allow this to clear.  I do not clinically see any evidence of pneumonia.  His symptoms are not classic for coronary disease.  He denies any angina.  He denies any chest pain or chest pressure.  I feel the majority of his symptoms including fatigue could be secondary to anxiety and depression.  Patient agrees.  Therefore will increase his Zoloft to 100 mg a day.  Meanwhile given his history of noncompliance, I will check an A1c.  I will also check a TSH, testosterone level, vitamin B12, CBC, and CMP to evaluate for any underlying metabolic or laboratory abnormalities that could explain his fatigue.  I believe the fatigue is most likely secondary to depression.

## 2018-11-03 LAB — COMPLETE METABOLIC PANEL WITH GFR
AG Ratio: 1.2 (calc) (ref 1.0–2.5)
ALT: 60 U/L — ABNORMAL HIGH (ref 9–46)
AST: 43 U/L — ABNORMAL HIGH (ref 10–35)
Albumin: 4.1 g/dL (ref 3.6–5.1)
Alkaline phosphatase (APISO): 95 U/L (ref 40–115)
BUN: 19 mg/dL (ref 7–25)
CO2: 19 mmol/L — ABNORMAL LOW (ref 20–32)
Calcium: 9.7 mg/dL (ref 8.6–10.3)
Chloride: 100 mmol/L (ref 98–110)
Creat: 0.96 mg/dL (ref 0.70–1.25)
GFR, Est African American: 94 mL/min/{1.73_m2} (ref >=60)
GFR, Est Non African American: 81 mL/min/{1.73_m2} (ref >=60)
Globulin: 3.5 g/dL (calc) (ref 1.9–3.7)
Glucose, Bld: 126 mg/dL — ABNORMAL HIGH (ref 65–99)
Potassium: 5 mmol/L (ref 3.5–5.3)
Sodium: 135 mmol/L (ref 135–146)
Total Bilirubin: 0.3 mg/dL (ref 0.2–1.2)
Total Protein: 7.6 g/dL (ref 6.1–8.1)

## 2018-11-03 LAB — CBC WITH DIFFERENTIAL/PLATELET
Absolute Monocytes: 1474 cells/uL — ABNORMAL HIGH (ref 200–950)
Basophils Absolute: 49 cells/uL (ref 0–200)
Basophils Relative: 0.5 %
Eosinophils Absolute: 194 cells/uL (ref 15–500)
Eosinophils Relative: 2 %
HCT: 45.9 % (ref 38.5–50.0)
Hemoglobin: 15.8 g/dL (ref 13.2–17.1)
Lymphs Abs: 2726 cells/uL (ref 850–3900)
MCH: 34.1 pg — ABNORMAL HIGH (ref 27.0–33.0)
MCHC: 34.4 g/dL (ref 32.0–36.0)
MCV: 98.9 fL (ref 80.0–100.0)
MPV: 10.4 fL (ref 7.5–12.5)
Monocytes Relative: 15.2 %
Neutro Abs: 5257 cells/uL (ref 1500–7800)
Neutrophils Relative %: 54.2 %
Platelets: 310 10*3/uL (ref 140–400)
RBC: 4.64 10*6/uL (ref 4.20–5.80)
RDW: 12 % (ref 11.0–15.0)
Total Lymphocyte: 28.1 %
WBC: 9.7 10*3/uL (ref 3.8–10.8)

## 2018-11-03 LAB — HEMOGLOBIN A1C
Hgb A1c MFr Bld: 6.6 % of total Hgb — ABNORMAL HIGH (ref <5.7)
Mean Plasma Glucose: 143 (calc)
eAG (mmol/L): 7.9 (calc)

## 2018-11-03 LAB — TESTOSTERONE TOTAL,FREE,BIO, MALES
Albumin: 4.1 g/dL (ref 3.6–5.1)
Sex Hormone Binding: 43 nmol/L (ref 22–77)
Testosterone: 202 ng/dL — ABNORMAL LOW (ref 250–827)

## 2018-11-03 LAB — VITAMIN B12: Vitamin B-12: 854 pg/mL (ref 200–1100)

## 2018-11-03 LAB — TSH: TSH: 1.82 mIU/L (ref 0.40–4.50)

## 2018-11-18 ENCOUNTER — Encounter: Payer: Self-pay | Admitting: Family Medicine

## 2018-11-18 ENCOUNTER — Ambulatory Visit (INDEPENDENT_AMBULATORY_CARE_PROVIDER_SITE_OTHER): Payer: Medicare Other | Admitting: Family Medicine

## 2018-11-18 VITALS — BP 122/60 | HR 96 | Temp 98.3°F | Resp 16 | Ht 66.0 in | Wt 211.0 lb

## 2018-11-18 DIAGNOSIS — F339 Major depressive disorder, recurrent, unspecified: Secondary | ICD-10-CM

## 2018-11-18 DIAGNOSIS — R5382 Chronic fatigue, unspecified: Secondary | ICD-10-CM

## 2018-11-18 MED ORDER — VENLAFAXINE HCL ER 75 MG PO CP24: 150.0000 mg | ORAL_CAPSULE | Freq: Every day | ORAL | 1 refills | Status: DC

## 2018-11-18 NOTE — Progress Notes (Signed)
Subjective:    Patient ID: Shaun Gomez, male    DOB: 30-Sep-1951, 68 y.o.   MRN: [id number]  Medication Refill    10/31/18 Patient has not been seen in over a year.  Approximately 1 week ago, he was diagnosed with bronchitis at a local urgent care.  At that time he had a cough productive of yellow and green sputum and chest congestion.  The cough has improved however he still reports a feeling in his chest that he describes as congestion.  He denies any chest pain.  He denies any chest pressure.  Although he does report some mild shortness of breath with activity.  He also reports extreme fatigue.  However he is very tearful.  A close friend died in December.  Since that time, he states that he has been overcome with anxiety.  He reports worsening depression unrelieved by his Zoloft 50 mg a day.  He reports anhedonia.  He reports perseverating thoughts keeping him awake at night.  He denies any suicidal ideation or homicidal ideation.  So retired from his job in the beginning of December.  This was a large part of his life.  Since this is been taken away, he is finding it difficult to find energy or motivation.  Yesterday he had a wave of anxiety come over him but he denies any panic attacks.  At that time, my plan was: From an infectious disease standpoint, his bronchitis has apparently resolved.  I will obtain a chest x-ray to ensure that there is no residual chest infection causing the congestion-like feeling in his chest.  I think he needs more time to allow this to clear.  I do not clinically see any evidence of pneumonia.  His symptoms are not classic for coronary disease.  He denies any angina.  He denies any chest pain or chest pressure.  I feel the majority of his symptoms including fatigue could be secondary to anxiety and depression.  Patient agrees.  Therefore will increase his Zoloft to 100 mg a day.  Meanwhile given his history of noncompliance, I will check an A1c.  I will also check a TSH,  testosterone level, vitamin B12, CBC, and CMP to evaluate for any underlying metabolic or laboratory abnormalities that could explain his fatigue.  I believe the fatigue is most likely secondary to depression.  11/18/18 Office Visit on 10/31/2018  Component Date Value Ref Range Status  . WBC 10/31/2018 9.7  3.8 - 10.8 Thousand/uL Final  . RBC 10/31/2018 4.64  4.20 - 5.80 Million/uL Final  . Hemoglobin 10/31/2018 15.8  13.2 - 17.1 g/dL Final  . HCT 40/98/119101/03/2019 45.9  38.5 - 50.0 % Final  . MCV 10/31/2018 98.9  80.0 - 100.0 fL Final  . MCH 10/31/2018 34.1* 27.0 - 33.0 pg Final  . MCHC 10/31/2018 34.4  32.0 - 36.0 g/dL Final  . RDW 47/82/956201/03/2019 12.0  11.0 - 15.0 % Final  . Platelets 10/31/2018 310  140 - 400 Thousand/uL Final  . MPV 10/31/2018 10.4  7.5 - 12.5 fL Final  . Neutro Abs 10/31/2018 5,257  1,500 - 7,800 cells/uL Final  . Lymphs Abs 10/31/2018 2,726  850 - 3,900 cells/uL Final  . Absolute Monocytes 10/31/2018 1,474* 200 - 950 cells/uL Final  . Eosinophils Absolute 10/31/2018 194  15 - 500 cells/uL Final  . Basophils Absolute 10/31/2018 49  0 - 200 cells/uL Final  . Neutrophils Relative % 10/31/2018 54.2  % Final  . Total Lymphocyte 10/31/2018 28.1  %  Final  . Monocytes Relative 10/31/2018 15.2  % Final  . Eosinophils Relative 10/31/2018 2.0  % Final  . Basophils Relative 10/31/2018 0.5  % Final  . Glucose, Bld 10/31/2018 126* 65 - 99 mg/dL Final   Comment: .            Fasting reference interval . For someone without known diabetes, a glucose value >125 mg/dL indicates that they may have diabetes and this should be confirmed with a follow-up test. .   . BUN 10/31/2018 19  7 - 25 mg/dL Final  . Creat 52/77/8242 0.96  0.70 - 1.25 mg/dL Final   Comment: For patients >27 years of age, the reference limit for Creatinine is approximately 13% higher for people identified as African-American. .   . GFR, Est Non African American 10/31/2018 81  > OR = 60 mL/min/1.62m2 Final  . GFR,  Est African American 10/31/2018 94  > OR = 60 mL/min/1.38m2 Final  . BUN/Creatinine Ratio 10/31/2018 NOT APPLICABLE  6 - 22 (calc) Final  . Sodium 10/31/2018 135  135 - 146 mmol/L Final  . Potassium 10/31/2018 5.0  3.5 - 5.3 mmol/L Final  . Chloride 10/31/2018 100  98 - 110 mmol/L Final  . CO2 10/31/2018 19* 20 - 32 mmol/L Final  . Calcium 10/31/2018 9.7  8.6 - 10.3 mg/dL Final  . Total Protein 10/31/2018 7.6  6.1 - 8.1 g/dL Final  . Albumin 35/36/1443 4.1  3.6 - 5.1 g/dL Final  . Globulin 15/40/0867 3.5  1.9 - 3.7 g/dL (calc) Final  . AG Ratio 10/31/2018 1.2  1.0 - 2.5 (calc) Final  . Total Bilirubin 10/31/2018 0.3  0.2 - 1.2 mg/dL Final  . Alkaline phosphatase (APISO) 10/31/2018 95  40 - 115 U/L Final  . AST 10/31/2018 43* 10 - 35 U/L Final  . ALT 10/31/2018 60* 9 - 46 U/L Final  . Vitamin B-12 10/31/2018 854  200 - 1,100 pg/mL Final  . TSH 10/31/2018 1.82  0.40 - 4.50 mIU/L Final  . Testosterone 10/31/2018 202* 250 - 827 ng/dL Final  . Albumin 61/95/0932 4.1  3.6 - 5.1 g/dL Final  . Sex Hormone Binding 10/31/2018 43  22 - 77 nmol/L Final  . Testosterone, Free 10/31/2018 See below  46.0 - 224.0 pg/mL Final  . Testosterone, Bioavailable 10/31/2018   110.0 - 575 ng/dL Final   Comment: Due to the diminished accuracy of immunoassay at levels below 250 ng/dL, calculations of the Free and Bioavailable Testosterone are not accurate. If needed, Testosterone, Free, Bio and Total, LC/MS/MS (test code 67124) is the recommended assay. This specimen  must be collected in a red-top tube with no gel. .   . Hgb A1c MFr Bld 10/31/2018 6.6* <5.7 % of total Hgb Final   Comment: For someone without known diabetes, a hemoglobin A1c value of 6.5% or greater indicates that they may have  diabetes and this should be confirmed with a follow-up  test. . For someone with known diabetes, a value <7% indicates  that their diabetes is well controlled and a value  greater than or equal to 7% indicates  suboptimal  control. A1c targets should be individualized based on  duration of diabetes, age, comorbid conditions, and  other considerations. . Currently, no consensus exists regarding use of hemoglobin A1c for diagnosis of diabetes for children. .   . Mean Plasma Glucose 10/31/2018 143  (calc) Final  . eAG (mmol/L) 10/31/2018 7.9  (calc) Final   Patient was found to  have mild hypogonadism and mild elevations in his liver function test although he has an ultrasound from 2017 that showed fatty liver disease.  He try to increase the Zoloft to 100 mg a day however he felt "overmedicated and therefore decreased to 75 mg a day".  He has not seen any improvement in his energy level.  He still feels like he has "lost his zest for living".  He denies any suicidal ideation or hallucinations however he does report anhedonia, sadness, low energy, fatigue, poor libido, and erectile problems.  Past Medical History:  Diagnosis Date  . CAD (coronary artery disease)   . Depression 07/1993  . Diabetes mellitus without complication (HCC)   . Hyperlipidemia 02/1996  . Hypertension pre 08/25/1996  . OSA on CPAP   . Thyroid disease 07/1993   Hypothyroidism   Past Surgical History:  Procedure Laterality Date  . APPENDECTOMY  2 YOA  . CATARACT EXTRACTION, BILATERAL    . CORONARY ARTERY BYPASS GRAFT  02/1996   X 2 EF 30%  . MI S/P Caverject Injection  02/1996  . RETINAL DETACHMENT SURGERY    . Sleep Study  01/03/2006   Severe sleep apnea - 88 events per hour )2 sats down to 50% ?  Marland Kitchen Stress Cardiolite  03/1996   EF 30%   Current Outpatient Medications on File Prior to Visit  Medication Sig Dispense Refill  . Ascorbic Acid (VITAMIN C) 1000 MG tablet Take 1,000 mg by mouth daily.    Marland Kitchen aspirin 325 MG tablet Take 325 mg by mouth daily.    . cholecalciferol (VITAMIN D) 1000 UNITS tablet Take 1,000 Units by mouth daily.    Marland Kitchen levothyroxine (SYNTHROID) 175 MCG tablet Take 1 tablet (175 mcg total) by mouth  daily before breakfast. 90 tablet 0  . lisinopril (PRINIVIL,ZESTRIL) 20 MG tablet Take 1 tablet (20 mg total) by mouth daily. 90 tablet 3  . Multiple Vitamin (MULTIVITAMIN) tablet Take 1 tablet by mouth daily.    . sertraline (ZOLOFT) 50 MG tablet Take 1 tablet (50 mg total) by mouth daily. 90 tablet 0  . vitamin B-12 (CYANOCOBALAMIN) 1000 MCG tablet Take 1,000 mcg by mouth daily.     No current facility-administered medications on file prior to visit.    Allergies  Allergen Reactions  . Caverject [Alprostadil]     MI after injection   Social History   Socioeconomic History  . Marital status: Married    Spouse name: Not on file  . Number of children: 0  . Years of education: Not on file  . Highest education level: Not on file  Occupational History  . 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 Financial resource strain: Not on file  . Food insecurity:    Worry: Not on file    Inability: Not on file  . Transportation needs:    Medical: Not on file    Non-medical: Not on file  Tobacco Use  . Smoking status: Former Smoker    Packs/day: 2.00    Years: 15.00    Pack years: 30.00    Types: Cigarettes  . Smokeless tobacco: Never Used  . Tobacco comment: Quit 1983  Substance and Sexual Activity  . Alcohol use: Yes    Comment: case of beer   . Drug use: Not on file  . Sexual activity: Not on file  Lifestyle  . Physical activity:  Days per week: Not on file    Minutes per session: Not on file  . Stress: Not on file  Relationships  . Social connections:    Talks on phone: Not on file    Gets together: Not on file    Attends religious service: Not on file    Active member of club or organization: Not on file    Attends meetings of clubs or organizations: Not on file    Relationship status: Not on file  . Intimate partner violence:    Fear of current or ex partner: Not on file     Emotionally abused: Not on file    Physically abused: Not on file    Forced sexual activity: Not on file  Other Topics Concern  . Not on file  Social History Narrative   Re-married, lives with wife   2 step-children, 2 granddaughters   Working at [employer] [place], Materials engineerproject developer   Family History  Problem Relation Age of Onset  . Hypertension Mother   . Parkinsonism Mother   . Dementia Mother   . Diabetes Mother   . Stroke Father 371  . Depression Sister   . Anxiety disorder Sister   . Heart disease Brother [id number]       CAD  . Hyperlipidemia Brother   . Hypertension Brother   . Arthritis Neg Hx   . Cancer Neg Hx   . Alcohol abuse Neg Hx   . Drug abuse Neg Hx   . Colon cancer Neg Hx   . Prostate cancer Neg Hx   . Heart disease Brother 2670       CAD      Review of Systems  All other systems reviewed and are negative.      Objective:   Physical Exam  Constitutional: He is oriented to person, place, and time. He appears well-developed and well-nourished. No distress.  HENT:  Head: Normocephalic and atraumatic.  Right Ear: External ear normal.  Left Ear: External ear normal.  Nose: Nose normal.  Mouth/Throat: Oropharynx is clear and moist. No oropharyngeal exudate.  Eyes: Pupils are equal, round, and reactive to light. Conjunctivae and EOM are normal. No scleral icterus.  Neck: Neck supple. No JVD present. No thyromegaly present.  Cardiovascular: Normal rate, regular rhythm and normal heart sounds.  No murmur heard. Pulmonary/Chest: Effort normal and breath sounds normal. No respiratory distress. He has no wheezes. He has no rales.  Abdominal: Soft. Bowel sounds are normal. He exhibits no distension. There is no abdominal tenderness. There is no rebound and no guarding.  Musculoskeletal:        General: No edema.  Lymphadenopathy:    He has no cervical adenopathy.  Neurological: He is alert and oriented to person, place, and time. He has normal  reflexes. No cranial nerve deficit. He exhibits normal muscle tone. Coordination normal.  Skin: He is not diaphoretic.  Vitals reviewed.        Assessment & Plan:  I still believe the majority of his symptoms are due to depression.  I recommended decreasing Zoloft to 25 mg every day for 2 weeks and then 25 mg every other day for 2 weeks and then 25 mg every third day for 2 weeks then discontinue the Zoloft.  Meanwhile I recommended starting Effexor 75 mg p.o. every morning for 2 weeks and then increasing to 150 mg p.o. every morning thereafter and then recheck here in 6 weeks to see how the patient  is doing or sooner if getting worse

## 2018-11-25 ENCOUNTER — Other Ambulatory Visit: Payer: Self-pay | Admitting: Family Medicine

## 2018-12-11 ENCOUNTER — Other Ambulatory Visit: Payer: Self-pay | Admitting: Family Medicine

## 2018-12-28 ENCOUNTER — Other Ambulatory Visit: Payer: Self-pay | Admitting: Family Medicine

## 2019-01-04 DIAGNOSIS — H35371 Puckering of macula, right eye: Secondary | ICD-10-CM | POA: Diagnosis not present

## 2019-01-04 DIAGNOSIS — H40013 Open angle with borderline findings, low risk, bilateral: Secondary | ICD-10-CM | POA: Diagnosis not present

## 2019-01-04 DIAGNOSIS — H52203 Unspecified astigmatism, bilateral: Secondary | ICD-10-CM | POA: Diagnosis not present

## 2019-01-04 DIAGNOSIS — H43812 Vitreous degeneration, left eye: Secondary | ICD-10-CM | POA: Diagnosis not present

## 2019-02-09 ENCOUNTER — Other Ambulatory Visit: Payer: Self-pay | Admitting: Family Medicine

## 2019-02-09 MED ORDER — LISINOPRIL 20 MG PO TABS: 20.0000 mg | ORAL_TABLET | Freq: Every day | ORAL | 3 refills | Status: DC

## 2019-04-24 DIAGNOSIS — H1820 Unspecified corneal edema: Secondary | ICD-10-CM | POA: Diagnosis not present

## 2019-04-24 DIAGNOSIS — H40023 Open angle with borderline findings, high risk, bilateral: Secondary | ICD-10-CM | POA: Diagnosis not present

## 2019-04-24 DIAGNOSIS — H04123 Dry eye syndrome of bilateral lacrimal glands: Secondary | ICD-10-CM | POA: Diagnosis not present

## 2019-04-26 DIAGNOSIS — H1820 Unspecified corneal edema: Secondary | ICD-10-CM | POA: Diagnosis not present

## 2019-04-26 DIAGNOSIS — H409 Unspecified glaucoma: Secondary | ICD-10-CM | POA: Diagnosis not present

## 2019-04-26 DIAGNOSIS — Z8669 Personal history of other diseases of the nervous system and sense organs: Secondary | ICD-10-CM | POA: Diagnosis not present

## 2019-04-26 DIAGNOSIS — Z961 Presence of intraocular lens: Secondary | ICD-10-CM | POA: Diagnosis not present

## 2019-04-26 DIAGNOSIS — Z9889 Other specified postprocedural states: Secondary | ICD-10-CM | POA: Diagnosis not present

## 2019-05-24 DIAGNOSIS — Z9889 Other specified postprocedural states: Secondary | ICD-10-CM | POA: Diagnosis not present

## 2019-05-24 DIAGNOSIS — Z8669 Personal history of other diseases of the nervous system and sense organs: Secondary | ICD-10-CM | POA: Diagnosis not present

## 2019-05-24 DIAGNOSIS — H409 Unspecified glaucoma: Secondary | ICD-10-CM | POA: Diagnosis not present

## 2019-05-24 DIAGNOSIS — H1820 Unspecified corneal edema: Secondary | ICD-10-CM | POA: Diagnosis not present

## 2019-05-24 DIAGNOSIS — Z961 Presence of intraocular lens: Secondary | ICD-10-CM | POA: Diagnosis not present

## 2019-06-14 ENCOUNTER — Other Ambulatory Visit: Payer: Self-pay | Admitting: Family Medicine

## 2019-08-29 ENCOUNTER — Other Ambulatory Visit: Payer: Self-pay | Admitting: Family Medicine

## 2019-09-06 DIAGNOSIS — H1820 Unspecified corneal edema: Secondary | ICD-10-CM | POA: Diagnosis not present

## 2019-11-03 ENCOUNTER — Ambulatory Visit (INDEPENDENT_AMBULATORY_CARE_PROVIDER_SITE_OTHER): Payer: Medicare Other | Admitting: Family Medicine

## 2019-11-03 ENCOUNTER — Other Ambulatory Visit: Payer: Self-pay

## 2019-11-03 ENCOUNTER — Encounter: Payer: Self-pay | Admitting: Family Medicine

## 2019-11-03 VITALS — BP 134/78 | HR 92 | Temp 96.5°F | Resp 18 | Ht 66.0 in | Wt 190.0 lb

## 2019-11-03 DIAGNOSIS — E039 Hypothyroidism, unspecified: Secondary | ICD-10-CM | POA: Diagnosis not present

## 2019-11-03 DIAGNOSIS — E119 Type 2 diabetes mellitus without complications: Secondary | ICD-10-CM | POA: Diagnosis not present

## 2019-11-03 DIAGNOSIS — F339 Major depressive disorder, recurrent, unspecified: Secondary | ICD-10-CM

## 2019-11-03 MED ORDER — VENLAFAXINE HCL ER 37.5 MG PO CP24: 37.5000 mg | ORAL_CAPSULE | Freq: Every day | ORAL | 0 refills | Status: DC

## 2019-11-03 MED ORDER — SERTRALINE HCL 50 MG PO TABS: 50.0000 mg | ORAL_TABLET | Freq: Every day | ORAL | 3 refills | Status: DC

## 2019-11-03 NOTE — Progress Notes (Signed)
Subjective:    Patient ID: Shaun Gomez, male    DOB: 08-15-1951, 69 y.o.   MRN: [id number]  Medication Refill   10/31/18 Patient has not been seen in over a year.  Approximately 1 week ago, he was diagnosed with bronchitis at a local urgent care.  At that time he had a cough productive of yellow and green sputum and chest congestion.  The cough has improved however he still reports a feeling in his chest that he describes as congestion.  He denies any chest pain.  He denies any chest pressure.  Although he does report some mild shortness of breath with activity.  He also reports extreme fatigue.  However he is very tearful.  A close friend died in 10/16/2023.  Since that time, he states that he has been overcome with anxiety.  He reports worsening depression unrelieved by his Zoloft 50 mg a day.  He reports anhedonia.  He reports perseverating thoughts keeping him awake at night.  He denies any suicidal ideation or homicidal ideation.  So retired from his job in the beginning of 10/16/23.  This was a large part of his life.  Since this is been taken away, he is finding it difficult to find energy or motivation.  Yesterday he had a wave of anxiety come over him but he denies any panic attacks.  At that time, my plan was: From an infectious disease standpoint, his bronchitis has apparently resolved.  I will obtain a chest x-ray to ensure that there is no residual chest infection causing the congestion-like feeling in his chest.  I think he needs more time to allow this to clear.  I do not clinically see any evidence of pneumonia.  His symptoms are not classic for coronary disease.  He denies any angina.  He denies any chest pain or chest pressure.  I feel the majority of his symptoms including fatigue could be secondary to anxiety and depression.  Patient agrees.  Therefore will increase his Zoloft to 100 mg a day.  Meanwhile given his history of noncompliance, I will check an A1c.  I will also check a TSH,  testosterone level, vitamin B12, CBC, and CMP to evaluate for any underlying metabolic or laboratory abnormalities that could explain his fatigue.  I believe the fatigue is most likely secondary to depression.  11/18/18 No visits with results within 1 Month(s) from this visit.  Latest known visit with results is:  Office Visit on 10/31/2018  Component Date Value Ref Range Status  . WBC 10/31/2018 9.7  3.8 - 10.8 Thousand/uL Final  . RBC 10/31/2018 4.64  4.20 - 5.80 Million/uL Final  . Hemoglobin 10/31/2018 15.8  13.2 - 17.1 g/dL Final  . HCT 10/31/2018 45.9  38.5 - 50.0 % Final  . MCV 10/31/2018 98.9  80.0 - 100.0 fL Final  . MCH 10/31/2018 34.1* 27.0 - 33.0 pg Final  . MCHC 10/31/2018 34.4  32.0 - 36.0 g/dL Final  . RDW 10/31/2018 12.0  11.0 - 15.0 % Final  . Platelets 10/31/2018 310  140 - 400 Thousand/uL Final  . MPV 10/31/2018 10.4  7.5 - 12.5 fL Final  . Neutro Abs 10/31/2018 5,257  1,500 - 7,800 cells/uL Final  . Lymphs Abs 10/31/2018 2,726  850 - 3,900 cells/uL Final  . Absolute Monocytes 10/31/2018 1,474* 200 - 950 cells/uL Final  . Eosinophils Absolute 10/31/2018 194  15 - 500 cells/uL Final  . Basophils Absolute 10/31/2018 49  0 - 200 cells/uL  Final  . Neutrophils Relative % 10/31/2018 54.2  % Final  . Total Lymphocyte 10/31/2018 28.1  % Final  . Monocytes Relative 10/31/2018 15.2  % Final  . Eosinophils Relative 10/31/2018 2.0  % Final  . Basophils Relative 10/31/2018 0.5  % Final  . Glucose, Bld 10/31/2018 126* 65 - 99 mg/dL Final   Comment: .            Fasting reference interval . For someone without known diabetes, a glucose value >125 mg/dL indicates that they may have diabetes and this should be confirmed with a follow-up test. .   . BUN 10/31/2018 19  7 - 25 mg/dL Final  . Creat 16/10/960401/03/2019 0.96  0.70 - 1.25 mg/dL Final   Comment: For patients >70 years of age, the reference limit for Creatinine is approximately 13% higher for people identified as  African-American. .   . GFR, Est Non African American 10/31/2018 81  > OR = 60 mL/min/1.5173m2 Final  . GFR, Est African American 10/31/2018 94  > OR = 60 mL/min/1.5573m2 Final  . BUN/Creatinine Ratio 10/31/2018 NOT APPLICABLE  6 - 22 (calc) Final  . Sodium 10/31/2018 135  135 - 146 mmol/L Final  . Potassium 10/31/2018 5.0  3.5 - 5.3 mmol/L Final  . Chloride 10/31/2018 100  98 - 110 mmol/L Final  . CO2 10/31/2018 19* 20 - 32 mmol/L Final  . Calcium 10/31/2018 9.7  8.6 - 10.3 mg/dL Final  . Total Protein 10/31/2018 7.6  6.1 - 8.1 g/dL Final  . Albumin 54/09/811901/03/2019 4.1  3.6 - 5.1 g/dL Final  . Globulin 14/78/295601/03/2019 3.5  1.9 - 3.7 g/dL (calc) Final  . AG Ratio 10/31/2018 1.2  1.0 - 2.5 (calc) Final  . Total Bilirubin 10/31/2018 0.3  0.2 - 1.2 mg/dL Final  . Alkaline phosphatase (APISO) 10/31/2018 95  40 - 115 U/L Final  . AST 10/31/2018 43* 10 - 35 U/L Final  . ALT 10/31/2018 60* 9 - 46 U/L Final  . Vitamin B-12 10/31/2018 854  200 - 1,100 pg/mL Final  . TSH 10/31/2018 1.82  0.40 - 4.50 mIU/L Final  . Testosterone 10/31/2018 202* 250 - 827 ng/dL Final  . Albumin 21/30/865701/03/2019 4.1  3.6 - 5.1 g/dL Final  . Sex Hormone Binding 10/31/2018 43  22 - 77 nmol/L Final  . Testosterone, Free 10/31/2018 See below  46.0 - 224.0 pg/mL Final  . Testosterone, Bioavailable 10/31/2018   110.0 - 575 ng/dL Final   Comment: Due to the diminished accuracy of immunoassay at levels below 250 ng/dL, calculations of the Free and Bioavailable Testosterone are not accurate. If needed, Testosterone, Free, Bio and Total, LC/MS/MS (test code [id number]) is the recommended assay. This specimen  must be collected in a red-top tube with no gel. .   . Hgb A1c MFr Bld 10/31/2018 6.6* <5.7 % of total Hgb Final   Comment: For someone without known diabetes, a hemoglobin A1c value of 6.5% or greater indicates that they may have  diabetes and this should be confirmed with a follow-up  test. . For someone with known diabetes, a value  <7% indicates  that their diabetes is well controlled and a value  greater than or equal to 7% indicates suboptimal  control. A1c targets should be individualized based on  duration of diabetes, age, comorbid conditions, and  other considerations. . Currently, no consensus exists regarding use of hemoglobin A1c for diagnosis of diabetes for children. .   . Mean Plasma Glucose 10/31/2018  143  (calc) Final  . eAG (mmol/L) 10/31/2018 7.9  (calc) Final   Patient was found to have mild hypogonadism and mild elevations in his liver function test although he has an ultrasound from 2017 that showed fatty liver disease.  He try to increase the Zoloft to 100 mg a day however he felt "overmedicated and therefore decreased to 75 mg a day".  He has not seen any improvement in his energy level.  He still feels like he has "lost his zest for living".  He denies any suicidal ideation or hallucinations however he does report anhedonia, sadness, low energy, fatigue, poor libido, and erectile problems.  At that time, my plan was: still believe the majority of his symptoms are due to depression.  I recommended decreasing Zoloft to 25 mg every day for 2 weeks and then 25 mg every other day for 2 weeks and then 25 mg every third day for 2 weeks then discontinue the Zoloft.  Meanwhile I recommended starting Effexor 75 mg p.o. every morning for 2 weeks and then increasing to 150 mg p.o. every morning thereafter and then recheck here in 6 weeks to see how the patient is doing or sooner if getting worse  11/03/19 Patient has not been seen since.  He feels that his depression is worse.  He states that he has no energy.  He has a hard time leaving the bed in the morning due to lack of motivation to face the day.  He denies any joy in life.  He feels like he is just existing.  He does not find pleasure or excitement in anything that he does.  He denies any suicidal ideation.  He does have lack of motivation and lack of appetite.   He has lost 12 pounds.  He states that he just does not feel like eating sometimes.  Unfortunately he also reports polyuria.  He is also having drank a lot of water because he is always thirsty.  He has not been checking his blood sugar.  He denies any blurry vision that is new.  He does have a history of type 2 diabetes mellitus although last time we checked his hemoglobin A1c was 6.6 in January of last year.  His TSH has not been checked in quite some time either.  Blood pressure today is well controlled at 134/78.  He denies any chest pain shortness of breath or dyspnea on exertion.  He denies any nausea or vomiting or diarrhea.  He denies any melena or hematochezia.  Past Medical History:  Diagnosis Date  . CAD (coronary artery disease)   . Depression 07/1993  . Diabetes mellitus without complication (HCC)   . Hyperlipidemia 02/1996  . Hypertension pre 08/25/1996  . OSA on CPAP   . Thyroid disease 07/1993   Hypothyroidism   Past Surgical History:  Procedure Laterality Date  . APPENDECTOMY  2 YOA  . CATARACT EXTRACTION, BILATERAL    . CORONARY ARTERY BYPASS GRAFT  02/1996   X 2 EF 30%  . MI S/P Caverject Injection  02/1996  . RETINAL DETACHMENT SURGERY    . Sleep Study  01/03/2006   Severe sleep apnea - 88 events per hour )2 sats down to 50% ?  Marland Kitchen Stress Cardiolite  03/1996   EF 30%   Current Outpatient Medications on File Prior to Visit  Medication Sig Dispense Refill  . Ascorbic Acid (VITAMIN C) 1000 MG tablet Take 1,000 mg by mouth daily.    Marland Kitchen aspirin  325 MG tablet Take 325 mg by mouth daily.    . cholecalciferol (VITAMIN D) 1000 UNITS tablet Take 1,000 Units by mouth daily.    Marland Kitchen latanoprost (XALATAN) 0.005 % ophthalmic solution Place 1 drop into both eyes at bedtime.     Marland Kitchen levothyroxine (SYNTHROID) 175 MCG tablet TAKE 1 TABLET DAILY BEFORE BREAKFAST. 90 tablet 2  . lisinopril (PRINIVIL,ZESTRIL) 20 MG tablet Take 1 tablet (20 mg total) by mouth daily. 90 tablet 3  . Multiple  Vitamin (MULTIVITAMIN) tablet Take 1 tablet by mouth daily.    . vitamin B-12 (CYANOCOBALAMIN) 1000 MCG tablet Take 1,000 mcg by mouth daily.     No current facility-administered medications on file prior to visit.   Allergies  Allergen Reactions  . Caverject [Alprostadil]     MI after injection   Social History   Socioeconomic History  . Marital status: Married    Spouse name: Not on file  . Number of children: 0  . Years of education: Not on file  . Highest education level: Not on file  Occupational History  . 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 Smoking status: Former Smoker    Packs/day: 2.00    Years: 15.00    Pack years: 30.00    Types: Cigarettes  . Smokeless tobacco: Never Used  . Tobacco comment: Quit 1983  Substance and Sexual Activity  . Alcohol use: Yes    Comment: case of beer   . Drug use: Not on file  . Sexual activity: Not on file  Other Topics Concern  . Not on file  Social History Narrative   Re-married, lives with wife   2 step-children, 2 granddaughters   Working at [employer] in [employer], Materials engineer   Social Determinants of Health   Financial Resource Strain:   . Difficulty of Paying Living Expenses: Not on file  Food Insecurity:   . Worried About Programme researcher, broadcasting/film/video in the Last Year: Not on file  . Ran Out of Food in the Last Year: Not on file  Transportation Needs:   . Lack of Transportation (Medical): Not on file  . Lack of Transportation (Non-Medical): Not on file  Physical Activity:   . Days of Exercise per Week: Not on file  . Minutes of Exercise per Session: Not on file  Stress:   . Feeling of Stress : Not on file  Social Connections:   . Frequency of Communication with Friends and Family: Not on file  . Frequency of Social Gatherings with Friends and Family: Not on file  . Attends Religious Services: Not on file  . Active  Member of Clubs or Organizations: Not on file  . Attends Banker Meetings: Not on file  . Marital Status: Not on file  Intimate Partner Violence:   . Fear of Current or Ex-Partner: Not on file  . Emotionally Abused: Not on file  . Physically Abused: Not on file  . Sexually Abused: Not on file   Family History  Problem Relation Age of Onset  . Hypertension Mother   . Parkinsonism Mother   . Dementia Mother   . Diabetes Mother   . Stroke Father 20  . Depression Sister   . Anxiety disorder Sister   . Heart disease Brother 95       CAD  . Hyperlipidemia Brother   . Hypertension Brother   .  Arthritis Neg Hx   . Cancer Neg Hx   . Alcohol abuse Neg Hx   . Drug abuse Neg Hx   . Colon cancer Neg Hx   . Prostate cancer Neg Hx   . Heart disease Brother 47       CAD      Review of Systems  All other systems reviewed and are negative.      Objective:   Physical Exam  Constitutional: He is oriented to person, place, and time. He appears well-developed and well-nourished. No distress.  HENT:  Head: Normocephalic and atraumatic.  Right Ear: External ear normal.  Left Ear: External ear normal.  Nose: Nose normal.  Mouth/Throat: Oropharynx is clear and moist. No oropharyngeal exudate.  Eyes: Pupils are equal, round, and reactive to light. Conjunctivae and EOM are normal. No scleral icterus.  Neck: No JVD present. No thyromegaly present.  Cardiovascular: Normal rate, regular rhythm and normal heart sounds.  No murmur heard. Pulmonary/Chest: Effort normal and breath sounds normal. No respiratory distress. He has no wheezes. He has no rales.  Abdominal: Soft. Bowel sounds are normal. He exhibits no distension. There is no abdominal tenderness. There is no rebound and no guarding.  Musculoskeletal:        General: No edema.     Cervical back: Neck supple.  Lymphadenopathy:    He has no cervical adenopathy.  Neurological: He is alert and oriented to person, place, and  time. He has normal reflexes. No cranial nerve deficit. He exhibits normal muscle tone. Coordination normal.  Skin: He is not diaphoretic.  Vitals reviewed.        Assessment & Plan:  Type 2 diabetes mellitus without complication, without long-term current use of insulin (HCC) - Plan: Hemoglobin A1c, CBC with Differential, COMPLETE METABOLIC PANEL WITH GFR  Hypothyroidism, unspecified type - Plan: TSH  Depression, recurrent (HCC)  Obtain baseline labs to evaluate the patient's underlying medical conditions.  Given his weight loss am concerned about uncontrolled diabetes.  Therefore I will check a hemoglobin A1c along with a CBC and a CMP.  I will also check a TSH to ensure adequate dosage of his levothyroxine along with a fasting lipid panel as the patient has not eaten all day.  Regarding his depression he wants to wean off Effexor.  He feels that this medication is given him nightmares.  We will decrease to 75 mg a day for 2 weeks and then 37.5 mg a day for 2 weeks and then stop.  Meanwhile start Zoloft 50 mg p.o. nightly and reassess in 4 weeks.

## 2019-11-04 LAB — HEMOGLOBIN A1C
Hgb A1c MFr Bld: 14 % of total Hgb — ABNORMAL HIGH (ref <5.7)
Mean Plasma Glucose: 355 (calc)
eAG (mmol/L): 19.7 (calc)

## 2019-11-04 LAB — COMPLETE METABOLIC PANEL WITH GFR
AG Ratio: 1 (calc) (ref 1.0–2.5)
ALT: 139 U/L — ABNORMAL HIGH (ref 9–46)
AST: 111 U/L — ABNORMAL HIGH (ref 10–35)
Albumin: 3.8 g/dL (ref 3.6–5.1)
Alkaline phosphatase (APISO): 157 U/L — ABNORMAL HIGH (ref 35–144)
BUN: 15 mg/dL (ref 7–25)
CO2: 25 mmol/L (ref 20–32)
Calcium: 9 mg/dL (ref 8.6–10.3)
Chloride: 97 mmol/L — ABNORMAL LOW (ref 98–110)
Creat: 0.95 mg/dL (ref 0.70–1.25)
GFR, Est African American: 94 mL/min/{1.73_m2} (ref >=60)
GFR, Est Non African American: 81 mL/min/{1.73_m2} (ref >=60)
Globulin: 3.8 g/dL (calc) — ABNORMAL HIGH (ref 1.9–3.7)
Glucose, Bld: 307 mg/dL — ABNORMAL HIGH (ref 65–99)
Potassium: 4.6 mmol/L (ref 3.5–5.3)
Sodium: 133 mmol/L — ABNORMAL LOW (ref 135–146)
Total Bilirubin: 0.6 mg/dL (ref 0.2–1.2)
Total Protein: 7.6 g/dL (ref 6.1–8.1)

## 2019-11-04 LAB — CBC WITH DIFFERENTIAL/PLATELET
Absolute Monocytes: 1389 cells/uL — ABNORMAL HIGH (ref 200–950)
Basophils Absolute: 64 cells/uL (ref 0–200)
Basophils Relative: 0.7 %
Eosinophils Absolute: 110 cells/uL (ref 15–500)
Eosinophils Relative: 1.2 %
HCT: 44.4 % (ref 38.5–50.0)
Hemoglobin: 15.2 g/dL (ref 13.2–17.1)
Lymphs Abs: 2374 cells/uL (ref 850–3900)
MCH: 35.3 pg — ABNORMAL HIGH (ref 27.0–33.0)
MCHC: 34.2 g/dL (ref 32.0–36.0)
MCV: 103.3 fL — ABNORMAL HIGH (ref 80.0–100.0)
MPV: 11.7 fL (ref 7.5–12.5)
Monocytes Relative: 15.1 %
Neutro Abs: 5262 cells/uL (ref 1500–7800)
Neutrophils Relative %: 57.2 %
Platelets: 191 10*3/uL (ref 140–400)
RBC: 4.3 10*6/uL (ref 4.20–5.80)
RDW: 11.9 % (ref 11.0–15.0)
Total Lymphocyte: 25.8 %
WBC: 9.2 10*3/uL (ref 3.8–10.8)

## 2019-11-04 LAB — TSH: TSH: 0.33 mIU/L — ABNORMAL LOW (ref 0.40–4.50)

## 2019-11-06 ENCOUNTER — Encounter: Payer: Self-pay | Admitting: Family Medicine

## 2019-11-06 ENCOUNTER — Ambulatory Visit (INDEPENDENT_AMBULATORY_CARE_PROVIDER_SITE_OTHER): Payer: Medicare Other | Admitting: Family Medicine

## 2019-11-06 ENCOUNTER — Other Ambulatory Visit: Payer: Self-pay

## 2019-11-06 VITALS — BP 144/78 | HR 90 | Temp 96.7°F | Resp 16 | Ht 66.0 in

## 2019-11-06 DIAGNOSIS — E1165 Type 2 diabetes mellitus with hyperglycemia: Secondary | ICD-10-CM | POA: Diagnosis not present

## 2019-11-06 DIAGNOSIS — N182 Chronic kidney disease, stage 2 (mild): Secondary | ICD-10-CM | POA: Diagnosis not present

## 2019-11-06 DIAGNOSIS — R7989 Other specified abnormal findings of blood chemistry: Secondary | ICD-10-CM

## 2019-11-06 DIAGNOSIS — E1122 Type 2 diabetes mellitus with diabetic chronic kidney disease: Secondary | ICD-10-CM | POA: Diagnosis not present

## 2019-11-06 DIAGNOSIS — IMO0002 Reserved for concepts with insufficient information to code with codable children: Secondary | ICD-10-CM

## 2019-11-06 MED ORDER — METFORMIN HCL 500 MG PO TABS: 500.0000 mg | ORAL_TABLET | Freq: Two times a day (BID) | ORAL | 3 refills | Status: DC

## 2019-11-06 MED ORDER — GLIPIZIDE ER 10 MG PO TB24: 10.0000 mg | ORAL_TABLET | Freq: Every day | ORAL | 4 refills | Status: DC

## 2019-11-06 NOTE — Progress Notes (Signed)
Subjective:    Patient ID: Shaun Gomez, male    DOB: 09/03/1951, 69 y.o.   MRN: [id number]  Medication Refill   10/31/18 Patient has not been seen in over a year.  Approximately 1 week ago, he was diagnosed with bronchitis at a local urgent care.  At that time he had a cough productive of yellow and green sputum and chest congestion.  The cough has improved however he still reports a feeling in his chest that he describes as congestion.  He denies any chest pain.  He denies any chest pressure.  Although he does report some mild shortness of breath with activity.  He also reports extreme fatigue.  However he is very tearful.  A close friend died in 10/27/23.  Since that time, he states that he has been overcome with anxiety.  He reports worsening depression unrelieved by his Zoloft 50 mg a day.  He reports anhedonia.  He reports perseverating thoughts keeping him awake at night.  He denies any suicidal ideation or homicidal ideation.  So retired from his job in the beginning of 2023-10-27.  This was a large part of his life.  Since this is been taken away, he is finding it difficult to find energy or motivation.  Yesterday he had a wave of anxiety come over him but he denies any panic attacks.  At that time, my plan was: From an infectious disease standpoint, his bronchitis has apparently resolved.  I will obtain a chest x-ray to ensure that there is no residual chest infection causing the congestion-like feeling in his chest.  I think he needs more time to allow this to clear.  I do not clinically see any evidence of pneumonia.  His symptoms are not classic for coronary disease.  He denies any angina.  He denies any chest pain or chest pressure.  I feel the majority of his symptoms including fatigue could be secondary to anxiety and depression.  Patient agrees.  Therefore will increase his Zoloft to 100 mg a day.  Meanwhile given his history of noncompliance, I will check an A1c.  I will also check a TSH,  testosterone level, vitamin B12, CBC, and CMP to evaluate for any underlying metabolic or laboratory abnormalities that could explain his fatigue.  I believe the fatigue is most likely secondary to depression.  11/18/18  Patient was found to have mild hypogonadism and mild elevations in his liver function test although he has an ultrasound from 2017 that showed fatty liver disease.  He try to increase the Zoloft to 100 mg a day however he felt "overmedicated and therefore decreased to 75 mg a day".  He has not seen any improvement in his energy level.  He still feels like he has "lost his zest for living".  He denies any suicidal ideation or hallucinations however he does report anhedonia, sadness, low energy, fatigue, poor libido, and erectile problems.  At that time, my plan was: still believe the majority of his symptoms are due to depression.  I recommended decreasing Zoloft to 25 mg every day for 2 weeks and then 25 mg every other day for 2 weeks and then 25 mg every third day for 2 weeks then discontinue the Zoloft.  Meanwhile I recommended starting Effexor 75 mg p.o. every morning for 2 weeks and then increasing to 150 mg p.o. every morning thereafter and then recheck here in 6 weeks to see how the patient is doing or sooner if getting worse  11/03/19 Patient has not been seen since.  He feels that his depression is worse.  He states that he has no energy.  He has a hard time leaving the bed in the morning due to lack of motivation to face the day.  He denies any joy in life.  He feels like he is just existing.  He does not find pleasure or excitement in anything that he does.  He denies any suicidal ideation.  He does have lack of motivation and lack of appetite.  He has lost 12 pounds.  He states that he just does not feel like eating sometimes.  Unfortunately he also reports polyuria.  He is also having drank a lot of water because he is always thirsty.  He has not been checking his blood sugar.  He  denies any blurry vision that is new.  He does have a history of type 2 diabetes mellitus although last time we checked his hemoglobin A1c was 6.6 in January of last year.  His TSH has not been checked in quite some time either.  Blood pressure today is well controlled at 134/78.  He denies any chest pain shortness of breath or dyspnea on exertion.  He denies any nausea or vomiting or diarrhea.  He denies any melena or hematochezia.  At that time, my plan was: Obtain baseline labs to evaluate the patient's underlying medical conditions.  Given his weight loss am concerned about uncontrolled diabetes.  Therefore I will check a hemoglobin A1c along with a CBC and a CMP.  I will also check a TSH to ensure adequate dosage of his levothyroxine along with a fasting lipid panel as the patient has not eaten all day.  Regarding his depression he wants to wean off Effexor.  He feels that this medication is given him nightmares.  We will decrease to 75 mg a day for 2 weeks and then 37.5 mg a day for 2 weeks and then stop.  Meanwhile start Zoloft 50 mg p.o. nightly and reassess in 4 weeks. Office Visit on 11/03/2019  Component Date Value Ref Range Status  . Hgb A1c MFr Bld 11/03/2019 14.0* <5.7 % of total Hgb Final   Comment: For someone without known diabetes, a hemoglobin A1c value of 6.5% or greater indicates that they may have  diabetes and this should be confirmed with a follow-up  test. . For someone with known diabetes, a value <7% indicates  that their diabetes is well controlled and a value  greater than or equal to 7% indicates suboptimal  control. A1c targets should be individualized based on  duration of diabetes, age, comorbid conditions, and  other considerations. . Currently, no consensus exists regarding use of hemoglobin A1c for diagnosis of diabetes for children. .   . Mean Plasma Glucose 11/03/2019 355  (calc) Final  . eAG (mmol/L) 11/03/2019 19.7  (calc) Final  . TSH 11/03/2019 0.33*  0.40 - 4.50 mIU/L Final  . WBC 11/03/2019 9.2  3.8 - 10.8 Thousand/uL Final  . RBC 11/03/2019 4.30  4.20 - 5.80 Million/uL Final  . Hemoglobin 11/03/2019 15.2  13.2 - 17.1 g/dL Final  . HCT 50/35/4656 44.4  38.5 - 50.0 % Final  . MCV 11/03/2019 103.3* 80.0 - 100.0 fL Final  . MCH 11/03/2019 35.3* 27.0 - 33.0 pg Final  . MCHC 11/03/2019 34.2  32.0 - 36.0 g/dL Final  . RDW 81/27/5170 11.9  11.0 - 15.0 % Final  . Platelets 11/03/2019 191  140 - 400  Thousand/uL Final  . MPV 11/03/2019 11.7  7.5 - 12.5 fL Final  . Neutro Abs 11/03/2019 5,262  1,500 - 7,800 cells/uL Final  . Lymphs Abs 11/03/2019 2,374  850 - 3,900 cells/uL Final  . Absolute Monocytes 11/03/2019 1,389* 200 - 950 cells/uL Final  . Eosinophils Absolute 11/03/2019 110  15 - 500 cells/uL Final  . Basophils Absolute 11/03/2019 64  0 - 200 cells/uL Final  . Neutrophils Relative % 11/03/2019 57.2  % Final  . Total Lymphocyte 11/03/2019 25.8  % Final  . Monocytes Relative 11/03/2019 15.1  % Final  . Eosinophils Relative 11/03/2019 1.2  % Final  . Basophils Relative 11/03/2019 0.7  % Final  . Glucose, Bld 11/03/2019 307* 65 - 99 mg/dL Final   Comment: .            Fasting reference interval . For someone without known diabetes, a glucose value >125 mg/dL indicates that they may have diabetes and this should be confirmed with a follow-up test. .   . BUN 11/03/2019 15  7 - 25 mg/dL Final  . Creat 11/03/2019 0.95  0.70 - 1.25 mg/dL Final   Comment: For patients >35 years of age, the reference limit for Creatinine is approximately 13% higher for people identified as African-American. .   . GFR, Est Non African American 11/03/2019 81  > OR = 60 mL/min/1.61m2 Final  . GFR, Est African American 11/03/2019 94  > OR = 60 mL/min/1.33m2 Final  . BUN/Creatinine Ratio 35/36/1443 NOT APPLICABLE  6 - 22 (calc) Final  . Sodium 11/03/2019 133* 135 - 146 mmol/L Final  . Potassium 11/03/2019 4.6  3.5 - 5.3 mmol/L Final  . Chloride  11/03/2019 97* 98 - 110 mmol/L Final  . CO2 11/03/2019 25  20 - 32 mmol/L Final  . Calcium 11/03/2019 9.0  8.6 - 10.3 mg/dL Final  . Total Protein 11/03/2019 7.6  6.1 - 8.1 g/dL Final  . Albumin 11/03/2019 3.8  3.6 - 5.1 g/dL Final  . Globulin 11/03/2019 3.8* 1.9 - 3.7 g/dL (calc) Final  . AG Ratio 11/03/2019 1.0  1.0 - 2.5 (calc) Final  . Total Bilirubin 11/03/2019 0.6  0.2 - 1.2 mg/dL Final  . Alkaline phosphatase (APISO) 11/03/2019 157* 35 - 144 U/L Final  . AST 11/03/2019 111* 10 - 35 U/L Final  . ALT 11/03/2019 139* 9 - 46 U/L Final   Please see lab work above.  Patient's hemoglobin A1c was uncontrolled at 14.  I brought patient back today to discuss treatment options.  Spent more than 25 minutes today with the patient explaining a low carbohydrate diet, how to carb count, and what foods to avoid.  We also discussed treatment options including insulin versus oral hypoglycemic agents.  Patient elects to try pills and work on his diet.  Therefore I recommended we begin glipizide extended release 10 mg a day in addition to Metformin and gradually uptitrate Metformin to 1000 mg twice a day.  Patient agrees to do this.  We also discussed how to check his blood sugar.  Of note his liver function tests were extremely high.  I discussed obtaining a right upper quadrant ultrasound with the patient however the patient reports that he is drinking a large quantity of alcohol every day.  He drinks more than a bottle of wine a day.  He denies any withdrawal symptoms when he does not drink alcohol.  Specifically he denies any resting tremor in his hands.  However he prefers to  discontinue alcohol prior to obtaining any ultrasound of liver.   Past Medical History:  Diagnosis Date  . CAD (coronary artery disease)   . Depression 07/1993  . Diabetes mellitus without complication (HCC)   . Hyperlipidemia 02/1996  . Hypertension pre 08/25/1996  . OSA on CPAP   . Thyroid disease 07/1993   Hypothyroidism    Past Surgical History:  Procedure Laterality Date  . APPENDECTOMY  2 YOA  . CATARACT EXTRACTION, BILATERAL    . CORONARY ARTERY BYPASS GRAFT  02/1996   X 2 EF 30%  . MI S/P Caverject Injection  02/1996  . RETINAL DETACHMENT SURGERY    . Sleep Study  01/03/2006   Severe sleep apnea - 88 events per hour )2 sats down to 50% ?  Marland Kitchen. Stress Cardiolite  03/1996   EF 30%   Current Outpatient Medications on File Prior to Visit  Medication Sig Dispense Refill  . Ascorbic Acid (VITAMIN C) 1000 MG tablet Take 1,000 mg by mouth daily.    Marland Kitchen. aspirin 325 MG tablet Take 325 mg by mouth daily.    . cholecalciferol (VITAMIN D) 1000 UNITS tablet Take 1,000 Units by mouth daily.    Marland Kitchen. latanoprost (XALATAN) 0.005 % ophthalmic solution Place 1 drop into both eyes at bedtime.     Marland Kitchen. levothyroxine (SYNTHROID) 175 MCG tablet TAKE 1 TABLET DAILY BEFORE BREAKFAST. 90 tablet 2  . lisinopril (PRINIVIL,ZESTRIL) 20 MG tablet Take 1 tablet (20 mg total) by mouth daily. 90 tablet 3  . Multiple Vitamin (MULTIVITAMIN) tablet Take 1 tablet by mouth daily.    . sertraline (ZOLOFT) 50 MG tablet Take 1 tablet (50 mg total) by mouth daily. 30 tablet 3  . venlafaxine XR (EFFEXOR XR) 37.5 MG 24 hr capsule Take 1 capsule (37.5 mg total) by mouth daily with breakfast. Use to wean off as we discussed 30 capsule 0  . vitamin B-12 (CYANOCOBALAMIN) 1000 MCG tablet Take 1,000 mcg by mouth daily.     No current facility-administered medications on file prior to visit.   Allergies  Allergen Reactions  . Caverject [Alprostadil]     MI after injection   Social History   Socioeconomic History  . Marital status: Married    Spouse name: Not on file  . Number of children: 0  . Years of education: Not on file  . Highest education level: Not on file  Occupational History  . 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 Smoking status: Former Smoker     Packs/day: 2.00    Years: 15.00    Pack years: 30.00    Types: Cigarettes  . Smokeless tobacco: Never Used  . Tobacco comment: Quit 1983  Substance and Sexual Activity  . Alcohol use: Yes    Comment: case of beer   . Drug use: Not on file  . Sexual activity: Not on file  Other Topics Concern  . Not on file  Social History Narrative   Re-married, lives with wife   2 step-children, 2 granddaughters   Working at [employer] [place], Materials engineerproject developer   Social Determinants of Health   Financial Resource Strain:   . Difficulty of Paying Living Expenses: Not on file  Food Insecurity:   . Worried About Programme researcher, broadcasting/film/videounning Out of Food in the Last Year: Not on file  . Ran Out of Food in the Last Year: Not on file  Transportation  Needs:   . Lack of Transportation (Medical): Not on file  . Lack of Transportation (Non-Medical): Not on file  Physical Activity:   . Days of Exercise per Week: Not on file  . Minutes of Exercise per Session: Not on file  Stress:   . Feeling of Stress : Not on file  Social Connections:   . Frequency of Communication with Friends and Family: Not on file  . Frequency of Social Gatherings with Friends and Family: Not on file  . Attends Religious Services: Not on file  . Active Member of Clubs or Organizations: Not on file  . Attends BankerClub or Organization Meetings: Not on file  . Marital Status: Not on file  Intimate Partner Violence:   . Fear of Current or Ex-Partner: Not on file  . Emotionally Abused: Not on file  . Physically Abused: Not on file  . Sexually Abused: Not on file   Family History  Problem Relation Age of Onset  . Hypertension Mother   . Parkinsonism Mother   . Dementia Mother   . Diabetes Mother   . Stroke Father [id number]  . Depression Sister   . Anxiety disorder Sister   . Heart disease Brother [id number]       CAD  . Hyperlipidemia Brother   . Hypertension Brother   . Arthritis Neg Hx   . Cancer Neg Hx   . Alcohol abuse Neg Hx   . Drug  abuse Neg Hx   . Colon cancer Neg Hx   . Prostate cancer Neg Hx   . Heart disease Brother [id number]       CAD      Review of Systems  All other systems reviewed and are negative.      Objective:   Physical Exam  Constitutional: He is oriented to person, place, and time. He appears well-developed and well-nourished. No distress.  HENT:  Head: Normocephalic and atraumatic.  Right Ear: External ear normal.  Left Ear: External ear normal.  Nose: Nose normal.  Mouth/Throat: Oropharynx is clear and moist. No oropharyngeal exudate.  Eyes: Pupils are equal, round, and reactive to light. Conjunctivae and EOM are normal. No scleral icterus.  Neck: No JVD present. No thyromegaly present.  Cardiovascular: Normal rate, regular rhythm and normal heart sounds.  No murmur heard. Pulmonary/Chest: Effort normal and breath sounds normal. No respiratory distress. He has no wheezes. He has no rales.  Abdominal: Soft. Bowel sounds are normal. He exhibits no distension. There is no abdominal tenderness. There is no rebound and no guarding.  Musculoskeletal:        General: No edema.     Cervical back: Neck supple.  Lymphadenopathy:    He has no cervical adenopathy.  Neurological: He is alert and oriented to person, place, and time. He has normal reflexes. No cranial nerve deficit. He exhibits normal muscle tone. Coordination normal.  Skin: He is not diaphoretic.  Vitals reviewed.        Assessment & Plan:  Uncontrolled diabetes mellitus with stage 2 chronic kidney disease (HCC)  Elevated liver function tests  Spent more than 25 minutes today with the patient discussing his options.  Recommended a low carbohydrate diet.  Recommend weight loss.  Recommended cessation of all alcohol and sedatives.  Start glipizide extended release 10 mg daily.  Add Metformin 500 mg a day and gradually uptitrate to about milligrams twice a day to avoid diarrhea.  Recheck fasting blood sugars and 2-hour postprandial  sugars 1 month.  Recommended that he discontinue all alcohol.  Offered the patient a right upper quadrant ultrasound which he declined.  Discontinue all alcohol and recheck CMP in 2 weeks.  If liver function tests remain persistently elevated at that point we will obtain a right upper quadrant ultrasound.

## 2019-11-20 ENCOUNTER — Other Ambulatory Visit: Payer: Self-pay

## 2019-11-20 ENCOUNTER — Ambulatory Visit (INDEPENDENT_AMBULATORY_CARE_PROVIDER_SITE_OTHER): Payer: Medicare Other | Admitting: Family Medicine

## 2019-11-20 VITALS — BP 120/70 | HR 90 | Temp 96.3°F | Resp 14 | Ht 66.0 in | Wt 186.0 lb

## 2019-11-20 DIAGNOSIS — R7989 Other specified abnormal findings of blood chemistry: Secondary | ICD-10-CM | POA: Diagnosis not present

## 2019-11-20 LAB — COMPLETE METABOLIC PANEL WITH GFR
AG Ratio: 1.1 (calc) (ref 1.0–2.5)
ALT: 115 U/L — ABNORMAL HIGH (ref 9–46)
AST: 84 U/L — ABNORMAL HIGH (ref 10–35)
Albumin: 4.1 g/dL (ref 3.6–5.1)
Alkaline phosphatase (APISO): 111 U/L (ref 35–144)
BUN/Creatinine Ratio: 26 (calc) — ABNORMAL HIGH (ref 6–22)
BUN: 27 mg/dL — ABNORMAL HIGH (ref 7–25)
CO2: 23 mmol/L (ref 20–32)
Calcium: 9.6 mg/dL (ref 8.6–10.3)
Chloride: 102 mmol/L (ref 98–110)
Creat: 1.05 mg/dL (ref 0.70–1.25)
GFR, Est African American: 84 mL/min/{1.73_m2} (ref >=60)
GFR, Est Non African American: 72 mL/min/{1.73_m2} (ref >=60)
Globulin: 3.9 g/dL (calc) — ABNORMAL HIGH (ref 1.9–3.7)
Glucose, Bld: 133 mg/dL — ABNORMAL HIGH (ref 65–99)
Potassium: 4.5 mmol/L (ref 3.5–5.3)
Sodium: 135 mmol/L (ref 135–146)
Total Bilirubin: 0.4 mg/dL (ref 0.2–1.2)
Total Protein: 8 g/dL (ref 6.1–8.1)

## 2019-11-20 MED ORDER — SERTRALINE HCL 100 MG PO TABS: 100.0000 mg | ORAL_TABLET | Freq: Every day | ORAL | 3 refills | Status: DC

## 2019-11-20 NOTE — Progress Notes (Signed)
Subjective:    Patient ID: Shaun Gomez, male    DOB: 09/03/1951, 69 y.o.   MRN: [id number]  Medication Refill   10/31/18 Patient has not been seen in over a year.  Approximately 1 week ago, he was diagnosed with bronchitis at a local urgent care.  At that time he had a cough productive of yellow and green sputum and chest congestion.  The cough has improved however he still reports a feeling in his chest that he describes as congestion.  He denies any chest pain.  He denies any chest pressure.  Although he does report some mild shortness of breath with activity.  He also reports extreme fatigue.  However he is very tearful.  A close friend died in 10/27/23.  Since that time, he states that he has been overcome with anxiety.  He reports worsening depression unrelieved by his Zoloft 50 mg a day.  He reports anhedonia.  He reports perseverating thoughts keeping him awake at night.  He denies any suicidal ideation or homicidal ideation.  So retired from his job in the beginning of 2023-10-27.  This was a large part of his life.  Since this is been taken away, he is finding it difficult to find energy or motivation.  Yesterday he had a wave of anxiety come over him but he denies any panic attacks.  At that time, my plan was: From an infectious disease standpoint, his bronchitis has apparently resolved.  I will obtain a chest x-ray to ensure that there is no residual chest infection causing the congestion-like feeling in his chest.  I think he needs more time to allow this to clear.  I do not clinically see any evidence of pneumonia.  His symptoms are not classic for coronary disease.  He denies any angina.  He denies any chest pain or chest pressure.  I feel the majority of his symptoms including fatigue could be secondary to anxiety and depression.  Patient agrees.  Therefore will increase his Zoloft to 100 mg a day.  Meanwhile given his history of noncompliance, I will check an A1c.  I will also check a TSH,  testosterone level, vitamin B12, CBC, and CMP to evaluate for any underlying metabolic or laboratory abnormalities that could explain his fatigue.  I believe the fatigue is most likely secondary to depression.  11/18/18  Patient was found to have mild hypogonadism and mild elevations in his liver function test although he has an ultrasound from 2017 that showed fatty liver disease.  He try to increase the Zoloft to 100 mg a day however he felt "overmedicated and therefore decreased to 75 mg a day".  He has not seen any improvement in his energy level.  He still feels like he has "lost his zest for living".  He denies any suicidal ideation or hallucinations however he does report anhedonia, sadness, low energy, fatigue, poor libido, and erectile problems.  At that time, my plan was: still believe the majority of his symptoms are due to depression.  I recommended decreasing Zoloft to 25 mg every day for 2 weeks and then 25 mg every other day for 2 weeks and then 25 mg every third day for 2 weeks then discontinue the Zoloft.  Meanwhile I recommended starting Effexor 75 mg p.o. every morning for 2 weeks and then increasing to 150 mg p.o. every morning thereafter and then recheck here in 6 weeks to see how the patient is doing or sooner if getting worse  11/03/19 Patient has not been seen since.  He feels that his depression is worse.  He states that he has no energy.  He has a hard time leaving the bed in the morning due to lack of motivation to face the day.  He denies any joy in life.  He feels like he is just existing.  He does not find pleasure or excitement in anything that he does.  He denies any suicidal ideation.  He does have lack of motivation and lack of appetite.  He has lost 12 pounds.  He states that he just does not feel like eating sometimes.  Unfortunately he also reports polyuria.  He is also having drank a lot of water because he is always thirsty.  He has not been checking his blood sugar.  He  denies any blurry vision that is new.  He does have a history of type 2 diabetes mellitus although last time we checked his hemoglobin A1c was 6.6 in January of last year.  His TSH has not been checked in quite some time either.  Blood pressure today is well controlled at 134/78.  He denies any chest pain shortness of breath or dyspnea on exertion.  He denies any nausea or vomiting or diarrhea.  He denies any melena or hematochezia.  At that time, my plan was: Obtain baseline labs to evaluate the patient's underlying medical conditions.  Given his weight loss am concerned about uncontrolled diabetes.  Therefore I will check a hemoglobin A1c along with a CBC and a CMP.  I will also check a TSH to ensure adequate dosage of his levothyroxine along with a fasting lipid panel as the patient has not eaten all day.  Regarding his depression he wants to wean off Effexor.  He feels that this medication is given him nightmares.  We will decrease to 75 mg a day for 2 weeks and then 37.5 mg a day for 2 weeks and then stop.  Meanwhile start Zoloft 50 mg p.o. nightly and reassess in 4 weeks. No visits with results within 1 Week(s) from this visit.  Latest known visit with results is:  Office Visit on 11/03/2019  Component Date Value Ref Range Status  . Hgb A1c MFr Bld 11/03/2019 14.0* <5.7 % of total Hgb Final   Comment: For someone without known diabetes, a hemoglobin A1c value of 6.5% or greater indicates that they may have  diabetes and this should be confirmed with a follow-up  test. . For someone with known diabetes, a value <7% indicates  that their diabetes is well controlled and a value  greater than or equal to 7% indicates suboptimal  control. A1c targets should be individualized based on  duration of diabetes, age, comorbid conditions, and  other considerations. . Currently, no consensus exists regarding use of hemoglobin A1c for diagnosis of diabetes for children. .   . Mean Plasma Glucose  11/03/2019 355  (calc) Final  . eAG (mmol/L) 11/03/2019 19.7  (calc) Final  . TSH 11/03/2019 0.33* 0.40 - 4.50 mIU/L Final  . WBC 11/03/2019 9.2  3.8 - 10.8 Thousand/uL Final  . RBC 11/03/2019 4.30  4.20 - 5.80 Million/uL Final  . Hemoglobin 11/03/2019 15.2  13.2 - 17.1 g/dL Final  . HCT 30/16/0109 44.4  38.5 - 50.0 % Final  . MCV 11/03/2019 103.3* 80.0 - 100.0 fL Final  . MCH 11/03/2019 35.3* 27.0 - 33.0 pg Final  . MCHC 11/03/2019 34.2  32.0 - 36.0 g/dL Final  .  RDW 11/03/2019 11.9  11.0 - 15.0 % Final  . Platelets 11/03/2019 191  140 - 400 Thousand/uL Final  . MPV 11/03/2019 11.7  7.5 - 12.5 fL Final  . Neutro Abs 11/03/2019 5,262  1,500 - 7,800 cells/uL Final  . Lymphs Abs 11/03/2019 2,374  850 - 3,900 cells/uL Final  . Absolute Monocytes 11/03/2019 1,389* 200 - 950 cells/uL Final  . Eosinophils Absolute 11/03/2019 110  15 - 500 cells/uL Final  . Basophils Absolute 11/03/2019 64  0 - 200 cells/uL Final  . Neutrophils Relative % 11/03/2019 57.2  % Final  . Total Lymphocyte 11/03/2019 25.8  % Final  . Monocytes Relative 11/03/2019 15.1  % Final  . Eosinophils Relative 11/03/2019 1.2  % Final  . Basophils Relative 11/03/2019 0.7  % Final  . Glucose, Bld 11/03/2019 307* 65 - 99 mg/dL Final   Comment: .            Fasting reference interval . For someone without known diabetes, a glucose value >125 mg/dL indicates that they may have diabetes and this should be confirmed with a follow-up test. .   . BUN 11/03/2019 15  7 - 25 mg/dL Final  . Creat 00/34/9179 0.95  0.70 - 1.25 mg/dL Final   Comment: For patients >27 years of age, the reference limit for Creatinine is approximately 13% higher for people identified as African-American. .   . GFR, Est Non African American 11/03/2019 81  > OR = 60 mL/min/1.57m2 Final  . GFR, Est African American 11/03/2019 94  > OR = 60 mL/min/1.79m2 Final  . BUN/Creatinine Ratio 11/03/2019 NOT APPLICABLE  6 - 22 (calc) Final  . Sodium 11/03/2019  133* 135 - 146 mmol/L Final  . Potassium 11/03/2019 4.6  3.5 - 5.3 mmol/L Final  . Chloride 11/03/2019 97* 98 - 110 mmol/L Final  . CO2 11/03/2019 25  20 - 32 mmol/L Final  . Calcium 11/03/2019 9.0  8.6 - 10.3 mg/dL Final  . Total Protein 11/03/2019 7.6  6.1 - 8.1 g/dL Final  . Albumin 15/02/6978 3.8  3.6 - 5.1 g/dL Final  . Globulin 48/10/6551 3.8* 1.9 - 3.7 g/dL (calc) Final  . AG Ratio 11/03/2019 1.0  1.0 - 2.5 (calc) Final  . Total Bilirubin 11/03/2019 0.6  0.2 - 1.2 mg/dL Final  . Alkaline phosphatase (APISO) 11/03/2019 157* 35 - 144 U/L Final  . AST 11/03/2019 111* 10 - 35 U/L Final  . ALT 11/03/2019 139* 9 - 46 U/L Final   Please see lab work above.  Patient's hemoglobin A1c was uncontrolled at 14.  I brought patient back today to discuss treatment options.  Spent more than 25 minutes today with the patient explaining a low carbohydrate diet, how to carb count, and what foods to avoid.  We also discussed treatment options including insulin versus oral hypoglycemic agents.  Patient elects to try pills and work on his diet.  Therefore I recommended we begin glipizide extended release 10 mg a day in addition to Metformin and gradually uptitrate Metformin to 1000 mg twice a day.  Patient agrees to do this.  We also discussed how to check his blood sugar.  Of note his liver function tests were extremely high.  I discussed obtaining a right upper quadrant ultrasound with the patient however the patient reports that he is drinking a large quantity of alcohol every day.  He drinks more than a bottle of wine a day.  He denies any withdrawal symptoms when he does  not drink alcohol.  Specifically he denies any resting tremor in his hands.  However he prefers to discontinue alcohol prior to obtaining any ultrasound of liver.  At that time (11/06/19) my plan was: Spent more than 25 minutes today with the patient discussing his options.  Recommended a low carbohydrate diet.  Recommend weight loss.   Recommended cessation of all alcohol and sedatives.  Start glipizide extended release 10 mg daily.  Add Metformin 500 mg a day and gradually uptitrate to about milligrams twice a day to avoid diarrhea.  Recheck fasting blood sugars and 2-hour postprandial sugars 1 month.  Recommended that he discontinue all alcohol.  Offered the patient a right upper quadrant ultrasound which he declined.  Discontinue all alcohol and recheck CMP in 2 weeks.  If liver function tests remain persistently elevated at that point we will obtain a right upper quadrant ultrasound.  11/20/19 Patient has entirely stopped drinking.  He is up to 500 mg twice a day Metformin and glipizide extended release 10 mg a day.  His fasting blood sugars in the morning between 130 and 145.  2-hour postprandial sugars are all less than 100.  I am very happy with his blood sugars thus far.  However the patient is here today primarily to recheck his liver function test they were significantly elevated.  He attributed this to drinking large quantities of alcohol.  He also reports that his depression has gotten more serious.  He has anhedonia.  He finds no pleasure in his daily activities.  He feels sad the majority of the time with little purpose or meaning.  He reports low energy.  He reports low concentration.  He is taking Zoloft 50 mg a day.   Past Medical History:  Diagnosis Date  . CAD (coronary artery disease)   . Depression 07/1993  . Diabetes mellitus without complication (HCC)   . Hyperlipidemia 02/1996  . Hypertension pre 08/25/1996  . OSA on CPAP   . Thyroid disease 07/1993   Hypothyroidism   Past Surgical History:  Procedure Laterality Date  . APPENDECTOMY  2 YOA  . CATARACT EXTRACTION, BILATERAL    . CORONARY ARTERY BYPASS GRAFT  02/1996   X 2 EF 30%  . MI S/P Caverject Injection  02/1996  . RETINAL DETACHMENT SURGERY    . Sleep Study  01/03/2006   Severe sleep apnea - 88 events per hour )2 sats down to 50% ?  Marland Kitchen Stress  Cardiolite  03/1996   EF 30%   Current Outpatient Medications on File Prior to Visit  Medication Sig Dispense Refill  . Ascorbic Acid (VITAMIN C) 1000 MG tablet Take 1,000 mg by mouth daily.    Marland Kitchen aspirin 325 MG tablet Take 325 mg by mouth daily.    . cholecalciferol (VITAMIN D) 1000 UNITS tablet Take 1,000 Units by mouth daily.    Marland Kitchen glipiZIDE (GLUCOTROL XL) 10 MG 24 hr tablet Take 1 tablet (10 mg total) by mouth daily with breakfast. 30 tablet 4  . latanoprost (XALATAN) 0.005 % ophthalmic solution Place 1 drop into both eyes at bedtime.     Marland Kitchen levothyroxine (SYNTHROID) 175 MCG tablet TAKE 1 TABLET DAILY BEFORE BREAKFAST. 90 tablet 2  . lisinopril (PRINIVIL,ZESTRIL) 20 MG tablet Take 1 tablet (20 mg total) by mouth daily. 90 tablet 3  . metFORMIN (GLUCOPHAGE) 500 MG tablet Take 1 tablet (500 mg total) by mouth 2 (two) times daily with a meal. 180 tablet 3  . Multiple Vitamin (MULTIVITAMIN) tablet  Take 1 tablet by mouth daily.    Marland Kitchen venlafaxine XR (EFFEXOR XR) 37.5 MG 24 hr capsule Take 1 capsule (37.5 mg total) by mouth daily with breakfast. Use to wean off as we discussed 30 capsule 0  . vitamin B-12 (CYANOCOBALAMIN) 1000 MCG tablet Take 1,000 mcg by mouth daily.     No current facility-administered medications on file prior to visit.   Allergies  Allergen Reactions  . Caverject [Alprostadil]     MI after injection   Social History   Socioeconomic History  . Marital status: Married    Spouse name: Not on file  . Number of children: 0  . Years of education: Not on file  . Highest education level: Not on file  Occupational History  . 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 Smoking status: Former Smoker    Packs/day: 2.00    Years: 15.00    Pack years: 30.00    Types: Cigarettes  . Smokeless tobacco: Never Used  . Tobacco comment: Quit 1983  Substance and Sexual Activity  . Alcohol use: Yes     Comment: case of beer   . Drug use: Not on file  . Sexual activity: Not on file  Other Topics Concern  . Not on file  Social History Narrative   Re-married, lives with wife   2 step-children, 2 granddaughters   Working at [employer] in [employer], Doctor, general practice   Social Determinants of Health   Financial Resource Strain:   . Difficulty of Paying Living Expenses: Not on file  Food Insecurity:   . Worried About Charity fundraiser in the Last Year: Not on file  . Ran Out of Food in the Last Year: Not on file  Transportation Needs:   . Lack of Transportation (Medical): Not on file  . Lack of Transportation (Non-Medical): Not on file  Physical Activity:   . Days of Exercise per Week: Not on file  . Minutes of Exercise per Session: Not on file  Stress:   . Feeling of Stress : Not on file  Social Connections:   . Frequency of Communication with Friends and Family: Not on file  . Frequency of Social Gatherings with Friends and Family: Not on file  . Attends Religious Services: Not on file  . Active Member of Clubs or Organizations: Not on file  . Attends Archivist Meetings: Not on file  . Marital Status: Not on file  Intimate Partner Violence:   . Fear of Current or Ex-Partner: Not on file  . Emotionally Abused: Not on file  . Physically Abused: Not on file  . Sexually Abused: Not on file   Family History  Problem Relation Age of Onset  . Hypertension Mother   . Parkinsonism Mother   . Dementia Mother   . Diabetes Mother   . Stroke Father 59  . Depression Sister   . Anxiety disorder Sister   . Heart disease Brother 25       CAD  . Hyperlipidemia Brother   . Hypertension Brother   . Arthritis Neg Hx   . Cancer Neg Hx   . Alcohol abuse Neg Hx   . Drug abuse Neg Hx   . Colon cancer Neg Hx   . Prostate cancer Neg Hx   . Heart disease Brother 74       CAD  Review of Systems  All other systems reviewed and are negative.        Objective:   Physical Exam  Constitutional: He is oriented to person, place, and time. He appears well-developed and well-nourished. No distress.  HENT:  Head: Normocephalic and atraumatic.  Right Ear: External ear normal.  Left Ear: External ear normal.  Nose: Nose normal.  Mouth/Throat: Oropharynx is clear and moist. No oropharyngeal exudate.  Eyes: Pupils are equal, round, and reactive to light. Conjunctivae and EOM are normal. No scleral icterus.  Neck: No JVD present. No thyromegaly present.  Cardiovascular: Normal rate, regular rhythm and normal heart sounds.  No murmur heard. Pulmonary/Chest: Effort normal and breath sounds normal. No respiratory distress. He has no wheezes. He has no rales.  Abdominal: Soft. Bowel sounds are normal. He exhibits no distension. There is no abdominal tenderness. There is no rebound and no guarding.  Musculoskeletal:        General: No edema.     Cervical back: Neck supple.  Lymphadenopathy:    He has no cervical adenopathy.  Neurological: He is alert and oriented to person, place, and time. He has normal reflexes. No cranial nerve deficit. He exhibits normal muscle tone. Coordination normal.  Skin: He is not diaphoretic.  Vitals reviewed.        Assessment & Plan:  Elevated LFTs - Plan: COMPLETE METABOLIC PANEL WITH GFR Recheck a CMP today.  If liver function tests are still elevated we will proceed with a right upper quadrant ultrasound and viral hepatitis serologies.  Increase Zoloft to 100 mg a day.  Also provided the patient contact information for a psychologist, Dr. Robley Friesobert Mitchum and explained to the patient how to schedule the appointment.  I believe the patient would benefit from cognitive behavioral therapy.

## 2019-11-21 ENCOUNTER — Other Ambulatory Visit: Payer: Self-pay | Admitting: Family Medicine

## 2019-11-21 DIAGNOSIS — R7989 Other specified abnormal findings of blood chemistry: Secondary | ICD-10-CM

## 2019-11-23 ENCOUNTER — Other Ambulatory Visit: Payer: Self-pay | Admitting: Family Medicine

## 2019-11-23 DIAGNOSIS — R7989 Other specified abnormal findings of blood chemistry: Secondary | ICD-10-CM

## 2019-11-28 ENCOUNTER — Other Ambulatory Visit: Payer: Self-pay | Admitting: Family Medicine

## 2019-11-29 ENCOUNTER — Ambulatory Visit
Admission: RE | Admit: 2019-11-29 | Discharge: 2019-11-29 | Disposition: A | Discharge status: Home / Self Care | Payer: Medicare Other | Source: Ambulatory Visit | Attending: Family Medicine | Admitting: Family Medicine

## 2019-11-29 DIAGNOSIS — K760 Fatty (change of) liver, not elsewhere classified: Secondary | ICD-10-CM | POA: Diagnosis not present

## 2019-11-29 DIAGNOSIS — R7989 Other specified abnormal findings of blood chemistry: Secondary | ICD-10-CM

## 2019-12-06 ENCOUNTER — Other Ambulatory Visit: Payer: Self-pay | Admitting: Family Medicine

## 2019-12-13 ENCOUNTER — Other Ambulatory Visit: Payer: Self-pay | Admitting: Family Medicine

## 2019-12-13 MED ORDER — SERTRALINE HCL 100 MG PO TABS: 100.0000 mg | ORAL_TABLET | Freq: Every day | ORAL | 2 refills | Status: DC

## 2019-12-13 NOTE — Addendum Note (Signed)
Addended by: Phillips Odor on: 12/13/2019 04:27 PM   Modules accepted: Orders

## 2019-12-13 NOTE — Addendum Note (Signed)
Addended by: Legrand Rams B on: 12/13/2019 03:03 PM   Modules accepted: Orders

## 2019-12-26 ENCOUNTER — Other Ambulatory Visit: Payer: Self-pay | Admitting: Family Medicine

## 2019-12-26 MED ORDER — SERTRALINE HCL 100 MG PO TABS: 100.0000 mg | ORAL_TABLET | Freq: Every day | ORAL | 2 refills | Status: DC

## 2020-01-03 ENCOUNTER — Telehealth: Payer: Self-pay | Admitting: *Deleted

## 2020-01-03 MED ORDER — LISINOPRIL 20 MG PO TABS: 20.0000 mg | ORAL_TABLET | Freq: Every day | ORAL | 3 refills | Status: DC

## 2020-01-03 NOTE — Telephone Encounter (Signed)
Patient wife in office.   Reports that he is currently out of metformin. Reports that he was instructed to increase metformin 500mg  to (2) tabs BID. States that he is now out of medication and pharmacy will not fill until 02/04/2020.  Per chart notes, patient should be taking metformin 500mg  (1) tab PO BID.   MD please advise.

## 2020-01-04 ENCOUNTER — Other Ambulatory Visit: Payer: Self-pay | Admitting: Family Medicine

## 2020-01-04 MED ORDER — METFORMIN HCL 500 MG PO TABS: 1000.0000 mg | ORAL_TABLET | Freq: Two times a day (BID) | ORAL | 3 refills | Status: DC

## 2020-01-04 NOTE — Telephone Encounter (Signed)
Call placed to patient. LMTRC.  

## 2020-01-04 NOTE — Telephone Encounter (Signed)
He is supposed to be on glipizide xr 10 mg poqday (see ov 11/06/19)

## 2020-01-04 NOTE — Telephone Encounter (Signed)
He should be on 1000 bid and I sent new rx for 1000 bid.

## 2020-01-04 NOTE — Telephone Encounter (Signed)
Patient wife returned call and made aware.   Upon review of medications,  patient wife states that he is not taking Glipizide. Is he supposed to be on this?

## 2020-01-05 NOTE — Telephone Encounter (Signed)
Call placed to patient and patient made aware.  

## 2020-01-12 ENCOUNTER — Other Ambulatory Visit: Payer: Self-pay | Admitting: Family Medicine

## 2020-01-30 ENCOUNTER — Other Ambulatory Visit: Payer: Self-pay | Admitting: Family Medicine

## 2020-04-23 LAB — HM DIABETES EYE EXAM

## 2020-05-30 ENCOUNTER — Encounter: Payer: Self-pay | Admitting: Family Medicine

## 2020-06-03 ENCOUNTER — Ambulatory Visit (INDEPENDENT_AMBULATORY_CARE_PROVIDER_SITE_OTHER): Payer: Medicare Other | Admitting: Family Medicine

## 2020-06-03 ENCOUNTER — Other Ambulatory Visit: Payer: Self-pay

## 2020-06-03 ENCOUNTER — Encounter: Payer: Self-pay | Admitting: Family Medicine

## 2020-06-03 VITALS — BP 130/72 | HR 87 | Temp 97.8°F | Ht 66.0 in | Wt 192.0 lb

## 2020-06-03 DIAGNOSIS — Z9989 Dependence on other enabling machines and devices: Secondary | ICD-10-CM | POA: Diagnosis not present

## 2020-06-03 DIAGNOSIS — E039 Hypothyroidism, unspecified: Secondary | ICD-10-CM | POA: Diagnosis not present

## 2020-06-03 DIAGNOSIS — E119 Type 2 diabetes mellitus without complications: Secondary | ICD-10-CM

## 2020-06-03 DIAGNOSIS — G4733 Obstructive sleep apnea (adult) (pediatric): Secondary | ICD-10-CM

## 2020-06-03 DIAGNOSIS — R7989 Other specified abnormal findings of blood chemistry: Secondary | ICD-10-CM | POA: Diagnosis not present

## 2020-06-03 NOTE — Progress Notes (Signed)
Subjective:    Patient ID: Shaun Gomez, male    DOB: 1951/08/08, 69 y.o.   MRN: [id number]  Medication Refill   10/31/18 Patient has not been seen in over a year.  Approximately 1 week ago, he was diagnosed with bronchitis at a local urgent care.  At that time he had a cough productive of yellow and green sputum and chest congestion.  The cough has improved however he still reports a feeling in his chest that he describes as congestion.  He denies any chest pain.  He denies any chest pressure.  Although he does report some mild shortness of breath with activity.  He also reports extreme fatigue.  However he is very tearful.  A close friend died in December.  Since that time, he states that he has been overcome with anxiety.  He reports worsening depression unrelieved by his Zoloft 50 mg a day.  He reports anhedonia.  He reports perseverating thoughts keeping him awake at night.  He denies any suicidal ideation or homicidal ideation.  So retired from his job in the beginning of December.  This was a large part of his life.  Since this is been taken away, he is finding it difficult to find energy or motivation.  Yesterday he had a wave of anxiety come over him but he denies any panic attacks.  At that time, my plan was: From an infectious disease standpoint, his bronchitis has apparently resolved.  I will obtain a chest x-ray to ensure that there is no residual chest infection causing the congestion-like feeling in his chest.  I think he needs more time to allow this to clear.  I do not clinically see any evidence of pneumonia.  His symptoms are not classic for coronary disease.  He denies any angina.  He denies any chest pain or chest pressure.  I feel the majority of his symptoms including fatigue could be secondary to anxiety and depression.  Patient agrees.  Therefore will increase his Zoloft to 100 mg a day.  Meanwhile given his history of noncompliance, I will check an A1c.  I will also check a TSH,  testosterone level, vitamin B12, CBC, and CMP to evaluate for any underlying metabolic or laboratory abnormalities that could explain his fatigue.  I believe the fatigue is most likely secondary to depression.  11/18/18  Patient was found to have mild hypogonadism and mild elevations in his liver function test although he has an ultrasound from 2017 that showed fatty liver disease.  He try to increase the Zoloft to 100 mg a day however he felt "overmedicated and therefore decreased to 75 mg a day".  He has not seen any improvement in his energy level.  He still feels like he has "lost his zest for living".  He denies any suicidal ideation or hallucinations however he does report anhedonia, sadness, low energy, fatigue, poor libido, and erectile problems.  At that time, my plan was: still believe the majority of his symptoms are due to depression.  I recommended decreasing Zoloft to 25 mg every day for 2 weeks and then 25 mg every other day for 2 weeks and then 25 mg every third day for 2 weeks then discontinue the Zoloft.  Meanwhile I recommended starting Effexor 75 mg p.o. every morning for 2 weeks and then increasing to 150 mg p.o. every morning thereafter and then recheck here in 6 weeks to see how the patient is doing or sooner if getting worse  11/03/19 Patient has not been seen since.  He feels that his depression is worse.  He states that he has no energy.  He has a hard time leaving the bed in the morning due to lack of motivation to face the day.  He denies any joy in life.  He feels like he is just existing.  He does not find pleasure or excitement in anything that he does.  He denies any suicidal ideation.  He does have lack of motivation and lack of appetite.  He has lost 12 pounds.  He states that he just does not feel like eating sometimes.  Unfortunately he also reports polyuria.  He is also having drank a lot of water because he is always thirsty.  He has not been checking his blood sugar.  He  denies any blurry vision that is new.  He does have a history of type 2 diabetes mellitus although last time we checked his hemoglobin A1c was 6.6 in January of last year.  His TSH has not been checked in quite some time either.  Blood pressure today is well controlled at 134/78.  He denies any chest pain shortness of breath or dyspnea on exertion.  He denies any nausea or vomiting or diarrhea.  He denies any melena or hematochezia.  At that time, my plan was: Obtain baseline labs to evaluate the patient's underlying medical conditions.  Given his weight loss am concerned about uncontrolled diabetes.  Therefore I will check a hemoglobin A1c along with a CBC and a CMP.  I will also check a TSH to ensure adequate dosage of his levothyroxine along with a fasting lipid panel as the patient has not eaten all day.  Regarding his depression he wants to wean off Effexor.  He feels that this medication is given him nightmares.  We will decrease to 75 mg a day for 2 weeks and then 37.5 mg a day for 2 weeks and then stop.  Meanwhile start Zoloft 50 mg p.o. nightly and reassess in 4 weeks. No visits with results within 1 Week(s) from this visit.  Latest known visit with results is:  Abstract on 05/10/2020  Component Date Value Ref Range Status  . HM Diabetic Eye Exam 04/23/2020 No Retinopathy  No Retinopathy Final   Please see lab work above.  Patient's hemoglobin A1c was uncontrolled at 14.  I brought patient back today to discuss treatment options.  Spent more than 25 minutes today with the patient explaining a low carbohydrate diet, how to carb count, and what foods to avoid.  We also discussed treatment options including insulin versus oral hypoglycemic agents.  Patient elects to try pills and work on his diet.  Therefore I recommended we begin glipizide extended release 10 mg a day in addition to Metformin and gradually uptitrate Metformin to 1000 mg twice a day.  Patient agrees to do this.  We also discussed how  to check his blood sugar.  Of note his liver function tests were extremely high.  I discussed obtaining a right upper quadrant ultrasound with the patient however the patient reports that he is drinking a large quantity of alcohol every day.  He drinks more than a bottle of wine a day.  He denies any withdrawal symptoms when he does not drink alcohol.  Specifically he denies any resting tremor in his hands.  However he prefers to discontinue alcohol prior to obtaining any ultrasound of liver.  At that time (11/06/19) my plan was: Spent  more than 25 minutes today with the patient discussing his options.  Recommended a low carbohydrate diet.  Recommend weight loss.  Recommended cessation of all alcohol and sedatives.  Start glipizide extended release 10 mg daily.  Add Metformin 500 mg a day and gradually uptitrate to about milligrams twice a day to avoid diarrhea.  Recheck fasting blood sugars and 2-hour postprandial sugars 1 month.  Recommended that he discontinue all alcohol.  Offered the patient a right upper quadrant ultrasound which he declined.  Discontinue all alcohol and recheck CMP in 2 weeks.  If liver function tests remain persistently elevated at that point we will obtain a right upper quadrant ultrasound.  11/20/19 Patient has entirely stopped drinking.  He is up to 500 mg twice a day Metformin and glipizide extended release 10 mg a day.  His fasting blood sugars in the morning between 130 and 145.  2-hour postprandial sugars are all less than 100.  I am very happy with his blood sugars thus far.  However the patient is here today primarily to recheck his liver function test they were significantly elevated.  He attributed this to drinking large quantities of alcohol.  He also reports that his depression has gotten more serious.  He has anhedonia.  He finds no pleasure in his daily activities.  He feels sad the majority of the time with little purpose or meaning.  He reports low energy.  He reports low  concentration.  He is taking Zoloft 50 mg a day.  At that time, my plan was: Recheck a CMP today.  If liver function tests are still elevated we will proceed with a right upper quadrant ultrasound and viral hepatitis serologies.  Increase Zoloft to 100 mg a day.  Also provided the patient contact information for a psychologist, Dr. Robley Fries and explained to the patient how to schedule the appointment.  I believe the patient would benefit from cognitive behavioral therapy.   06/03/20 Patient presents today requesting that I send a letter to his insurance for a new CPAP machine.  He states that his last sleep study was 20 to 30 years ago.  He has had 2 or 3 separate machine since that time.  He wears them every night.  He wears them for at least 6 hours every night.  He states without the CPAP machine he is unable to sleep.  However his current machine is in disrepair and he believes that he would benefit from a new device.  He recently received information from his insurance stating that he could qualify for a new machine if I would simply write a letter on his behalf.  I am happy to do that.  Unfortunately I do not have any sleep study records to send with the letter.  I explained that to the patient but he would still like to try.  He does mention that he has trouble sleeping.  He recently tried an Ambien that seemed to work well for him however he had a difficult time waking up from Ambien.  I explained Ambien can potentially be habit-forming and he would like to avoid that type of medication if possible.  He mentions that he believes he has restless leg syndrome.  Specifically his right leg will jump using his words and keep him awake at night.  He reports an unusual crawling sensation in his legs and a discomfort in his legs that improves with movement.  This also helps keep him awake.  He has never  tried any medication for restless leg syndrome.  He is overdue to monitor his diabetes.  His last A1c was  14 in January.  We have not recheck says.  He states that he is changed his diet in his lifestyle.  He stopped all alcohol.  He states that he when he checks his blood sugar is under 130.  He denies any polyuria polydipsia or blurry vision   Past Medical History:  Diagnosis Date  . CAD (coronary artery disease)   . Depression 07/1993  . Diabetes mellitus without complication (HCC)   . Hyperlipidemia 02/1996  . Hypertension pre 08/25/1996  . OSA on CPAP   . Thyroid disease 07/1993   Hypothyroidism   Past Surgical History:  Procedure Laterality Date  . APPENDECTOMY  2 YOA  . CATARACT EXTRACTION, BILATERAL    . CORONARY ARTERY BYPASS GRAFT  02/1996   X 2 EF 30%  . MI S/P Caverject Injection  02/1996  . RETINAL DETACHMENT SURGERY    . Sleep Study  01/03/2006   Severe sleep apnea - 88 events per hour )2 sats down to 50% ?  Marland Kitchen Stress Cardiolite  03/1996   EF 30%   Current Outpatient Medications on File Prior to Visit  Medication Sig Dispense Refill  . Ascorbic Acid (VITAMIN C) 1000 MG tablet Take 1,000 mg by mouth daily.    Marland Kitchen aspirin 325 MG tablet Take 325 mg by mouth daily.    . cholecalciferol (VITAMIN D) 1000 UNITS tablet Take 1,000 Units by mouth daily.    Marland Kitchen latanoprost (XALATAN) 0.005 % ophthalmic solution Place 1 drop into both eyes at bedtime.     Marland Kitchen levothyroxine (SYNTHROID) 175 MCG tablet TAKE 1 TABLET DAILY BEFORE BREAKFAST. 90 tablet 2  . lisinopril (ZESTRIL) 20 MG tablet Take 1 tablet (20 mg total) by mouth daily. 90 tablet 3  . metFORMIN (GLUCOPHAGE) 500 MG tablet Take 2 tablets (1,000 mg total) by mouth 2 (two) times daily with a meal. 360 tablet 3  . Multiple Vitamin (MULTIVITAMIN) tablet Take 1 tablet by mouth daily.    . sertraline (ZOLOFT) 100 MG tablet Take 1 tablet (100 mg total) by mouth daily. 90 tablet 2  . vitamin B-12 (CYANOCOBALAMIN) 1000 MCG tablet Take 1,000 mcg by mouth daily.    Marland Kitchen glipiZIDE (GLUCOTROL XL) 10 MG 24 hr tablet TAKE 1 TABLET BY MOUTH EVERY DAY  WITH BREAKFAST (Patient not taking: Reported on 06/03/2020) 90 tablet 1  . venlafaxine XR (EFFEXOR-XR) 75 MG 24 hr capsule TAKE 2 CAPSULES BY MOUTH EVERY DAY WITH BREAKFAST (Patient not taking: Reported on 06/03/2020) 180 capsule 1   No current facility-administered medications on file prior to visit.   Allergies  Allergen Reactions  . Caverject [Alprostadil]     MI after injection   Social History   Socioeconomic History  . Marital status: Married    Spouse name: Not on file  . Number of children: 0  . Years of education: Not on file  . Highest education level: Not on file  Occupational History  . 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 Smoking status: Former Smoker    Packs/day: 2.00    Years: 15.00    Pack years: 30.00    Types: Cigarettes  . Smokeless tobacco: Never Used  . Tobacco comment: Quit 1983  Substance and Sexual Activity  . Alcohol use: Yes    Comment: case  of beer   . Drug use: Not on file  . Sexual activity: Not on file  Other Topics Concern  . Not on file  Social History Narrative   Re-married, lives with wife   2 step-children, 2 granddaughters   Working at [employer] in [employer], Materials engineer   Social Determinants of Health   Financial Resource Strain:   . Difficulty of Paying Living Expenses:   Food Insecurity:   . Worried About Programme researcher, broadcasting/film/video in the Last Year:   . Barista in the Last Year:   Transportation Needs:   . Freight forwarder (Medical):   Marland Kitchen Lack of Transportation (Non-Medical):   Physical Activity:   . Days of Exercise per Week:   . Minutes of Exercise per Session:   Stress:   . Feeling of Stress :   Social Connections:   . Frequency of Communication with Friends and Family:   . Frequency of Social Gatherings with Friends and Family:   . Attends Religious Services:   . Active Member of Clubs or Organizations:   .  Attends Banker Meetings:   Marland Kitchen Marital Status:   Intimate Partner Violence:   . Fear of Current or Ex-Partner:   . Emotionally Abused:   Marland Kitchen Physically Abused:   . Sexually Abused:    Family History  Problem Relation Age of Onset  . Hypertension Mother   . Parkinsonism Mother   . Dementia Mother   . Diabetes Mother   . Stroke Father 65  . Depression Sister   . Anxiety disorder Sister   . Heart disease Brother 40       CAD  . Hyperlipidemia Brother   . Hypertension Brother   . Arthritis Neg Hx   . Cancer Neg Hx   . Alcohol abuse Neg Hx   . Drug abuse Neg Hx   . Colon cancer Neg Hx   . Prostate cancer Neg Hx   . Heart disease Brother 32       CAD      Review of Systems  All other systems reviewed and are negative.      Objective:   Physical Exam Vitals reviewed.  Constitutional:      General: He is not in acute distress.    Appearance: He is well-developed. He is not diaphoretic.  HENT:     Head: Normocephalic and atraumatic.     Right Ear: External ear normal.     Left Ear: External ear normal.     Nose: Nose normal.     Mouth/Throat:     Pharynx: No oropharyngeal exudate.  Eyes:     General: No scleral icterus.    Conjunctiva/sclera: Conjunctivae normal.     Pupils: Pupils are equal, round, and reactive to light.  Neck:     Thyroid: No thyromegaly.     Vascular: No JVD.  Cardiovascular:     Rate and Rhythm: Normal rate and regular rhythm.     Heart sounds: Normal heart sounds. No murmur heard.   Pulmonary:     Effort: Pulmonary effort is normal. No respiratory distress.     Breath sounds: Normal breath sounds. No wheezing or rales.  Abdominal:     General: Bowel sounds are normal. There is no distension.     Palpations: Abdomen is soft.     Tenderness: There is no abdominal tenderness. There is no guarding or rebound.  Musculoskeletal:     Cervical back:  Neck supple.  Lymphadenopathy:     Cervical: No cervical adenopathy.    Neurological:     Mental Status: He is alert and oriented to person, place, and time.     Cranial Nerves: No cranial nerve deficit.     Motor: No abnormal muscle tone.     Coordination: Coordination normal.     Deep Tendon Reflexes: Reflexes are normal and symmetric.          Assessment & Plan:  Type 2 diabetes mellitus without complication, without long-term current use of insulin (HCC) - Plan: CBC with Differential/Platelet, COMPLETE METABOLIC PANEL WITH GFR, Lipid panel, Hemoglobin A1c, Microalbumin, urine  Hypothyroidism, unspecified type - Plan: TSH  Elevated liver function tests - Plan: COMPLETE METABOLIC PANEL WITH GFR  OSA on CPAP  I would like to check a CBC, CMP, fasting lipid panel, and A1c today to monitor his diabetes.  Goal hemoglobin A1c is less than 7.  Goal LDL cholesterol is less than 100.  I would like the patient to be on a statin given that he is a diabetic however he is hesitant to take any additional medication and states that his muscles already hurt him on a daily basis specifically his shoulders and hips.  He is also due to recheck a TSH.  His last TSH in January was 0.33 and indicated supratherapeutic levels of levothyroxine.  We are due to recheck that as well.  I will recheck his liver function test.  He has stopped drinking altogether and therefore hopefully his liver function test have normalized.  I will happily write a letter on his behalf to try to qualify for a new CPAP machine.  I do not have any old sleep study records to send with a letter.  Therefore I did explain to the patient that he may have to have a new sleep study in order to qualify for a new machine.  It depends entirely on what his insurance requires.

## 2020-06-07 ENCOUNTER — Other Ambulatory Visit: Payer: Self-pay | Admitting: Family Medicine

## 2020-06-10 ENCOUNTER — Other Ambulatory Visit: Payer: Medicare Other

## 2020-06-10 ENCOUNTER — Other Ambulatory Visit: Payer: Self-pay

## 2020-06-10 DIAGNOSIS — E119 Type 2 diabetes mellitus without complications: Secondary | ICD-10-CM | POA: Diagnosis not present

## 2020-06-10 DIAGNOSIS — E039 Hypothyroidism, unspecified: Secondary | ICD-10-CM | POA: Diagnosis not present

## 2020-06-10 DIAGNOSIS — R7989 Other specified abnormal findings of blood chemistry: Secondary | ICD-10-CM | POA: Diagnosis not present

## 2020-06-11 ENCOUNTER — Encounter: Payer: Self-pay | Admitting: Family Medicine

## 2020-06-11 ENCOUNTER — Other Ambulatory Visit: Payer: Self-pay

## 2020-06-11 DIAGNOSIS — E039 Hypothyroidism, unspecified: Secondary | ICD-10-CM

## 2020-06-11 LAB — COMPLETE METABOLIC PANEL WITH GFR
AG Ratio: 1.2 (calc) (ref 1.0–2.5)
ALT: 28 U/L (ref 9–46)
AST: 25 U/L (ref 10–35)
Albumin: 4.1 g/dL (ref 3.6–5.1)
Alkaline phosphatase (APISO): 107 U/L (ref 35–144)
BUN: 23 mg/dL (ref 7–25)
CO2: 25 mmol/L (ref 20–32)
Calcium: 9.6 mg/dL (ref 8.6–10.3)
Chloride: 100 mmol/L (ref 98–110)
Creat: 0.99 mg/dL (ref 0.70–1.18)
GFR, Est African American: 89 mL/min/{1.73_m2} (ref >=60)
GFR, Est Non African American: 77 mL/min/{1.73_m2} (ref >=60)
Globulin: 3.4 g/dL (calc) (ref 1.9–3.7)
Glucose, Bld: 128 mg/dL — ABNORMAL HIGH (ref 65–99)
Potassium: 4.5 mmol/L (ref 3.5–5.3)
Sodium: 135 mmol/L (ref 135–146)
Total Bilirubin: 0.3 mg/dL (ref 0.2–1.2)
Total Protein: 7.5 g/dL (ref 6.1–8.1)

## 2020-06-11 LAB — CBC WITH DIFFERENTIAL/PLATELET
Absolute Monocytes: 1294 cells/uL — ABNORMAL HIGH (ref 200–950)
Basophils Absolute: 53 cells/uL (ref 0–200)
Basophils Relative: 0.6 %
Eosinophils Absolute: 220 cells/uL (ref 15–500)
Eosinophils Relative: 2.5 %
HCT: 42.5 % (ref 38.5–50.0)
Hemoglobin: 14.3 g/dL (ref 13.2–17.1)
Lymphs Abs: 2508 cells/uL (ref 850–3900)
MCH: 33.3 pg — ABNORMAL HIGH (ref 27.0–33.0)
MCHC: 33.6 g/dL (ref 32.0–36.0)
MCV: 98.8 fL (ref 80.0–100.0)
MPV: 10.8 fL (ref 7.5–12.5)
Monocytes Relative: 14.7 %
Neutro Abs: 4726 cells/uL (ref 1500–7800)
Neutrophils Relative %: 53.7 %
Platelets: 229 10*3/uL (ref 140–400)
RBC: 4.3 10*6/uL (ref 4.20–5.80)
RDW: 13.1 % (ref 11.0–15.0)
Total Lymphocyte: 28.5 %
WBC: 8.8 10*3/uL (ref 3.8–10.8)

## 2020-06-11 LAB — MICROALBUMIN, URINE: Microalb, Ur: 0.4 mg/dL

## 2020-06-11 LAB — LIPID PANEL
Cholesterol: 162 mg/dL (ref <200)
HDL: 47 mg/dL (ref >=40)
LDL Cholesterol (Calc): 85 mg/dL (calc)
Non-HDL Cholesterol (Calc): 115 mg/dL (calc) (ref <130)
Total CHOL/HDL Ratio: 3.4 (calc) (ref <5.0)
Triglycerides: 200 mg/dL — ABNORMAL HIGH (ref <150)

## 2020-06-11 LAB — HEMOGLOBIN A1C
Hgb A1c MFr Bld: 6.4 % of total Hgb — ABNORMAL HIGH (ref <5.7)
Mean Plasma Glucose: 137 (calc)
eAG (mmol/L): 7.6 (calc)

## 2020-06-11 LAB — TSH: TSH: 0.11 mIU/L — ABNORMAL LOW (ref 0.40–4.50)

## 2020-06-11 MED ORDER — LEVOTHYROXINE SODIUM 150 MCG PO TABS
150.0000 ug | ORAL_TABLET | Freq: Every day | ORAL | 3 refills | Status: DC
Start: 2020-06-11 — End: 2021-06-25

## 2020-06-27 ENCOUNTER — Telehealth: Payer: Self-pay | Admitting: Family Medicine

## 2020-06-27 NOTE — Progress Notes (Signed)
  Chronic Care Management   Note  06/27/2020 Name: Shaun Gomez MRN: [id number] DOB: [birth year]  Shaun Gomez is a 69 y.o. year old male who is a primary care patient of Donita Brooks, MD. I reached out to Hermenia Fiscal by phone today in response to a referral sent by Shaun Gomez's PCP, Donita Brooks, MD.   Shaun Gomez was given information about Chronic Care Management services today including:  1. CCM service includes personalized support from designated clinical staff supervised by his physician, including individualized plan of care and coordination with other care providers 2. 24/7 contact phone numbers for assistance for urgent and routine care needs. 3. Service will only be billed when office clinical staff spend 20 minutes or more in a month to coordinate care. 4. Only one practitioner may furnish and bill the service in a calendar month. 5. The patient may stop CCM services at any time (effective at the end of the month) by phone call to the office staff.   Patient agreed to services and verbal consent obtained.   Follow up plan:   Carley Perdue [employer] Scheduler

## 2020-08-14 ENCOUNTER — Telehealth: Payer: Medicare Other

## 2020-08-14 ENCOUNTER — Telehealth: Payer: Self-pay | Admitting: Family Medicine

## 2020-08-14 NOTE — Progress Notes (Signed)
°  Chronic Care Management   Outreach Note  08/14/2020 Name: Shaun Gomez MRN: [id number] DOB: 04-Feb-1951  Referred by: Donita Brooks, MD Reason for referral : No chief complaint on file.   An unsuccessful telephone outreach was attempted today. The patient was referred to the pharmacist for assistance with care management and care coordination.   Follow Up Plan:   Carley Perdue [employer] Scheduler

## 2020-08-27 ENCOUNTER — Other Ambulatory Visit: Payer: Self-pay | Admitting: *Deleted

## 2020-08-27 DIAGNOSIS — G4733 Obstructive sleep apnea (adult) (pediatric): Secondary | ICD-10-CM

## 2020-08-27 DIAGNOSIS — E039 Hypothyroidism, unspecified: Secondary | ICD-10-CM

## 2020-08-27 DIAGNOSIS — R7989 Other specified abnormal findings of blood chemistry: Secondary | ICD-10-CM

## 2020-08-27 DIAGNOSIS — IMO0002 Reserved for concepts with insufficient information to code with codable children: Secondary | ICD-10-CM

## 2020-08-27 DIAGNOSIS — E119 Type 2 diabetes mellitus without complications: Secondary | ICD-10-CM

## 2020-08-27 DIAGNOSIS — E1165 Type 2 diabetes mellitus with hyperglycemia: Secondary | ICD-10-CM

## 2020-08-27 DIAGNOSIS — E1122 Type 2 diabetes mellitus with diabetic chronic kidney disease: Secondary | ICD-10-CM

## 2020-08-27 DIAGNOSIS — F339 Major depressive disorder, recurrent, unspecified: Secondary | ICD-10-CM

## 2020-08-28 ENCOUNTER — Telehealth: Payer: Self-pay | Admitting: Family Medicine

## 2020-08-28 NOTE — Progress Notes (Signed)
  Chronic Care Management   Outreach Note  08/28/2020 Name: ANTONIOS OSTROW MRN: [id number] DOB: Feb 26, 1951  Referred by: Donita Brooks, MD Reason for referral : No chief complaint on file.   A second unsuccessful telephone outreach was attempted today. The patient was referred to pharmacist for assistance with care management and care coordination.  Follow Up Plan:   Carley Perdue [employer] Scheduler

## 2020-08-29 ENCOUNTER — Telehealth: Payer: Self-pay | Admitting: Family Medicine

## 2020-08-29 NOTE — Progress Notes (Signed)
  Chronic Care Management   Note  08/29/2020 Name: [name] MRN: [id number] DOB: May 24, 1951  Shaun Gomez is a 69 y.o. year old male who is a primary care patient of Donita Brooks, MD. I reached out to Hermenia Fiscal by phone today in response to a referral sent by Mr. Shaun Gomez's PCP, Donita Brooks, MD.   [name] was given information about Chronic Care Management services today including:  1. CCM service includes personalized support from designated clinical staff supervised by his physician, including individualized plan of care and coordination with other care providers 2. 24/7 contact phone numbers for assistance for urgent and routine care needs. 3. Service will only be billed when office clinical staff spend 20 minutes or more in a month to coordinate care. 4. Only one practitioner may furnish and bill the service in a calendar month. 5. The patient may stop CCM services at any time (effective at the end of the month) by phone call to the office staff.   Patient agreed to services and verbal consent obtained.   Follow up plan:   Carley Perdue [employer] Scheduler

## 2020-09-12 NOTE — Chronic Care Management (AMB) (Addendum)
Chronic Care Management Pharmacy  Name: Shaun Gomez  MRN: [id number] DOB: [birth year]  Chief Complaint/ HPI  Shaun Gomez,  69 y.o. , male presents for their Initial CCM visit with the clinical pharmacist In office.  PCP : Shaun Frizzle, MD  Their chronic conditions include: HTN, CAD, Hypothyroidism, DM, HLD, Depression.  Office Visits: 06/03/2020 Dennard Schaumann) - patient requesting a new CPAP machine as he wears this nightly and is unable to sleep without one.  Mentions possible restless leg syndrome and issues sleeping, does not want to take medication such as ambien.  He has improved his lifestyle tremendously since his most recent A1c of 14.  Patient does not want to be on a statin due to fears of muscle aches and pains which he reports he already has.  Consult Visit: none recent  Medications: Outpatient Encounter Medications as of 09/17/2020  Medication Sig   aspirin 325 MG tablet Take 325 mg by mouth daily.   latanoprost (XALATAN) 0.005 % ophthalmic solution Place 1 drop into both eyes at bedtime.    levothyroxine (SYNTHROID) 150 MCG tablet Take 1 tablet (150 mcg total) by mouth daily.   lisinopril (ZESTRIL) 20 MG tablet Take 1 tablet (20 mg total) by mouth daily.   metFORMIN (GLUCOPHAGE) 500 MG tablet Take 2 tablets (1,000 mg total) by mouth 2 (two) times daily with a meal.   Multiple Vitamin (MULTIVITAMIN) tablet Take 1 tablet by mouth daily.   sertraline (ZOLOFT) 100 MG tablet Take 1 tablet (100 mg total) by mouth daily.   vitamin B-12 (CYANOCOBALAMIN) 1000 MCG tablet Take 1,000 mcg by mouth daily.   [DISCONTINUED] Ascorbic Acid (VITAMIN C) 1000 MG tablet Take 1,000 mg by mouth daily.   [DISCONTINUED] cholecalciferol (VITAMIN D) 1000 UNITS tablet Take 1,000 Units by mouth daily.   [DISCONTINUED] glipiZIDE (GLUCOTROL XL) 10 MG 24 hr tablet TAKE 1 TABLET BY MOUTH EVERY DAY WITH BREAKFAST (Patient not taking: Reported on 06/03/2020)   [DISCONTINUED] venlafaxine XR  (EFFEXOR-XR) 75 MG 24 hr capsule TAKE 2 CAPSULES BY MOUTH EVERY DAY WITH BREAKFAST (Patient not taking: Reported on 06/03/2020)   No facility-administered encounter medications on file as of 09/17/2020.     Current Diagnosis/Assessment:   [employer]: Low Risk    Difficulty of Paying Living Expenses: Not very hard    Goals Addressed             This Visit's Progress    Pharmacy Care Plan:       CARE PLAN ENTRY (see longitudinal plan of care for additional care plan information)  Current Barriers:  Chronic Disease Management support, education, and care coordination needs related to Hypertension, Hyperlipidemia, Diabetes, and Hypothyroidism   Hypertension BP Readings from Last 3 Encounters:  06/03/20 130/72  11/20/19 120/70  11/06/19 (!) 144/78   Pharmacist Clinical Goal(s): Over the next 180 days, patient will work with PharmD and providers to maintain BP goal <130/80 Current regimen:  Lisinopril 62m Interventions: Reviewed most recent office BP Discussed need for regular physical activity Patient self care activities - Over the next 180 days, patient will: Check BP periodically, document, and provide at future appointments Ensure daily salt intake < 2300 mg/day Work toward regular exercise plan 3-5 times per week for at least 30 minutes  Hyperlipidemia Lab Results  Component Value Date/Time   LDLCALC 85 06/10/2020 10:27 AM   Pharmacist Clinical Goal(s): Over the next 180 days, patient will work with PharmD and providers to achieve LDL goal <  70 Current regimen:  No medications Interventions: Reviewed most recent lipid panel Discussed need for close monitoring of lipids and strict LDL goals Recommended repeat lipid panel Patient self care activities - Over the next 180 days, patient will: Continue to work on healthy lifestyle mods Repeat labwork for updated lipid to evaluate treatment necessity  Diabetes Lab Results  Component Value  Date/Time   HGBA1C 6.4 (H) 06/10/2020 10:27 AM   HGBA1C 14.0 (H) 11/03/2019 03:06 PM   Pharmacist Clinical Goal(s): Over the next 180 days, patient will work with PharmD and providers to maintain A1c goal <7% Current regimen:  Metformin 526m two tablets po bid Interventions: Reviewed most recent A1c Congratulated patient for recent lifestyle changes and A1c improvement Discussed goals for FBG Patient self care activities - Over the next 180 days, patient will: Check blood sugar a few times per week, document, and provide at future appointments Contact provider with any episodes of hypoglycemia Contact providers with consistent fasting sugars > 140  Hypothyroidism Pharmacist Clinical Goal(s) Over the next 180 days, patient will work with PharmD and providers to optimize medication and minimize symptoms of hypothyroidism. Current regimen:  Levothyroxine 1544m Interventions: Reviewed most recent TSH Recommended repeat TSH Discussed signs and symptoms of overactive thyroid Patient self care activities - Over the next 180 days, patient will: Continue to take medication as directed Report to PCP for updated labwork   Initial goal documentation         Hypertension   BP goal is:  <130/80  Office blood pressures are  BP Readings from Last 3 Encounters:  06/03/20 130/72  11/20/19 120/70  11/06/19 (!) 144/78   Patient checks BP at home infrequently Patient home BP readings are ranging:  No logs available  Patient has failed these meds in the past: none noted Patient is currently controlled on the following medications:  Lisinopril 2044mWe discussed  BP controlled on current meds Not checking BP has been controlled in the past  Patient has improved lifestyle since his A1c was so elevated in Jan Denies dizziness, headaches He stopped drinking alcohol, plans to work on diet mods next  Plan  Continue current medications     Diabetes   A1c goal <7%  Recent  Relevant Labs: Lab Results  Component Value Date/Time   HGBA1C 6.4 (H) 06/10/2020 10:27 AM   HGBA1C 14.0 (H) 11/03/2019 03:06 PM   GFR 87.98 02/13/2014 04:40 PM   GFR 96.45 12/04/2013 07:55 AM   MICROALBUR 0.4 06/10/2020 10:27 AM   MICROALBUR 1.3 10/08/2017 09:27 AM    Last diabetic Eye exam:  Lab Results  Component Value Date/Time   HMDIABEYEEXA Retinopathy (A) 04/23/2020 12:00 AM    Last diabetic Foot exam: No results found for: HMDIABFOOTEX   Checking BG:  a few times per week fasting  Recent FBG Readings: 130s-140s  Patient has failed these meds in past: none ntoed Patient is currently controlled on the following medications: Metformin 500m21mice daily  We discussed:  Congratulated him on his tremendous A1c improvement!!! Patient is no longer taking glipizide, he was experiencing low blood sugars while on this Now only on metformin and fasting blood sugars have been borderline Patient has stopped drinking completely, his A1c improved tremendously! Exercise - not much currently Diet - nothing specific, patient plans to work on this next as part of his lifestyle modification Counseled on carb limitation, contact providers if fasting blood sugars consistently > 130-140  Plan  Continue current medications Hyperlipidemia  LDL goal < 70  Last lipids Lab Results  Component Value Date   CHOL 162 06/10/2020   HDL 47 06/10/2020   LDLCALC 85 06/10/2020   TRIG 200 (H) 06/10/2020   CHOLHDL 3.4 06/10/2020   Hepatic Function Latest Ref Rng & Units 06/10/2020 11/20/2019 11/03/2019  Total Protein 6.1 - 8.1 g/dL 7.5 8.0 7.6  Albumin 3.6 - 5.1 g/dL - - -  AST 10 - 35 U/L 25 84(H) 111(H)  ALT 9 - 46 U/L 28 115(H) 139(H)  Alk Phosphatase 40 - 115 U/L - - -  Total Bilirubin 0.2 - 1.2 mg/dL 0.3 0.4 0.6  Bilirubin, Direct 0.0 - 0.3 mg/dL - - -     The ASCVD Risk score ([place]., et al., 2013) failed to calculate for the following reasons:   The patient has a prior MI or  stroke diagnosis   Patient has failed these meds in past: none noted Patient is currently controlled on the following medications:  No medication at this time  We discussed:   No statin medication He reports myalgias historically and he does not wish to have these Recommend rechecking lipid panel since lifestyle mods and determine if treatment needed Patient has CAD, counseled on importance of meeting lipid goals  Plan  Continue control with diet and exercise, work on diet mods Recheck lipid panel Hypothyroidism   Lab Results  Component Value Date/Time   TSH 0.11 (L) 06/10/2020 10:27 AM   TSH 0.33 (L) 11/03/2019 03:06 PM   FREET4 0.77 08/26/2010 08:53 AM   FREET4 0.9 07/15/2009 09:50 AM    Patient has failed these meds in past: none noted Patient is currently uncontrolled on the following medications:  Levothyroxine 150 mcg  We discussed:   Patient needs to come back in for TSH recheck No symptoms of hyperthyroidism Taking medication correctly every morning Recommend TSH recheck at f/u visit with Dr. Buelah Manis  Plan  Continue current medications  Recheck TSH at next OV CAD   Patient has failed these meds in past: none noted Patient is currently controlled on the following medications:  ASA 385m daily  We discussed:  Controlling risk factors Denies abnormal bruising or bleeding Counseled on importance of meeting LDL goal  Plan  Continue current medications Depression   Patient has failed these meds in past: none noted Patient is currently controlled on the following medications:  Sertraline 1066m We discussed:  No longer taking Effexor, tried to taper but he did not like side effects He reports sleep is often hard, sometimes going to sleep at 3 to 4 am  Right now patient prefers to deal with it rather than start a medication, recommended Trazodone trial if patient wants medication intervention  Plan  Continue to follow sleep patterns Future plans to  try trazodone if patient wishes Vaccines   Reviewed and discussed patient's vaccination history.    Immunization History  Administered Date(s) Administered   Td 07/26/1996, 02/18/2007   Tdap 08/15/2013    Plan  Recommended patient receive Shingles vaccine in office.  Medication Management   Miscellaneous medications: None OTC's: ASA 32516matient currently uses [facility].  Phone #  [phone] reports using pill box method to organize medications and promote adherence. Patient denies missed doses of medication.   ChrBeverly MilchharmD Clinical Pharmacist [phone] collaborated with the care management provider regarding care management and care coordination activities outlined in this encounter and have reviewed this  encounter including documentation in the note and care plan. I am certifying that I agree with the content of this note and encounter as supervising physician.

## 2020-09-13 ENCOUNTER — Other Ambulatory Visit: Payer: Self-pay | Admitting: *Deleted

## 2020-09-13 DIAGNOSIS — I10 Essential (primary) hypertension: Secondary | ICD-10-CM

## 2020-09-13 DIAGNOSIS — E039 Hypothyroidism, unspecified: Secondary | ICD-10-CM

## 2020-09-13 DIAGNOSIS — E785 Hyperlipidemia, unspecified: Secondary | ICD-10-CM

## 2020-09-13 DIAGNOSIS — R7989 Other specified abnormal findings of blood chemistry: Secondary | ICD-10-CM

## 2020-09-13 DIAGNOSIS — E119 Type 2 diabetes mellitus without complications: Secondary | ICD-10-CM

## 2020-09-16 ENCOUNTER — Telehealth: Payer: Self-pay | Admitting: Pharmacist

## 2020-09-16 NOTE — Progress Notes (Signed)
° ° °  Chronic Care Management Pharmacy Assistant   Name: Shaun Gomez  MRN: [id number] DOB: [birth year]  Reason for Encounter: Initial Questions   PCP : Donita Brooks, MD  Allergies:   Allergies  Allergen Reactions   Caverject [Alprostadil]     MI after injection    Medications: Outpatient Encounter Medications as of 09/16/2020  Medication Sig   Ascorbic Acid (VITAMIN C) 1000 MG tablet Take 1,000 mg by mouth daily.   aspirin 325 MG tablet Take 325 mg by mouth daily.   cholecalciferol (VITAMIN D) 1000 UNITS tablet Take 1,000 Units by mouth daily.   glipiZIDE (GLUCOTROL XL) 10 MG 24 hr tablet TAKE 1 TABLET BY MOUTH EVERY DAY WITH BREAKFAST (Patient not taking: Reported on 06/03/2020)   latanoprost (XALATAN) 0.005 % ophthalmic solution Place 1 drop into both eyes at bedtime.    levothyroxine (SYNTHROID) 150 MCG tablet Take 1 tablet (150 mcg total) by mouth daily.   lisinopril (ZESTRIL) 20 MG tablet Take 1 tablet (20 mg total) by mouth daily.   metFORMIN (GLUCOPHAGE) 500 MG tablet Take 2 tablets (1,000 mg total) by mouth 2 (two) times daily with a meal.   Multiple Vitamin (MULTIVITAMIN) tablet Take 1 tablet by mouth daily.   sertraline (ZOLOFT) 100 MG tablet Take 1 tablet (100 mg total) by mouth daily.   venlafaxine XR (EFFEXOR-XR) 75 MG 24 hr capsule TAKE 2 CAPSULES BY MOUTH EVERY DAY WITH BREAKFAST (Patient not taking: Reported on 06/03/2020)   vitamin B-12 (CYANOCOBALAMIN) 1000 MCG tablet Take 1,000 mcg by mouth daily.   No facility-administered encounter medications on file as of 09/16/2020.    Current Diagnosis: Patient Active Problem List   Diagnosis Date Noted   LLQ pain 02/13/2014   Type II or unspecified type diabetes mellitus without mention of complication, not stated as uncontrolled 12/08/2013   Pain in joint, ankle and foot 04/23/2013   OSA on CPAP    Routine general medical examination at a health care facility 03/25/2012   Depression 03/25/2012    VENTRAL HERNIA, ASYMPTOMATIC 08/13/2010   LIVER FUNCTION TESTS, ABNORMAL 11/03/2007   HYPOTHYROIDISM 02/17/2007   Hyperlipemia 02/17/2007   Essential hypertension 02/17/2007   INFARCTION, MYOCARDIAL, OLD 02/17/2007   CAD 02/17/2007    Goals Addressed   None     Chronic Care Management   Outreach Note  09/16/2020 Name: Shaun Gomez MRN: [id number] DOB: 1951-05-31  Referred by: Donita Brooks, MD Reason for referral : Chronic Care Management   A second unsuccessful telephone outreach was attempted today. The patient was referred to pharmacist for assistance with care management and care coordination.  Attempted to complete Initial Questions in preparation. Unable to make contact   Follow-Up:  Pharmacist Review   Corwin Levins, [facility] Clinical Pharmacist Assistant [phone]

## 2020-09-17 ENCOUNTER — Ambulatory Visit: Payer: Medicare Other

## 2020-09-17 ENCOUNTER — Other Ambulatory Visit: Payer: Self-pay

## 2020-09-17 DIAGNOSIS — E039 Hypothyroidism, unspecified: Secondary | ICD-10-CM

## 2020-09-17 DIAGNOSIS — E119 Type 2 diabetes mellitus without complications: Secondary | ICD-10-CM

## 2020-09-17 DIAGNOSIS — I10 Essential (primary) hypertension: Secondary | ICD-10-CM

## 2020-09-17 DIAGNOSIS — E785 Hyperlipidemia, unspecified: Secondary | ICD-10-CM

## 2020-09-17 NOTE — Patient Instructions (Addendum)
Visit Information Thank you for meeting with me today!  I look forward to working with you to help you meet all of your healthcare goals and answer any questions you may have.  Feel free to contact me anytime!  Goals Addressed            This Visit's Progress   . Pharmacy Care Plan:       CARE PLAN ENTRY (see longitudinal plan of care for additional care plan information)  Current Barriers:  . Chronic Disease Management support, education, and care coordination needs related to Hypertension, Hyperlipidemia, Diabetes, and Hypothyroidism   Hypertension BP Readings from Last 3 Encounters:  06/03/20 130/72  11/20/19 120/70  11/06/19 (!) 144/78   . Pharmacist Clinical Goal(s): o Over the next 180 days, patient will work with PharmD and providers to maintain BP goal <130/80 . Current regimen:  o Lisinopril 20mg  . Interventions: o Reviewed most recent office BP o Discussed need for regular physical activity . Patient self care activities - Over the next 180 days, patient will: o Check BP periodically, document, and provide at future appointments o Ensure daily salt intake < 2300 mg/day o Work toward regular exercise plan 3-5 times per week for at least 30 minutes  Hyperlipidemia Lab Results  Component Value Date/Time   LDLCALC 85 06/10/2020 10:27 AM   . Pharmacist Clinical Goal(s): o Over the next 180 days, patient will work with PharmD and providers to achieve LDL goal < 70 . Current regimen:  o No medications . Interventions: o Reviewed most recent lipid panel o Discussed need for close monitoring of lipids and strict LDL goals o Recommended repeat lipid panel . Patient self care activities - Over the next 180 days, patient will: o Continue to work on healthy lifestyle mods o Repeat labwork for updated lipid to evaluate treatment necessity  Diabetes Lab Results  Component Value Date/Time   HGBA1C 6.4 (H) 06/10/2020 10:27 AM   HGBA1C 14.0 (H) 11/03/2019 03:06 PM    . Pharmacist Clinical Goal(s): o Over the next 180 days, patient will work with PharmD and providers to maintain A1c goal <7% . Current regimen:  o Metformin 500mg  two tablets po bid . Interventions: o Reviewed most recent A1c o Congratulated patient for recent lifestyle changes and A1c improvement o Discussed goals for FBG . Patient self care activities - Over the next 180 days, patient will: o Check blood sugar a few times per week, document, and provide at future appointments o Contact provider with any episodes of hypoglycemia o Contact providers with consistent fasting sugars > 140  Hypothyroidism . Pharmacist Clinical Goal(s) o Over the next 180 days, patient will work with PharmD and providers to optimize medication and minimize symptoms of hypothyroidism. . Current regimen:  o Levothyroxine 01/01/2020 . Interventions: o Reviewed most recent TSH o Recommended repeat TSH o Discussed signs and symptoms of overactive thyroid . Patient self care activities - Over the next 180 days, patient will: o Continue to take medication as directed o Report to PCP for updated labwork   Initial goal documentation        Mr. Supple was given information about Chronic Care Management services today including:  1. CCM service includes personalized support from designated clinical staff supervised by his physician, including individualized plan of care and coordination with other care providers 2. 24/7 contact phone numbers for assistance for urgent and routine care needs. 3. Standard insurance, coinsurance, copays and deductibles apply for chronic care management only  during months in which we provide at least 20 minutes of these services. Most insurances cover these services at 100%, however patients may be responsible for any copay, coinsurance and/or deductible if applicable. This service may help you avoid the need for more expensive face-to-face services. 4. Only one practitioner may  furnish and bill the service in a calendar month. 5. The patient may stop CCM services at any time (effective at the end of the month) by phone call to the office staff.  Patient agreed to services and verbal consent obtained.   The patient verbalized understanding of instructions, educational materials, and care plan provided today and agreed to receive a mailed copy of patient instructions, educational materials, and care plan.  Telephone follow up appointment with pharmacy team member scheduled for: 6 months  Willa Frater, PharmD Clinical Pharmacist [facility] [phone]   Diabetes Mellitus and Nutrition, Adult When you have diabetes (diabetes mellitus), it is very important to have healthy eating habits because your blood sugar (glucose) levels are greatly affected by what you eat and drink. Eating healthy foods in the appropriate amounts, at about the same times every day, can help you:  Control your blood glucose.  Lower your risk of heart disease.  Improve your blood pressure.  Reach or maintain a healthy weight. Every person with diabetes is different, and each person has different needs for a meal plan. Your health care provider may recommend that you work with a diet and nutrition specialist (dietitian) to make a meal plan that is best for you. Your meal plan may vary depending on factors such as:  The calories you need.  The medicines you take.  Your weight.  Your blood glucose, blood pressure, and cholesterol levels.  Your activity level.  Other health conditions you have, such as heart or kidney disease. How do carbohydrates affect me? Carbohydrates, also called carbs, affect your blood glucose level more than any other type of food. Eating carbs naturally raises the amount of glucose in your blood. Carb counting is a method for keeping track of how many carbs you eat. Counting carbs is important to keep your blood glucose at a healthy  level, especially if you use insulin or take certain oral diabetes medicines. It is important to know how many carbs you can safely have in each meal. This is different for every person. Your dietitian can help you calculate how many carbs you should have at each meal and for each snack. Foods that contain carbs include:  Bread, cereal, rice, pasta, and crackers.  Potatoes and corn.  Peas, beans, and lentils.  Milk and yogurt.  Fruit and juice.  Desserts, such as cakes, cookies, ice cream, and candy. How does alcohol affect me? Alcohol can cause a sudden decrease in blood glucose (hypoglycemia), especially if you use insulin or take certain oral diabetes medicines. Hypoglycemia can be a life-threatening condition. Symptoms of hypoglycemia (sleepiness, dizziness, and confusion) are similar to symptoms of having too much alcohol. If your health care provider says that alcohol is safe for you, follow these guidelines:  Limit alcohol intake to no more than 1 drink per day for nonpregnant women and 2 drinks per day for men. One drink equals 12 oz of beer, 5 oz of wine, or 1 oz of hard liquor.  Do not drink on an empty stomach.  Keep yourself hydrated with water, diet soda, or unsweetened iced tea.  Keep in mind that regular soda, juice, and other mixers  may contain a lot of sugar and must be counted as carbs. What are tips for following this plan?  Reading food labels  Start by checking the serving size on the "Nutrition Facts" label of packaged foods and drinks. The amount of calories, carbs, fats, and other nutrients listed on the label is based on one serving of the item. Many items contain more than one serving per package.  Check the total grams (g) of carbs in one serving. You can calculate the number of servings of carbs in one serving by dividing the total carbs by 15. For example, if a food has 30 g of total carbs, it would be equal to 2 servings of carbs.  Check the number of  grams (g) of saturated and trans fats in one serving. Choose foods that have low or no amount of these fats.  Check the number of milligrams (mg) of salt (sodium) in one serving. Most people should limit total sodium intake to less than 2,300 mg per day.  Always check the nutrition information of foods labeled as "low-fat" or "nonfat". These foods may be higher in added sugar or refined carbs and should be avoided.  Talk to your dietitian to identify your daily goals for nutrients listed on the label. Shopping  Avoid buying canned, premade, or processed foods. These foods tend to be high in fat, sodium, and added sugar.  Shop around the outside edge of the grocery store. This includes fresh fruits and vegetables, bulk grains, fresh meats, and fresh dairy. Cooking  Use low-heat cooking methods, such as baking, instead of high-heat cooking methods like deep frying.  Cook using healthy oils, such as olive, canola, or sunflower oil.  Avoid cooking with butter, cream, or high-fat meats. Meal planning  Eat meals and snacks regularly, preferably at the same times every day. Avoid going long periods of time without eating.  Eat foods high in fiber, such as fresh fruits, vegetables, beans, and whole grains. Talk to your dietitian about how many servings of carbs you can eat at each meal.  Eat 4-6 ounces (oz) of lean protein each day, such as lean meat, chicken, fish, eggs, or tofu. One oz of lean protein is equal to: ? 1 oz of meat, chicken, or fish. ? 1 egg. ?  cup of tofu.  Eat some foods each day that contain healthy fats, such as avocado, nuts, seeds, and fish. Lifestyle  Check your blood glucose regularly.  Exercise regularly as told by your health care provider. This may include: ? 150 minutes of moderate-intensity or vigorous-intensity exercise each week. This could be brisk walking, biking, or water aerobics. ? Stretching and doing strength exercises, such as yoga or  weightlifting, at least 2 times a week.  Take medicines as told by your health care provider.  Do not use any products that contain nicotine or tobacco, such as cigarettes and e-cigarettes. If you need help quitting, ask your health care provider.  Work with a Veterinary surgeon or diabetes educator to identify strategies to manage stress and any emotional and social challenges. Questions to ask a health care provider  Do I need to meet with a diabetes educator?  Do I need to meet with a dietitian?  What number can I call if I have questions?  When are the best times to check my blood glucose? Where to find more information:  American Diabetes Association: diabetes.org  Academy of Nutrition and Dietetics: www.eatright.[employer] of Diabetes and Digestive and Kidney  Diseases (NIH): [employer] Summary  A healthy meal plan will help you control your blood glucose and maintain a healthy lifestyle.  Working with a diet and nutrition specialist (dietitian) can help you make a meal plan that is best for you.  Keep in mind that carbohydrates (carbs) and alcohol have immediate effects on your blood glucose levels. It is important to count carbs and to use alcohol carefully. This information is not intended to replace advice given to you by your health care provider. Make sure you discuss any questions you have with your health care provider. Document Revised: 09/24/2017 Document Reviewed: 11/16/2016 Elsevier Patient Education  2020 [employer].

## 2020-09-24 ENCOUNTER — Ambulatory Visit (INDEPENDENT_AMBULATORY_CARE_PROVIDER_SITE_OTHER): Payer: Medicare Other | Admitting: Family Medicine

## 2020-09-24 ENCOUNTER — Other Ambulatory Visit: Payer: Self-pay

## 2020-09-24 VITALS — BP 140/80 | HR 78 | Temp 98.2°F | Ht 66.0 in | Wt 208.0 lb

## 2020-09-24 DIAGNOSIS — G4733 Obstructive sleep apnea (adult) (pediatric): Secondary | ICD-10-CM | POA: Diagnosis not present

## 2020-09-24 DIAGNOSIS — E039 Hypothyroidism, unspecified: Secondary | ICD-10-CM | POA: Diagnosis not present

## 2020-09-24 DIAGNOSIS — F5101 Primary insomnia: Secondary | ICD-10-CM

## 2020-09-24 DIAGNOSIS — Z9989 Dependence on other enabling machines and devices: Secondary | ICD-10-CM | POA: Diagnosis not present

## 2020-09-24 MED ORDER — TRAZODONE HCL 50 MG PO TABS
100.0000 mg | ORAL_TABLET | Freq: Every evening | ORAL | 3 refills | Status: DC | PRN
Start: 2020-09-24 — End: 2020-11-22

## 2020-09-24 NOTE — Progress Notes (Signed)
Subjective:    Patient ID: Shaun Gomez, male    DOB: 01-Jul-1951, 69 y.o.   MRN: [id number]   Patient is here today for follow-up on his obstructive sleep apnea.  Patient had a sleep study in 2007.  This showed severe sleep apnea with an AHI of 88 events per hour and 2 saturations down to 50%.  He is currently on CPAP.  He states that he wears his CPAP for at least 8 hours every night.  He feels much better when he wears a CPAP.  He is compliant and is here today just so that his insurance will continue to allow him to use the device.  However he does report insomnia.  He has been battling depression with Zoloft.  However he states at night he would lay down and his mind will just race and he will have a difficult time falling asleep.  He denies any ongoing depression or suicidal ideation or mania.  We also recently decreased his thyroid medication due to a low TSH.  He is overdue to recheck that. Past Medical History:  Diagnosis Date  . CAD (coronary artery disease)   . Depression 07/1993  . Diabetes mellitus without complication (HCC)   . Hyperlipidemia 02/1996  . Hypertension pre 08/25/1996  . OSA on CPAP   . Thyroid disease 07/1993   Hypothyroidism   Past Surgical History:  Procedure Laterality Date  . APPENDECTOMY  2 YOA  . CATARACT EXTRACTION, BILATERAL    . CORONARY ARTERY BYPASS GRAFT  02/1996   X 2 EF 30%  . MI S/P Caverject Injection  02/1996  . RETINAL DETACHMENT SURGERY    . Sleep Study  01/03/2006   Severe sleep apnea - 88 events per hour )2 sats down to 50% ?  Marland Kitchen Stress Cardiolite  03/1996   EF 30%   Current Outpatient Medications on File Prior to Visit  Medication Sig Dispense Refill  . aspirin 325 MG tablet Take 325 mg by mouth daily.    Marland Kitchen latanoprost (XALATAN) 0.005 % ophthalmic solution Place 1 drop into both eyes at bedtime.     Marland Kitchen levothyroxine (SYNTHROID) 150 MCG tablet Take 1 tablet (150 mcg total) by mouth daily. 90 tablet 3  . lisinopril (ZESTRIL) 20 MG  tablet Take 1 tablet (20 mg total) by mouth daily. 90 tablet 3  . metFORMIN (GLUCOPHAGE) 500 MG tablet Take 2 tablets (1,000 mg total) by mouth 2 (two) times daily with a meal. 360 tablet 3  . sertraline (ZOLOFT) 100 MG tablet Take 1 tablet (100 mg total) by mouth daily. 90 tablet 2  . Multiple Vitamin (MULTIVITAMIN) tablet Take 1 tablet by mouth daily. (Patient not taking: Reported on 09/24/2020)    . vitamin B-12 (CYANOCOBALAMIN) 1000 MCG tablet Take 1,000 mcg by mouth daily. (Patient not taking: Reported on 09/24/2020)     No current facility-administered medications on file prior to visit.   Allergies  Allergen Reactions  . Caverject [Alprostadil]     MI after injection   Social History   Socioeconomic History  . Marital status: Married    Spouse name: Not on file  . Number of children: 0  . Years of education: Not on file  . Highest education level: Not on file  Occupational History  . 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 Smoking status: Former Smoker    Packs/day: 2.00  Years: 15.00    Pack years: 30.00    Types: Cigarettes  . Smokeless tobacco: Never Used  . Tobacco comment: Quit 1983  Substance and Sexual Activity  . Alcohol use: Yes    Comment: case of beer   . Drug use: Not on file  . Sexual activity: Not on file  Other Topics Concern  . Not on file  Social History Narrative   Re-married, lives with wife   2 step-children, 2 granddaughters   Working at [employer] in [employer], Materials engineer   Social Determinants of Health   Financial Resource Strain: Low Risk   . Difficulty of Paying Living Expenses: Not very hard  Food Insecurity:   . Worried About Programme researcher, broadcasting/film/video in the Last Year: Not on file  . Ran Out of Food in the Last Year: Not on file  Transportation Needs:   . Lack of Transportation (Medical): Not on file  . Lack of Transportation  (Non-Medical): Not on file  Physical Activity:   . Days of Exercise per Week: Not on file  . Minutes of Exercise per Session: Not on file  Stress:   . Feeling of Stress : Not on file  Social Connections:   . Frequency of Communication with Friends and Family: Not on file  . Frequency of Social Gatherings with Friends and Family: Not on file  . Attends Religious Services: Not on file  . Active Member of Clubs or Organizations: Not on file  . Attends Banker Meetings: Not on file  . Marital Status: Not on file  Intimate Partner Violence:   . Fear of Current or Ex-Partner: Not on file  . Emotionally Abused: Not on file  . Physically Abused: Not on file  . Sexually Abused: Not on file   Family History  Problem Relation Age of Onset  . Hypertension Mother   . Parkinsonism Mother   . Dementia Mother   . Diabetes Mother   . Stroke Father 22  . Depression Sister   . Anxiety disorder Sister   . Heart disease Brother 72       CAD  . Hyperlipidemia Brother   . Hypertension Brother   . Arthritis Neg Hx   . Cancer Neg Hx   . Alcohol abuse Neg Hx   . Drug abuse Neg Hx   . Colon cancer Neg Hx   . Prostate cancer Neg Hx   . Heart disease Brother 47       CAD      Review of Systems  All other systems reviewed and are negative.      Objective:   Physical Exam Vitals reviewed.  Constitutional:      General: He is not in acute distress.    Appearance: He is well-developed. He is not diaphoretic.  HENT:     Head: Normocephalic and atraumatic.     Right Ear: External ear normal.     Left Ear: External ear normal.     Nose: Nose normal.     Mouth/Throat:     Pharynx: No oropharyngeal exudate.  Eyes:     General: No scleral icterus.    Conjunctiva/sclera: Conjunctivae normal.     Pupils: Pupils are equal, round, and reactive to light.  Neck:     Thyroid: No thyromegaly.     Vascular: No JVD.  Cardiovascular:     Rate and Rhythm: Normal rate and regular  rhythm.     Heart sounds: Normal heart  sounds. No murmur heard.   Pulmonary:     Effort: Pulmonary effort is normal. No respiratory distress.     Breath sounds: Normal breath sounds. No wheezing or rales.  Abdominal:     General: Bowel sounds are normal. There is no distension.     Palpations: Abdomen is soft.     Tenderness: There is no abdominal tenderness. There is no guarding or rebound.  Musculoskeletal:     Cervical back: Neck supple.  Lymphadenopathy:     Cervical: No cervical adenopathy.  Neurological:     Mental Status: He is alert and oriented to person, place, and time.     Cranial Nerves: No cranial nerve deficit.     Motor: No abnormal muscle tone.     Coordination: Coordination normal.     Deep Tendon Reflexes: Reflexes are normal and symmetric.          Assessment & Plan:  Hypothyroidism, unspecified type - Plan: TSH  OSA on CPAP  Primary insomnia  Patient is compliant with his treatment for obstructive sleep apnea using his CPAP device.  We will try treating his insomnia with trazodone 50 mg p.o. nightly.  He can decrease it to 25 if it is too strong or he can increase it to 100 mg at night if it is not strong enough after a few days on the medication.  If this is not working we can try switching to Ambien.  We also discussed proper sleep hygiene.  While the patient is here I will also check a TSH to ensure adequate dosage of his levothyroxine.

## 2020-09-25 LAB — TSH: TSH: 4.75 mIU/L — ABNORMAL HIGH (ref 0.40–4.50)

## 2020-09-30 DIAGNOSIS — M25552 Pain in left hip: Secondary | ICD-10-CM | POA: Diagnosis not present

## 2020-09-30 DIAGNOSIS — M545 Low back pain, unspecified: Secondary | ICD-10-CM | POA: Diagnosis not present

## 2020-09-30 DIAGNOSIS — M25551 Pain in right hip: Secondary | ICD-10-CM | POA: Diagnosis not present

## 2020-10-11 ENCOUNTER — Telehealth: Payer: Self-pay

## 2020-10-11 NOTE — Telephone Encounter (Signed)
Called patient back from vm he left.

## 2020-10-11 NOTE — Telephone Encounter (Signed)
Shaun Gomez called regarding his lab results, explained to him that Provider is going to leave medication where its at, and rather than chasing it. Shaun Gomez wanted a better understanding if last time it was up and now its the same and Provider is going to leave his medication were its at. I insisted he come in to discuss it with his Provider, he then decline.

## 2020-10-11 NOTE — Telephone Encounter (Signed)
Spoke with patient about lab results also sent message to his Provider

## 2020-10-17 ENCOUNTER — Other Ambulatory Visit: Payer: Self-pay | Admitting: Family Medicine

## 2020-10-21 ENCOUNTER — Telehealth: Payer: Self-pay | Admitting: Pharmacist

## 2020-10-21 NOTE — Chronic Care Management (AMB) (Addendum)
Chronic Care Management Pharmacy Assistant   Name: Shaun Gomez  MRN: [id number] DOB: 05/17/1951  Reason for Encounter: Disease State for DM and HLD  Patient Questions:  1.  Have you seen any other providers since your last visit? Yes.   2.  Any changes in your medicines or health? Yes   PCP : Donita Brooks, MD   Their chronic conditions include: HTN, CAD, Hypothyroidism, DM, HLD, Depression.  Office Visits: 09/24/20 with Dr.Pickard for insomnia. Increased Trazodone to 50 mg at bedtime to improve sleep. Per Dr.Pickard patient can decrease if its too strong or increase it to 100 mg if its not strong enough.   Consults: None since last visit on 09/16/20.  Allergies:   Allergies  Allergen Reactions   Caverject [Alprostadil]     MI after injection    Medications: Outpatient Encounter Medications as of 10/21/2020  Medication Sig   aspirin 325 MG tablet Take 325 mg by mouth daily.   latanoprost (XALATAN) 0.005 % ophthalmic solution Place 1 drop into both eyes at bedtime.    levothyroxine (SYNTHROID) 150 MCG tablet Take 1 tablet (150 mcg total) by mouth daily.   lisinopril (ZESTRIL) 20 MG tablet Take 1 tablet (20 mg total) by mouth daily.   metFORMIN (GLUCOPHAGE) 500 MG tablet Take 2 tablets (1,000 mg total) by mouth 2 (two) times daily with a meal.   Multiple Vitamin (MULTIVITAMIN) tablet Take 1 tablet by mouth daily. (Patient not taking: Reported on 09/24/2020)   sertraline (ZOLOFT) 100 MG tablet Take 1 tablet (100 mg total) by mouth daily.   traZODone (DESYREL) 50 MG tablet Take 2 tablets (100 mg total) by mouth at bedtime as needed for sleep.   vitamin B-12 (CYANOCOBALAMIN) 1000 MCG tablet Take 1,000 mcg by mouth daily. (Patient not taking: Reported on 09/24/2020)   No facility-administered encounter medications on file as of 10/21/2020.    Current Diagnosis: Patient Active Problem List   Diagnosis Date Noted   LLQ pain 02/13/2014   Type II or unspecified type  diabetes mellitus without mention of complication, not stated as uncontrolled 12/08/2013   Pain in joint, ankle and foot 04/23/2013   OSA on CPAP    Routine general medical examination at a health care facility 03/25/2012   Depression 03/25/2012   VENTRAL HERNIA, ASYMPTOMATIC 08/13/2010   LIVER FUNCTION TESTS, ABNORMAL 11/03/2007   Hypothyroidism 02/17/2007   Hyperlipemia 02/17/2007   Essential hypertension 02/17/2007   INFARCTION, MYOCARDIAL, OLD 02/17/2007   CAD 02/17/2007    Goals Addressed   None     Recent Relevant Labs: Lab Results  Component Value Date/Time   HGBA1C 6.4 (H) 06/10/2020 10:27 AM   HGBA1C 14.0 (H) 11/03/2019 03:06 PM   MICROALBUR 0.4 06/10/2020 10:27 AM   MICROALBUR 1.3 10/08/2017 09:27 AM    Kidney Function Lab Results  Component Value Date/Time   CREATININE 0.99 06/10/2020 10:27 AM   CREATININE 1.05 11/20/2019 11:12 AM   GFR 87.98 02/13/2014 04:40 PM   GFRNONAA 77 06/10/2020 10:27 AM   GFRAA 89 06/10/2020 10:27 AM    Current antihyperglycemic regimen:  Metformin 500mg  two tablets po bid  What recent interventions/DTPs have been made to improve glycemic control:  None Have there been any recent hospitalizations or ED visits since last visit with CPP? No   Patient denies hypoglycemic symptoms, including None   Patient denies hyperglycemic symptoms, including none   How often are you checking your blood sugar? Patient is not currently checking his  blood sugar.  What are your blood sugars ranging?  Fasting: N/A Before meals: N/A After meals: N/A Bedtime: N/A  During the week, how often does your blood glucose drop below 70? Patient is not currently checking his blood sugar.  Are you checking your feet daily/regularly?   Patient does not check daily but he stated he does not have any open sores or concerns about his feet at this time.  Adherence Review: Is the patient currently on a STATIN medication? No. Is the patient currently on  ACE/ARB medication? Yes, Lisinopril 20 mg  Does the patient have >5 day gap between last estimated fill dates? No. CPP please check.   Lipid Panel    Component Value Date/Time   CHOL 162 06/10/2020 1027   TRIG 200 (H) 06/10/2020 1027   HDL 47 06/10/2020 1027   LDLCALC 85 06/10/2020 1027    10-year ASCVD risk score: The ASCVD Risk score Denman George DC Jr., et al., 2013) failed to calculate for the following reasons:   The patient has a prior MI or stroke diagnosis  Current antihyperlipidemic regimen:  None  Previous antihyperlipidemic medications tried: None  ASCVD risk enhancing conditions: age >12, DM and HTN   What recent interventions/DTPs have been made by any provider to improve Cholesterol control since last CPP Visit: None  Any recent hospitalizations or ED visits since last visit with CPP? No   What diet changes have been made to improve Cholesterol?  Patient stated he is not on any diet at this time but he does try to eat well.  What exercise is being done to improve Cholesterol?  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  Adherence Review: Does the patient have >5 day gap between last estimated fill dates? No, CPP Please Check.  Patient stated he recently received a CPAP machine and its causes irritation and burning sensation around the nasal cavity. He stated sometimes its so bad he has to remove the CPAP machine throughout the night interrupting his sleep. He stated he's already spoke with the manufactures and it wasn't much help.    Follow-Up:  Pharmacist Review   Hulen Luster, RMA Clinical Pharmacst Assistant [phone]  7 minutes spent in review, coordination, and documentation.  Reviewed by: Willa Frater, PharmD Clinical Pharmacist [facility] [phone]

## 2020-10-23 ENCOUNTER — Other Ambulatory Visit: Payer: Self-pay | Admitting: Family Medicine

## 2020-11-05 DIAGNOSIS — H401112 Primary open-angle glaucoma, right eye, moderate stage: Secondary | ICD-10-CM | POA: Diagnosis not present

## 2020-11-22 ENCOUNTER — Other Ambulatory Visit: Payer: Self-pay | Admitting: Family Medicine

## 2020-11-22 NOTE — Telephone Encounter (Signed)
Called pt to inform of refill he has on 11/24/20, left message

## 2020-12-13 DIAGNOSIS — H401112 Primary open-angle glaucoma, right eye, moderate stage: Secondary | ICD-10-CM | POA: Diagnosis not present

## 2020-12-13 DIAGNOSIS — H401122 Primary open-angle glaucoma, left eye, moderate stage: Secondary | ICD-10-CM | POA: Diagnosis not present

## 2020-12-13 NOTE — Progress Notes (Signed)
Chronic Care Management Pharmacy Note  12/20/2020 Name:  Shaun Gomez MRN:  [id number] DOB:  April 22, 1951  Subjective: Shaun Gomez is an 70 y.o. year old male who is a primary patient of Pickard, Cammie Mcgee, MD.  The CCM team was consulted for assistance with disease management and care coordination needs.    Engaged with patient by telephone for follow up visit in response to provider referral for pharmacy case management and/or care coordination services.   Consent to Services:  The patient was given the following information about Chronic Care Management services today, agreed to services, and gave verbal consent: 1. CCM service includes personalized support from designated clinical staff supervised by the primary care provider, including individualized plan of care and coordination with other care providers 2. 24/7 contact phone numbers for assistance for urgent and routine care needs. 3. Service will only be billed when office clinical staff spend 20 minutes or more in a month to coordinate care. 4. Only one practitioner may furnish and bill the service in a calendar month. 5.The patient may stop CCM services at any time (effective at the end of the month) by phone call to the office staff. 6. The patient will be responsible for cost sharing (co-pay) of up to 20% of the service fee (after annual deductible is met). Patient agreed to services and consent obtained.  Patient Care Team: Susy Frizzle, MD as PCP - General (Family Medicine) Edythe Clarity, [facility] as Pharmacist (Pharmacist)  Recent office visits: 11/30/521 Dennard Schaumann) - given trazodone for sleep, also TSH ordered and was slightly high - no change to medication dose  Recent consult visits: None since last CCM visit  Hospital visits: None in previous 6 months  Objective:  Lab Results  Component Value Date   CREATININE 0.99 06/10/2020   BUN 23 06/10/2020   GFR 87.98 02/13/2014   GFRNONAA 77 06/10/2020   GFRAA 89  06/10/2020   NA 135 06/10/2020   K 4.5 06/10/2020   CALCIUM 9.6 06/10/2020   CO2 25 06/10/2020    Lab Results  Component Value Date/Time   HGBA1C 6.4 (H) 06/10/2020 10:27 AM   HGBA1C 14.0 (H) 11/03/2019 03:06 PM   GFR 87.98 02/13/2014 04:40 PM   GFR 96.45 12/04/2013 07:55 AM   MICROALBUR 0.4 06/10/2020 10:27 AM   MICROALBUR 1.3 10/08/2017 09:27 AM    Last diabetic Eye exam:  Lab Results  Component Value Date/Time   HMDIABEYEEXA Retinopathy (A) 04/23/2020 12:00 AM    Last diabetic Foot exam: No results found for: HMDIABFOOTEX   Lab Results  Component Value Date   CHOL 162 06/10/2020   HDL 47 06/10/2020   LDLCALC 85 06/10/2020   TRIG 200 (H) 06/10/2020   CHOLHDL 3.4 06/10/2020    Hepatic Function Latest Ref Rng & Units 06/10/2020 11/20/2019 11/03/2019  Total Protein 6.1 - 8.1 g/dL 7.5 8.0 7.6  Albumin 3.6 - 5.1 g/dL - - -  AST 10 - 35 U/L 25 84(H) 111(H)  ALT 9 - 46 U/L 28 115(H) 139(H)  Alk Phosphatase 40 - 115 U/L - - -  Total Bilirubin 0.2 - 1.2 mg/dL 0.3 0.4 0.6  Bilirubin, Direct 0.0 - 0.3 mg/dL - - -    Lab Results  Component Value Date/Time   TSH 4.75 (H) 09/24/2020 11:37 AM   TSH 0.11 (L) 06/10/2020 10:27 AM   FREET4 0.77 08/26/2010 08:53 AM   FREET4 0.9 07/15/2009 09:50 AM    CBC Latest Ref Rng &  Units 06/10/2020 11/03/2019 10/31/2018  WBC 3.8 - 10.8 Thousand/uL 8.8 9.2 9.7  Hemoglobin 13.2 - 17.1 g/dL 14.3 15.2 15.8  Hematocrit 38.5 - 50.0 % 42.5 44.4 45.9  Platelets 140 - 400 Thousand/uL 229 191 310    Lab Results  Component Value Date/Time   VD25OH 34 08/26/2010 09:39 PM   VD25OH 22 (L) 07/15/2009 09:40 PM    Clinical ASCVD: Yes  The ASCVD Risk score Mikey Bussing DC Jr., et al., 2013) failed to calculate for the following reasons:   The patient has a prior MI or stroke diagnosis    Depression screen [facility] 2/9 11/18/2018 10/08/2017  Decreased Interest 2 1  Down, Depressed, Hopeless 1 1  PHQ - 2 Score 3 2  Altered sleeping 2 1  Tired, decreased energy 3 1   Change in appetite 0 1  Feeling bad or failure about yourself  1 1  Trouble concentrating 0 0  Moving slowly or fidgety/restless 0 0  Suicidal thoughts 0 0  PHQ-9 Score 9 6  Difficult doing work/chores Somewhat difficult Not difficult at all      Social History   Tobacco Use  Smoking Status Former Smoker  . Packs/day: 2.00  . Years: 15.00  . Pack years: 30.00  . Types: Cigarettes  Smokeless Tobacco Never Used  Tobacco Comment   Quit 1983   BP Readings from Last 3 Encounters:  09/24/20 140/80  06/03/20 130/72  11/20/19 120/70   Pulse Readings from Last 3 Encounters:  09/24/20 78  06/03/20 87  11/20/19 90   Wt Readings from Last 3 Encounters:  09/24/20 208 lb (94.3 kg)  06/03/20 192 lb (87.1 kg)  11/20/19 186 lb (84.4 kg)    Assessment/Interventions: Review of patient past medical history, allergies, medications, health status, including review of consultants reports, laboratory and other test data, was performed as part of comprehensive evaluation and provision of chronic care management services.   SDOH:  (Social Determinants of Health) assessments and interventions performed: No   CCM Care Plan  Allergies  Allergen Reactions  . Caverject [Alprostadil]     MI after injection    Medications Reviewed Today    Reviewed by Edythe Clarity, [facility] (Pharmacist) on 12/20/20 at 1206  Med List Status: <None>  Medication Order Taking? Sig Documenting Provider Last Dose Status Informant  aspirin 325 MG tablet [id number] Yes Take 325 mg by mouth daily. [provider] Taking Active   latanoprost (XALATAN) 0.005 % ophthalmic solution [id number] Yes Place 1 drop into both eyes at bedtime.  [provider] Taking Active   levothyroxine (SYNTHROID) 150 MCG tablet [id number] Yes Take 1 tablet (150 mcg total) by mouth daily. Susy Frizzle, MD Taking Active   lisinopril (ZESTRIL) 20 MG tablet [id number] Yes Take 1 tablet (20 mg total) by mouth daily. Susy Frizzle, MD Taking Active   metFORMIN (GLUCOPHAGE) 500 MG tablet [id number] Yes Take 2 tablets (1,000 mg total) by mouth 2 (two) times daily with a meal. Susy Frizzle, MD Taking Active   Multiple Vitamin (MULTIVITAMIN) tablet [id number] Yes Take 1 tablet by mouth daily. [provider] Taking Active   sertraline (ZOLOFT) 100 MG tablet [id number] Yes TAKE 1 TABLET DAILY Susy Frizzle, MD Taking Active   traZODone (DESYREL) 50 MG tablet [id number] Yes TAKE 2 TABLETS BY MOUTH AT BEDTIME AS NEEDED FOR SLEEP. Susy Frizzle, MD Taking Active   vitamin B-12 (CYANOCOBALAMIN) 1000 MCG tablet [id number] Yes Take 1,000 mcg by mouth daily.  [provider] Taking Active           Patient Active Problem List   Diagnosis Date Noted  . LLQ pain 02/13/2014  . Type II or unspecified type diabetes mellitus without mention of complication, not stated as uncontrolled 12/08/2013  . Pain in joint, ankle and foot 04/23/2013  . OSA on CPAP   . Routine general medical examination at a health care facility 03/25/2012  . Depression 03/25/2012  . VENTRAL HERNIA, ASYMPTOMATIC 08/13/2010  . LIVER FUNCTION TESTS, ABNORMAL 11/03/2007  . Hypothyroidism 02/17/2007  . Hyperlipemia 02/17/2007  . Essential hypertension 02/17/2007  . INFARCTION, MYOCARDIAL, OLD 02/17/2007  . CAD 02/17/2007    Immunization History  Administered Date(s) Administered  . Td 07/26/1996, 02/18/2007  . Tdap 08/15/2013    Conditions to be addressed/monitored:  HTN, CAD, Hypothyroidism, DM, HLD, Depression.   Care Plan : General Pharmacy (Adult)  Updates made by Edythe Clarity, RPH since 12/20/2020 12:00 AM    Problem: HTN, HLD, Depression, DM   Priority: High  Onset Date: 12/20/2020    Long-Range Goal: Patient Stated   Start Date: 12/20/2020  Expected End Date: 06/19/2021  This Visit's Progress: On track  Priority: High  Note:   Current Barriers:  . No specific barrier to medication  noted.   Pharmacist Clinical Goal(s):  Marland Kitchen Over the next 180 days, patient will maintain control of blood glucose as evidenced by home monitoring, A1c  . contact provider office for questions/concerns as evidenced notation of same in electronic health record . Contact providers to switch to XR version of metformin through collaboration with PharmD and provider.   Interventions: . 1:1 collaboration with Susy Frizzle, MD regarding development and update of comprehensive plan of care as evidenced by provider attestation and co-signature . Inter-disciplinary care team collaboration (see longitudinal plan of care) . Comprehensive medication review performed; medication list updated in electronic medical record  Hypertension (BP goal <130/80) -controlled -Current treatment: . Lisinopril 37m -Medications previously tried: none noted  -Current home readings: not checking regularly -Denies hypotensive/hypertensive symptoms -Educated on BP goals and benefits of medications for prevention of heart attack, stroke and kidney damage; Importance of home blood pressure monitoring; -Counseled to monitor BP at home periodically, document, and provide log at future appointments -Recommended to continue current medication  Hyperlipidemia: (LDL goal < 70) -uncontrolled -Current treatment: . No medications -Medications previously tried: statins (myalgias)  -Current dietary patterns: patient has made great dietary changes, no longer drinking alcohol -Current exercise habits:  -Educated on Cholesterol goals;  Importance of limiting foods high in cholesterol; -Recommended new lab workup at next phsyical  Diabetes (A1c goal <7%) -controlled -Current medications: .Marland KitchenMetformin 5041mtwo tablets twice daily -Medications previously tried: glipizide (d/c due to lifestyle changes)  -Current home glucose readings . fasting glucose: 114 . post prandial glucose:  -Denies hypoglycemic/hyperglycemic  symptoms -He does reports some GI issues (diarrhea) with metformin.  Discussed switching to ER and patient was agreeable, however he wanted to finish out his current supply before making this switch.  He will mention to Dr. PiDennard Schaumannhen he comes in for physical. -Educated onA1c and blood sugar goals; Benefits of routine self-monitoring of blood sugar; -Counseled to check feet daily and get yearly eye exams -Recommended to continue current medication Counseled on lesser GI effects seen with XR version  Depression/Anxiety (Goal: Minimize symptoms) -controlled -Current treatment: . Trazodone 506maily  Sertraline 100m45medications previously tried/failed: effexor -Reports sleep has improved since starting Trazodone. -  Discussed range of doses he could use (60m-100mg) he is currently taking 571mand controlled. -Educated on Benefits of medication for symptom control -Recommended to continue current medication   Patient Goals/Self-Care Activities . Over the next 180 days, patient will:  - take medications as prescribed check glucose daily, document, and provide at future appointments notify providers to switch to XR version of Metformin  Follow Up Plan: The care management team will reach out to the patient again over the next 180 days.         Medication Assistance: None required.  Patient affirms current coverage meets needs.  Patient's preferred pharmacy is:  [id number] [place] [place]: [phone]: [phone] [facility] [place] Portal to Registered [place]: [phone]: [phone] pill box? Yes Pt endorses 100% compliance  We discussed: Benefits of medication synchronization, packaging and delivery as well as enhanced pharmacist oversight  with [employer]. Patient decided to: Continue current medication management strategy  Care Plan and Follow Up Patient Decision:  Patient agrees to Care Plan and Follow-up.  Plan: The care management team will reach out to the patient again over the next 180 days.  ChrBeverly MilchharmD Clinical Pharmacist [phone]

## 2020-12-20 ENCOUNTER — Telehealth: Payer: Self-pay | Admitting: Pharmacist

## 2020-12-20 ENCOUNTER — Ambulatory Visit (INDEPENDENT_AMBULATORY_CARE_PROVIDER_SITE_OTHER): Payer: Medicare Other | Admitting: Pharmacist

## 2020-12-20 DIAGNOSIS — I10 Essential (primary) hypertension: Secondary | ICD-10-CM | POA: Diagnosis not present

## 2020-12-20 DIAGNOSIS — F32A Depression, unspecified: Secondary | ICD-10-CM

## 2020-12-20 DIAGNOSIS — E785 Hyperlipidemia, unspecified: Secondary | ICD-10-CM

## 2020-12-20 DIAGNOSIS — E119 Type 2 diabetes mellitus without complications: Secondary | ICD-10-CM

## 2020-12-20 NOTE — Patient Instructions (Addendum)
Visit Information  Goals Addressed            This Visit's Progress   . Manage My Medicine       Timeframe:  Long-Range Goal Priority:  High Start Date:   12/20/20                          Expected End Date: 06/19/21                      Follow Up Date 03/25/21   - call for medicine refill 2 or 3 days before it runs out - call if I am sick and can't take my medicine - use a pillbox to sort medicine    Why is this important?   . These steps will help you keep on track with your medicines.   Notes: Contact providers when you need to switch to XR metformin.      Patient Care Plan: General Pharmacy (Adult)    Problem Identified: HTN, HLD, Depression, DM   Priority: High  Onset Date: 12/20/2020    Long-Range Goal: Patient Stated   Start Date: 12/20/2020  Expected End Date: 06/19/2021  This Visit's Progress: On track  Priority: High  Note:   Current Barriers:  . No specific barrier to medication noted.   Pharmacist Clinical Goal(s):  Marland Kitchen Over the next 180 days, patient will maintain control of blood glucose as evidenced by home monitoring, A1c  . contact provider office for questions/concerns as evidenced notation of same in electronic health record . Contact providers to switch to XR version of metformin through collaboration with PharmD and provider.   Interventions: . 1:1 collaboration with Donita Brooks, MD regarding development and update of comprehensive plan of care as evidenced by provider attestation and co-signature . Inter-disciplinary care team collaboration (see longitudinal plan of care) . Comprehensive medication review performed; medication list updated in electronic medical record  Hypertension (BP goal <130/80) -controlled -Current treatment: . Lisinopril 20mg  -Medications previously tried: none noted  -Current home readings: not checking regularly -Denies hypotensive/hypertensive symptoms -Educated on BP goals and benefits of medications for  prevention of heart attack, stroke and kidney damage; Importance of home blood pressure monitoring; -Counseled to monitor BP at home periodically, document, and provide log at future appointments -Recommended to continue current medication  Hyperlipidemia: (LDL goal < 70) -uncontrolled -Current treatment: . No medications -Medications previously tried: statins (myalgias)  -Current dietary patterns: patient has made great dietary changes, no longer drinking alcohol -Current exercise habits:  -Educated on Cholesterol goals;  Importance of limiting foods high in cholesterol; -Recommended new lab workup at next phsyical  Diabetes (A1c goal <7%) -controlled -Current medications: Metformin 500mg  two tablets twice daily -Medications previously tried: glipizide (d/c due to lifestyle changes)  -Current home glucose readings . fasting glucose: 114 . post prandial glucose:  -Denies hypoglycemic/hyperglycemic symptoms -He does reports some GI issues (diarrhea) with metformin.  Discussed switching to ER and patient was agreeable, however he wanted to finish out his current supply before making this switch.  He will mention to Dr. Marland Kitchen when he comes in for physical. -Educated onA1c and blood sugar goals; Benefits of routine self-monitoring of blood sugar; -Counseled to check feet daily and get yearly eye exams -Recommended to continue current medication Counseled on lesser GI effects seen with XR version  Depression/Anxiety (Goal: Minimize symptoms) -controlled -Current treatment: . Trazodone 50mg  daily  Sertraline 100mg  -Medications previously  tried/failed: effexor -Reports sleep has improved since starting Trazodone. -Discussed range of doses he could use (25mg -100mg ) he is currently taking 50mg  and controlled. -Educated on Benefits of medication for symptom control -Recommended to continue current medication   Patient Goals/Self-Care Activities . Over the next 180 days,  patient will:  - take medications as prescribed check glucose daily, document, and provide at future appointments notify providers to switch to XR version of Metformin  Follow Up Plan: The care management team will reach out to the patient again over the next 180 days.         The patient verbalized understanding of instructions, educational materials, and care plan provided today and agreed to receive a mailed copy of patient instructions, educational materials, and care plan.  Telephone follow up appointment with pharmacy team member scheduled for: 6 months  , [facility]  Diabetes Mellitus and Exercise Exercising regularly is important for overall health, especially for people who have diabetes mellitus. Exercising is not only about losing weight. It has many other health benefits, such as increasing muscle strength and bone density and reducing body fat and stress. This leads to improved fitness, flexibility, and endurance, all of which result in better overall health. What are the benefits of exercise if I have diabetes? Exercise has many benefits for people with diabetes. They include:  Helping to lower and control blood sugar (glucose).  Helping the body to respond better to the hormone insulin by improving insulin sensitivity.  Reducing how much insulin the body needs.  Lowering the risk for heart disease by: ? Lowering "bad" cholesterol and triglyceride levels. ? Increasing "good" cholesterol levels. ? Lowering blood pressure. ? Lowering blood glucose levels. What is my activity plan? Your health care provider or certified diabetes educator can help you make a plan for the type and frequency of exercise that works for you. This is called your activity plan. Be sure to:  Get at least 150 minutes of medium-intensity or high-intensity exercise each week. Exercises may include brisk walking, biking, or water aerobics.  Do stretching and strengthening exercises, such  as yoga or weight lifting, at least 2 times a week.  Spread out your activity over at least 3 days of the week.  Get some form of physical activity each day. ? Do not go more than 2 days in a row without some kind of physical activity. ? Avoid being inactive for more than 90 minutes at a time. Take frequent breaks to walk or stretch.  Choose exercises or activities that you enjoy. Set realistic goals.  Start slowly and gradually increase your exercise intensity over time.   How do I manage my diabetes during exercise? Monitor your blood glucose  Check your blood glucose before and after exercising. If your blood glucose is: ? 240 mg/dL ( mmol/L) or higher before you exercise, check your urine for ketones. These are chemicals created by the liver. If you have ketones in your urine, do not exercise until your blood glucose returns to normal. ? 100 mg/dL (5.6 mmol/L) or lower, eat a snack containing 15-20 grams of carbohydrate. Check your blood glucose 15 minutes after the snack to make sure that your glucose level is above 100 mg/dL (5.6 mmol/L) before you start your exercise.  Know the symptoms of low blood glucose (hypoglycemia) and how to treat it. Your risk for hypoglycemia increases during and after exercise. Follow these tips and your health care provider's instructions  Keep a carbohydrate snack that is  fast-acting for use before, during, and after exercise to help prevent or treat hypoglycemia.  Avoid injecting insulin into areas of the body that are going to be exercised. For example, avoid injecting insulin into: ? Your arms, when you are about to play tennis. ? Your legs, when you are about to go jogging.  Keep records of your exercise habits. Doing this can help you and your health care provider adjust your diabetes management plan as needed. Write down: ? Food that you eat before and after you exercise. ? Blood glucose levels before and after you exercise. ? The type and  amount of exercise you have done.  Work with your health care provider when you start a new exercise or activity. He or she may need to: ? Make sure that the activity is safe for you. ? Adjust your insulin, other medicines, and food that you eat.  Drink plenty of water while you exercise. This prevents loss of water (dehydration) and problems caused by a lot of heat in the body (heat stroke).   Where to find more information  American Diabetes Association: www.diabetes.org Summary  Exercising regularly is important for overall health, especially for people who have diabetes mellitus.  Exercising has many health benefits. It increases muscle strength and bone density and reduces body fat and stress. It also lowers and controls blood glucose.  Your health care provider or certified diabetes educator can help you make an activity plan for the type and frequency of exercise that works for you.  Work with your health care provider to make sure any new activity is safe for you. Also work with your health care provider to adjust your insulin, other medicines, and the food you eat. This information is not intended to replace advice given to you by your health care provider. Make sure you discuss any questions you have with your health care provider. Document Revised: 07/10/2019 Document Reviewed: 07/10/2019 Elsevier Patient Education  2021 [employer].

## 2020-12-20 NOTE — Progress Notes (Signed)
ERROR

## 2020-12-20 NOTE — Progress Notes (Signed)
    Chronic Care Management Pharmacy Assistant   Name: Shaun Gomez  MRN: [id number] DOB: 04/19/1951  Reason for Encounter: Adherence Review  Verified Adherence Gap Information. Per insurance data patient has not met their wellness bundle or annual wellness visit screening. Their most recent blood pressure was 130/72 on 06/10/20. Their most recent A1C was 6.54 on 06/10/20. There total gap measures were a total 3.  Follow-Up:  Pharmacist Review   Charlann Lange, [place] Pharmacist Assistant [phone]

## 2021-01-26 ENCOUNTER — Other Ambulatory Visit: Payer: Self-pay | Admitting: Family Medicine

## 2021-03-19 ENCOUNTER — Telehealth: Payer: Self-pay | Admitting: Pharmacist

## 2021-03-19 NOTE — Progress Notes (Signed)
    Chronic Care Management Pharmacy Assistant   Name: Shaun Gomez  MRN: [id number] DOB: [birth year]  Reason for Encounter: Disease State For DM.   Conditions to be addressed/monitored: HTN, CAD, Type 2 DM, HLD, Depression, Hypothyroidism.   Recent office visits:  None since 12/20/20  Recent consult visits:  None since 12/20/20  Hospital visits:  None since 12/20/20  Medications: Outpatient Encounter Medications as of 03/19/2021  Medication Sig  . aspirin 325 MG tablet Take 325 mg by mouth daily.  Marland Kitchen latanoprost (XALATAN) 0.005 % ophthalmic solution Place 1 drop into both eyes at bedtime.   Marland Kitchen levothyroxine (SYNTHROID) 150 MCG tablet Take 1 tablet (150 mcg total) by mouth daily.  Marland Kitchen lisinopril (ZESTRIL) 20 MG tablet Take 1 tablet (20 mg total) by mouth daily.  . metFORMIN (GLUCOPHAGE) 500 MG tablet Take 2 tablets (1,000 mg total) by mouth 2 (two) times daily with a meal.  . Multiple Vitamin (MULTIVITAMIN) tablet Take 1 tablet by mouth daily.  . sertraline (ZOLOFT) 100 MG tablet TAKE 1 TABLET BY MOUTH EVERY DAY  . traZODone (DESYREL) 50 MG tablet TAKE 2 TABLETS BY MOUTH AT BEDTIME AS NEEDED FOR SLEEP.  Marland Kitchen vitamin B-12 (CYANOCOBALAMIN) 1000 MCG tablet Take 1,000 mcg by mouth daily.   No facility-administered encounter medications on file as of 03/19/2021.   Recent Relevant Labs: Lab Results  Component Value Date/Time   HGBA1C 6.4 (H) 06/10/2020 10:27 AM   HGBA1C 14.0 (H) 11/03/2019 03:06 PM   MICROALBUR 0.4 06/10/2020 10:27 AM   MICROALBUR 1.3 10/08/2017 09:27 AM    Kidney Function Lab Results  Component Value Date/Time   CREATININE 0.99 06/10/2020 10:27 AM   CREATININE 1.05 11/20/2019 11:12 AM   GFR 87.98 02/13/2014 04:40 PM   GFRNONAA 77 06/10/2020 10:27 AM   GFRAA 89 06/10/2020 10:27 AM    . Current antihyperglycemic regimen:   Metformin 500mg  two tablets twice daily  . What recent interventions/DTPs have been made to improve glycemic control:  o None.  . Have  there been any recent hospitalizations or ED visits since last visit with CPP?  o None.  . Patient denies hypoglycemic symptoms, including None   . Patient denies hyperglycemic symptoms, including none   . How often are you checking your blood sugar? Patient stated once daily   . What are your blood sugars ranging? Patient stated his blood sugar reading was 136 on 03/19/21  . During the week, how often does your blood glucose drop below 70? Patient stated Never  . Are you checking your feet daily/regularly?  o Patient stated he checks his feet regularly.   Adherence Review: Is the patient currently on a STATIN medication? None.  Is the patient currently on ACE/ARB medication? Lisinopril 20 mg   Does the patient have >5 day gap between last estimated fill dates? Per mis rpts, yes.   Star Rating Drugs: Lisinopril 20 mg 90 DS 11/08/20 , Metformin 500 mg 90 DS 12/19/20  Patient stated he never switched over to ER metformin and the diarrhea has stopped completely. He stated he does not have any questions or concerns about his medication at this time.   Follow-Up:Pharmacist Review  30 min  12/21/20, Hulen Luster Clinical Pharmacist Assistant [phone]

## 2021-03-20 NOTE — Chronic Care Management (AMB) (Signed)
10 minutes spent in review, coordination, and documentation.  Reviewed by: Beverly Milch, PharmD Clinical Pharmacist [facility] [phone]

## 2021-03-26 DIAGNOSIS — H524 Presbyopia: Secondary | ICD-10-CM | POA: Diagnosis not present

## 2021-03-26 DIAGNOSIS — E119 Type 2 diabetes mellitus without complications: Secondary | ICD-10-CM | POA: Diagnosis not present

## 2021-03-26 DIAGNOSIS — H401112 Primary open-angle glaucoma, right eye, moderate stage: Secondary | ICD-10-CM | POA: Diagnosis not present

## 2021-03-26 DIAGNOSIS — H401122 Primary open-angle glaucoma, left eye, moderate stage: Secondary | ICD-10-CM | POA: Diagnosis not present

## 2021-03-26 LAB — HM DIABETES EYE EXAM

## 2021-03-27 ENCOUNTER — Other Ambulatory Visit: Payer: Self-pay

## 2021-03-27 ENCOUNTER — Encounter: Payer: Self-pay | Admitting: Family Medicine

## 2021-03-27 MED ORDER — LISINOPRIL 20 MG PO TABS
20.0000 mg | ORAL_TABLET | Freq: Every day | ORAL | 3 refills | Status: DC
Start: 2021-03-27 — End: 2022-05-07

## 2021-06-09 DIAGNOSIS — H9191 Unspecified hearing loss, right ear: Secondary | ICD-10-CM | POA: Diagnosis not present

## 2021-06-16 DIAGNOSIS — H903 Sensorineural hearing loss, bilateral: Secondary | ICD-10-CM | POA: Insufficient documentation

## 2021-06-16 DIAGNOSIS — H90A31 Mixed conductive and sensorineural hearing loss, unilateral, right ear with restricted hearing on the contralateral side: Secondary | ICD-10-CM | POA: Diagnosis not present

## 2021-06-16 DIAGNOSIS — H6991 Unspecified Eustachian tube disorder, right ear: Secondary | ICD-10-CM | POA: Insufficient documentation

## 2021-06-16 DIAGNOSIS — H6981 Other specified disorders of Eustachian tube, right ear: Secondary | ICD-10-CM | POA: Diagnosis not present

## 2021-06-16 DIAGNOSIS — H90A22 Sensorineural hearing loss, unilateral, left ear, with restricted hearing on the contralateral side: Secondary | ICD-10-CM | POA: Diagnosis not present

## 2021-06-19 ENCOUNTER — Other Ambulatory Visit: Payer: Self-pay

## 2021-06-19 ENCOUNTER — Telehealth (INDEPENDENT_AMBULATORY_CARE_PROVIDER_SITE_OTHER): Payer: Medicare Other | Admitting: Nurse Practitioner

## 2021-06-19 DIAGNOSIS — J069 Acute upper respiratory infection, unspecified: Secondary | ICD-10-CM | POA: Diagnosis not present

## 2021-06-19 NOTE — Progress Notes (Signed)
Subjective:    Patient ID: Shaun Gomez, male    DOB: 12-25-1950, 70 y.o.   MRN: [id number]  HPI: Shaun Gomez is a 70 y.o. male presenting virtually for cough.  Chief Complaint  Patient presents with   URI   UPPER RESPIRATORY TRACT INFECTION Onset: Sunday COVID-19 testing history: not tested COVID-19 vaccination status: no vaccine Fever: no Cough: yes; congested - green Shortness of breath: no Wheezing: no Chest pain: no Chest tightness: no Chest congestion: yes Nasal congestion: yes Runny nose: no Post nasal drip: no Sneezing: no Sore throat: no Swollen glands: no Sinus pressure: yes; above eyes  Headache: no Face pain: no Toothache: no Ear pain: no  Ear pressure: no  Eyes red/itching:no Eye drainage/crusting: no  Nausea: no  Vomiting: no Diarrhea: no  Change in appetite: yes, decreased Loss of taste/smell: no  Rash: no Fatigue: yes Sick contacts: no Strep contacts: no  Context: fluctuating  Recurrent sinusitis: no Treatments attempted: Mucinex, cough drops Relief with OTC medications: no  Allergies  Allergen Reactions   Caverject [Alprostadil]     MI after injection    Outpatient Encounter Medications as of 06/19/2021  Medication Sig   predniSONE (STERAPRED UNI-PAK 21 TAB) 10 MG (21) TBPK tablet Take by mouth.   aspirin 325 MG tablet Take 325 mg by mouth daily.   fluticasone (FLONASE) 50 MCG/ACT nasal spray Place into both nostrils.   latanoprost (XALATAN) 0.005 % ophthalmic solution Place 1 drop into both eyes at bedtime.    levothyroxine (SYNTHROID) 150 MCG tablet Take 1 tablet (150 mcg total) by mouth daily.   lisinopril (ZESTRIL) 20 MG tablet Take 1 tablet (20 mg total) by mouth daily.   metFORMIN (GLUCOPHAGE) 500 MG tablet Take 2 tablets (1,000 mg total) by mouth 2 (two) times daily with a meal.   Multiple Vitamin (MULTIVITAMIN) tablet Take 1 tablet by mouth daily.   sertraline (ZOLOFT) 100 MG tablet TAKE 1 TABLET BY MOUTH EVERY DAY    traZODone (DESYREL) 50 MG tablet TAKE 2 TABLETS BY MOUTH AT BEDTIME AS NEEDED FOR SLEEP.   vitamin B-12 (CYANOCOBALAMIN) 1000 MCG tablet Take 1,000 mcg by mouth daily.   No facility-administered encounter medications on file as of 06/19/2021.    Patient Active Problem List   Diagnosis Date Noted   LLQ pain 02/13/2014   Type II or unspecified type diabetes mellitus without mention of complication, not stated as uncontrolled 12/08/2013   Pain in joint, ankle and foot 04/23/2013   OSA on CPAP    Routine general medical examination at a health care facility 03/25/2012   Depression 03/25/2012   VENTRAL HERNIA, ASYMPTOMATIC 08/13/2010   LIVER FUNCTION TESTS, ABNORMAL 11/03/2007   Hypothyroidism 02/17/2007   Hyperlipemia 02/17/2007   Essential hypertension 02/17/2007   INFARCTION, MYOCARDIAL, OLD 02/17/2007   CAD 02/17/2007    Past Medical History:  Diagnosis Date   CAD (coronary artery disease)    Depression 07/1993   Diabetes mellitus without complication (HCC)    Hyperlipidemia 02/1996   Hypertension pre 08/25/1996   OSA on CPAP    Thyroid disease 07/1993   Hypothyroidism    Relevant past medical, surgical, family and social history reviewed and updated as indicated. Interim medical history since our last visit reviewed.  Review of Systems Per HPI unless specifically indicated above     Objective:    There were no vitals taken for this visit.  Wt Readings from Last 3 Encounters:  09/24/20 208 lb (94.3  kg)  06/03/20 192 lb (87.1 kg)  11/20/19 186 lb (84.4 kg)    Physical Exam Physical examination unable to be performed due to lack of equipment.  Results for orders placed or performed in visit on 03/26/21  HM DIABETES EYE EXAM  Result Value Ref Range   HM Diabetic Eye Exam No Retinopathy No Retinopathy      Assessment & Plan:  1. Upper respiratory tract infection, unspecified type Acute x 4 days.  Encouraged respiratory viral testing.  Reassured patient that  symptoms and exam findings are most consistent with a viral upper respiratory infection and explained lack of efficacy of antibiotics against viruses.  Discussed expected course and features suggestive of secondary bacterial infection.  Continue supportive care. Increase fluid intake with water or electrolyte solution like pedialyte. Encouraged OTC guaifenesin. Encouraged saline sinus flushes and/or neti with humidified air. Has been prescribed prednisone by otolaryngologist for ear pressure - discussed that this may be beneficial also for his URI.  Patient requested antibiotics to "get rid of this mess."  I reiterated that I would not be prescribing antibiotics because I do not believe he has a bacterial infection and instead would like to save them for a later use when he really needs them.  Patient then ended the call saying "you are not doing me any good."  Follow up plan: Return if symptoms worsen or fail to improve.   This visit was completed via telephone due to the restrictions of the COVID-19 pandemic. All issues as above were discussed and addressed but no physical exam was performed. If it was felt that the patient should be evaluated in the office, they were directed there. The patient verbally consented to this visit. Patient was unable to complete an audio/visual visit due to Technical difficulties.  Patient received error message saying browser did not support video visit platform. Location of the patient: home Location of the provider: work Those involved with this call:  Provider: Cathlean Marseilles, DNP, FNP-C CMA: n/a Front Desk/Registration: Claudine Mouton  Time spent on call:  12 minutes on the phone discussing health concerns. 13 minutes total spent in review of patient's record and preparation of their chart. I verified patient identity using two factors (patient name and date of birth). Patient consents verbally to being seen via telemedicine visit today.

## 2021-06-23 ENCOUNTER — Telehealth: Payer: Self-pay

## 2021-06-25 ENCOUNTER — Other Ambulatory Visit: Payer: Self-pay | Admitting: Family Medicine

## 2021-06-25 DIAGNOSIS — E039 Hypothyroidism, unspecified: Secondary | ICD-10-CM

## 2021-07-11 ENCOUNTER — Telehealth: Payer: Self-pay | Admitting: Pharmacist

## 2021-07-11 NOTE — Progress Notes (Addendum)
Chronic Care Management Pharmacy Assistant   Name: Shaun Gomez  MRN: [id number] DOB: 30-Dec-1950  Reason for Encounter: Disease State For HTN.    Conditions to be addressed/monitored: HTN, CAD, Type 2 DM, HLD, Depression, Hypothyroidism.   Recent office visits:  06/19/21 (Video Visit) Valentino Nose, NP. For upper respiratory tract infection. No medication changes.   Recent consult visits:  06/16/21 Otolaryngology [place], Cris[place] For initial consult. No information given. 06/09/21 [facility] [place], [place], [place]  For hearing loss of right ear. No medication changes.  03/26/21 Ophthalmology [place] No information given.  Hospital visits:  None since 03/19/21.  Medications: Outpatient Encounter Medications as of 07/11/2021  Medication Sig   aspirin 325 MG tablet Take 325 mg by mouth daily.   fluticasone (FLONASE) 50 MCG/ACT nasal spray Place into both nostrils.   latanoprost (XALATAN) 0.005 % ophthalmic solution Place 1 drop into both eyes at bedtime.    levothyroxine (SYNTHROID) 150 MCG tablet TAKE 1 TABLET BY MOUTH EVERY DAY   lisinopril (ZESTRIL) 20 MG tablet Take 1 tablet (20 mg total) by mouth daily.   metFORMIN (GLUCOPHAGE) 500 MG tablet Take 2 tablets (1,000 mg total) by mouth 2 (two) times daily with a meal.   Multiple Vitamin (MULTIVITAMIN) tablet Take 1 tablet by mouth daily.   sertraline (ZOLOFT) 100 MG tablet TAKE 1 TABLET BY MOUTH EVERY DAY   traZODone (DESYREL) 50 MG tablet TAKE 2 TABLETS BY MOUTH AT BEDTIME AS NEEDED FOR SLEEP.   vitamin B-12 (CYANOCOBALAMIN) 1000 MCG tablet Take 1,000 mcg by mouth daily.   No facility-administered encounter medications on file as of 07/11/2021.   Reviewed chart prior to disease state call. Spoke with patient regarding BP  Recent Office Vitals: BP Readings from Last 3 Encounters:  09/24/20 140/80  06/03/20 130/72  11/20/19 120/70   Pulse Readings from Last 3 Encounters:  09/24/20 78  06/03/20 87   11/20/19 90    Wt Readings from Last 3 Encounters:  09/24/20 208 lb (94.3 kg)  06/03/20 192 lb (87.1 kg)  11/20/19 186 lb (84.4 kg)     Kidney Function Lab Results  Component Value Date/Time   CREATININE 0.99 06/10/2020 10:27 AM   CREATININE 1.05 11/20/2019 11:12 AM   GFR 87.98 02/13/2014 04:40 PM   GFRNONAA 77 06/10/2020 10:27 AM   GFRAA 89 06/10/2020 10:27 AM    BMP Latest Ref Rng & Units 06/10/2020 11/20/2019 11/03/2019  Glucose 65 - 99 mg/dL 409(W) 119(J) 478(G)  BUN 7 - 25 mg/dL 23 95(A) 15  Creatinine 0.70 - 1.18 mg/dL 2.13 0.86 5.78  BUN/Creat Ratio 6 - 22 (calc) NOT APPLICABLE 26(H) NOT APPLICABLE  Sodium 135 - 146 mmol/L 135 135 133(L)  Potassium 3.5 - 5.3 mmol/L 4.5 4.5 4.6  Chloride 98 - 110 mmol/L 100 102 97(L)  CO2 20 - 32 mmol/L 25 23 25   Calcium 8.6 - 10.3 mg/dL 9.6 9.6 9.0    Current antihypertensive regimen:  Lisinopril 20 mg  How often are you checking your Blood Pressure? Patient stated infrequently  Current home BP readings: Patient stated his blood pressure readings fluxuates and it depends on the day when it comes to the blood pressure readings.   What recent interventions/DTPs have been made by any provider to improve Blood Pressure control since last CPP Visit: None.   Any recent hospitalizations or ED visits since last visit with CPP? Patient stated no.  What diet changes have been made to improve Blood  Pressure Control?  Patient stated drinks plenty of water.   What exercise is being done to improve your Blood Pressure Control?  Patient stated he is active.   Adherence Review: Is the patient currently on ACE/ARB medication? Lisinopril 20 mg  Does the patient have >5 day gap between last estimated fill dates? Per misc rpts, no.   Care Gaps: Patient is due for a colonoscopy, foot and eye exam.  Patient is due for his AWV.  Patients most recent HBA1C is 6.4  Patient blood pressure is uncontrolled.    Star Rating Drugs: Lisinopril 20 mg  06/27/21 90 DS, Metformin 500 mg 12/19/20 90 DS.   Follow-Up:Pharmacist Review  Hulen Luster, RMA Clinical Pharmacist Assistant [phone]

## 2021-07-15 ENCOUNTER — Telehealth: Payer: Self-pay

## 2021-07-15 MED ORDER — METFORMIN HCL 500 MG PO TABS
1000.0000 mg | ORAL_TABLET | Freq: Two times a day (BID) | ORAL | 0 refills | Status: DC
Start: 2021-07-15 — End: 2021-08-08

## 2021-07-15 NOTE — Telephone Encounter (Signed)
Pt called in requesting a refill of metFORMIN (GLUCOPHAGE) 500   Cb#: [phone]

## 2021-07-15 NOTE — Telephone Encounter (Signed)
Prescription sent to pharmacy.

## 2021-07-17 DIAGNOSIS — H903 Sensorineural hearing loss, bilateral: Secondary | ICD-10-CM | POA: Diagnosis not present

## 2021-08-01 ENCOUNTER — Other Ambulatory Visit: Payer: Self-pay | Admitting: Physician Assistant

## 2021-08-01 DIAGNOSIS — H903 Sensorineural hearing loss, bilateral: Secondary | ICD-10-CM

## 2021-08-04 ENCOUNTER — Ambulatory Visit (INDEPENDENT_AMBULATORY_CARE_PROVIDER_SITE_OTHER): Payer: Medicare Other | Admitting: Family Medicine

## 2021-08-04 ENCOUNTER — Other Ambulatory Visit: Payer: Self-pay

## 2021-08-04 VITALS — BP 130/78 | HR 86 | Temp 98.0°F | Resp 16 | Ht 66.0 in | Wt 205.0 lb

## 2021-08-04 DIAGNOSIS — R053 Chronic cough: Secondary | ICD-10-CM | POA: Diagnosis not present

## 2021-08-04 MED ORDER — LEVOFLOXACIN 500 MG PO TABS: 500.0000 mg | ORAL_TABLET | Freq: Every day | ORAL | 0 refills | Status: AC

## 2021-08-04 NOTE — Progress Notes (Signed)
Subjective:    Patient ID: Shaun Gomez, male    DOB: Jan 01, 1951, 70 y.o.   MRN: [id number]  I have not seen the patient in almost a year.  He is long overdue for fasting lab work.  However he presents today complaining of a cough that has been present for more than 5 weeks.  He reports cough productive of mucus.  He denies any fevers or chills.  He does report chest congestion.  He has tried Mucinex.  He is tried tincture of time.  He denies any hemoptysis.  He denies any pleurisy.  He denies any severe shortness of breath.  However on examination today he has pronounced right basilar crackles.  He is long overdue for fasting lab work. Past Medical History:  Diagnosis Date   CAD (coronary artery disease)    Depression 07/1993   Diabetes mellitus without complication (HCC)    Hyperlipidemia 02/1996   Hypertension pre 08/25/1996   OSA on CPAP    Thyroid disease 07/1993   Hypothyroidism   Past Surgical History:  Procedure Laterality Date   APPENDECTOMY  3 YOA   CATARACT EXTRACTION, BILATERAL     CORONARY ARTERY BYPASS GRAFT  02/1996   X 2 EF 30%   MI S/P Caverject Injection  02/1996   RETINAL DETACHMENT SURGERY     Sleep Study  01/03/2006   Severe sleep apnea - 88 events per hour )2 sats down to 50% ?   Stress Cardiolite  03/1996   EF 30%   Current Outpatient Medications on File Prior to Visit  Medication Sig Dispense Refill   aspirin 325 MG tablet Take 325 mg by mouth daily.     latanoprost (XALATAN) 0.005 % ophthalmic solution Place 1 drop into both eyes at bedtime.      levothyroxine (SYNTHROID) 150 MCG tablet TAKE 1 TABLET BY MOUTH EVERY DAY 90 tablet 3   lisinopril (ZESTRIL) 20 MG tablet Take 1 tablet (20 mg total) by mouth daily. 90 tablet 3   metFORMIN (GLUCOPHAGE) 500 MG tablet Take 2 tablets (1,000 mg total) by mouth 2 (two) times daily with a meal. 120 tablet 0   sertraline (ZOLOFT) 100 MG tablet TAKE 1 TABLET BY MOUTH EVERY DAY 90 tablet 2   timolol (TIMOPTIC) 0.25 %  ophthalmic solution 1 drop 2 (two) times daily.     traZODone (DESYREL) 50 MG tablet TAKE 2 TABLETS BY MOUTH AT BEDTIME AS NEEDED FOR SLEEP. 180 tablet 2   Multiple Vitamin (MULTIVITAMIN) tablet Take 1 tablet by mouth daily. (Patient not taking: Reported on 08/04/2021)     No current facility-administered medications on file prior to visit.   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  Tobacco Use   Smoking status: Former    Packs/day: 2.00    Years: 15.00    Pack years: 30.00    Types: Cigarettes   Smokeless tobacco: Never   Tobacco comments:    Quit 1983  Substance and Sexual Activity   Alcohol use: Yes    Comment: case of beer  Drug use: Not on file   Sexual activity: Not on file  Other Topics Concern   Not on file  Social History Narrative   Re-married, lives with wife   2 step-children, 2 granddaughters   Working at [employer] in [employer], Materials engineer   Social Determinants of Corporate investment banker Strain: Low Risk    Difficulty of Paying Living Expenses: Not very hard  Food Insecurity: Not on file  Transportation Needs: Not on file  Physical Activity: Not on file  Stress: Not on file  Social Connections: Not on file  Intimate Partner Violence: Not on file   Family History  Problem Relation Age of Onset   Hypertension Mother    Parkinsonism Mother    Dementia Mother    Diabetes Mother    Stroke Father 20   Depression Sister    Anxiety disorder Sister    Heart disease Brother 71       CAD   Hyperlipidemia Brother    Hypertension Brother    Arthritis Neg Hx    Cancer  Neg Hx    Alcohol abuse Neg Hx    Drug abuse Neg Hx    Colon cancer Neg Hx    Prostate cancer Neg Hx    Heart disease Brother 24       CAD      Review of Systems  All other systems reviewed and are negative.     Objective:   Physical Exam Vitals reviewed.  Constitutional:      General: He is not in acute distress.    Appearance: He is well-developed. He is not diaphoretic.  HENT:     Head: Normocephalic and atraumatic.     Right Ear: External ear normal.     Left Ear: External ear normal.     Nose: Nose normal.     Mouth/Throat:     Pharynx: No oropharyngeal exudate.  Eyes:     General: No scleral icterus.    Conjunctiva/sclera: Conjunctivae normal.     Pupils: Pupils are equal, round, and reactive to light.  Neck:     Thyroid: No thyromegaly.     Vascular: No JVD.  Cardiovascular:     Rate and Rhythm: Normal rate and regular rhythm.     Heart sounds: Normal heart sounds. No murmur heard. Pulmonary:     Effort: Pulmonary effort is normal. No respiratory distress.     Breath sounds: No stridor. Rales present. No wheezing or rhonchi.    Abdominal:     General: Bowel sounds are normal. There is no distension.     Palpations: Abdomen is soft.     Tenderness: There is no abdominal tenderness. There is no guarding or rebound.  Musculoskeletal:     Cervical back: Neck supple.  Lymphadenopathy:     Cervical: No cervical adenopathy.  Neurological:     Mental Status: He is alert and oriented to person, place, and time.     Cranial Nerves: No cranial nerve deficit.     Motor: No abnormal muscle tone.     Coordination: Coordination normal.     Deep Tendon Reflexes: Reflexes are normal and symmetric.         Assessment & Plan:  Chronic cough - Plan: DG Chest 2 View Obtain chest x-ray to evaluate the source of the crackles.  Meanwhile I will treat the patient for walking pneumonia with Levaquin 500 mg daily for 7 days.  Encourage the patient to also  use Mucinex.  If  chest x-ray is clear, and cough persist, I would recommend CT scan of the chest to evaluate for interstitial lung disease versus bronchiectasis given the abnormal breath sounds.  Return fasting for CBC CMP fasting lipid panel A1c and TSH

## 2021-08-08 ENCOUNTER — Ambulatory Visit
Admission: RE | Admit: 2021-08-08 | Discharge: 2021-08-08 | Disposition: A | Discharge status: Home / Self Care | Payer: Medicare Other | Source: Ambulatory Visit | Attending: Family Medicine | Admitting: Family Medicine

## 2021-08-08 ENCOUNTER — Other Ambulatory Visit: Payer: Self-pay | Admitting: Family Medicine

## 2021-08-08 DIAGNOSIS — R053 Chronic cough: Secondary | ICD-10-CM | POA: Diagnosis not present

## 2021-08-13 ENCOUNTER — Ambulatory Visit
Admission: RE | Admit: 2021-08-13 | Discharge: 2021-08-13 | Disposition: A | Discharge status: Home / Self Care | Payer: Medicare Other | Source: Ambulatory Visit | Attending: Physician Assistant | Admitting: Physician Assistant

## 2021-08-13 DIAGNOSIS — I639 Cerebral infarction, unspecified: Secondary | ICD-10-CM | POA: Diagnosis not present

## 2021-08-13 DIAGNOSIS — H903 Sensorineural hearing loss, bilateral: Secondary | ICD-10-CM

## 2021-08-13 MED ORDER — GADOBENATE DIMEGLUMINE 529 MG/ML IV SOLN
19.0000 mL | Freq: Once | INTRAVENOUS | Status: AC | PRN
  Administered 2021-08-13: 19 mL via INTRAVENOUS

## 2021-09-01 NOTE — Progress Notes (Signed)
Chronic Care Management Pharmacy Note  09/04/2021 Name:  THARON BOMAR MRN:  [id number] DOB:  08/06/1951  Subjective: Shaun Gomez is an 70 y.o. year old male who is a primary patient of Pickard, Cammie Mcgee, MD.  The CCM team was consulted for assistance with disease management and care coordination needs.    Engaged with patient by telephone for follow up visit in response to provider referral for pharmacy case management and/or care coordination services.   Consent to Services:  The patient was given the following information about Chronic Care Management services today, agreed to services, and gave verbal consent: 1. CCM service includes personalized support from designated clinical staff supervised by the primary care provider, including individualized plan of care and coordination with other care providers 2. 24/7 contact phone numbers for assistance for urgent and routine care needs. 3. Service will only be billed when office clinical staff spend 20 minutes or more in a month to coordinate care. 4. Only one practitioner may furnish and bill the service in a calendar month. 5.The patient may stop CCM services at any time (effective at the end of the month) by phone call to the office staff. 6. The patient will be responsible for cost sharing (co-pay) of up to 20% of the service fee (after annual deductible is met). Patient agreed to services and consent obtained.  Patient Care Team: Susy Frizzle, MD as PCP - General (Family Medicine) Edythe Clarity, [facility] as Pharmacist (Pharmacist)  Recent office visits: 11/30/521 Dennard Schaumann) - given trazodone for sleep, also TSH ordered and was slightly high - no change to medication dose  Recent consult visits: None since last CCM visit  Hospital visits: None in previous 6 months  Objective:  Lab Results  Component Value Date   CREATININE 0.99 06/10/2020   BUN 23 06/10/2020   GFR 87.98 02/13/2014   GFRNONAA 77 06/10/2020   GFRAA 89  06/10/2020   NA 135 06/10/2020   K 4.5 06/10/2020   CALCIUM 9.6 06/10/2020   CO2 25 06/10/2020    Lab Results  Component Value Date/Time   HGBA1C 6.4 (H) 06/10/2020 10:27 AM   HGBA1C 14.0 (H) 11/03/2019 03:06 PM   GFR 87.98 02/13/2014 04:40 PM   GFR 96.45 12/04/2013 07:55 AM   MICROALBUR 0.4 06/10/2020 10:27 AM   MICROALBUR 1.3 10/08/2017 09:27 AM    Last diabetic Eye exam:  Lab Results  Component Value Date/Time   HMDIABEYEEXA No Retinopathy 03/26/2021 09:55 AM    Last diabetic Foot exam: No results found for: HMDIABFOOTEX   Lab Results  Component Value Date   CHOL 162 06/10/2020   HDL 47 06/10/2020   LDLCALC 85 06/10/2020   TRIG 200 (H) 06/10/2020   CHOLHDL 3.4 06/10/2020    Hepatic Function Latest Ref Rng & Units 06/10/2020 11/20/2019 11/03/2019  Total Protein 6.1 - 8.1 g/dL 7.5 8.0 7.6  Albumin 3.6 - 5.1 g/dL - - -  AST 10 - 35 U/L 25 84(H) 111(H)  ALT 9 - 46 U/L 28 115(H) 139(H)  Alk Phosphatase 40 - 115 U/L - - -  Total Bilirubin 0.2 - 1.2 mg/dL 0.3 0.4 0.6  Bilirubin, Direct 0.0 - 0.3 mg/dL - - -    Lab Results  Component Value Date/Time   TSH 4.75 (H) 09/24/2020 11:37 AM   TSH 0.11 (L) 06/10/2020 10:27 AM   FREET4 0.77 08/26/2010 08:53 AM   FREET4 0.9 07/15/2009 09:50 AM    CBC Latest Ref Rng &  Units 06/10/2020 11/03/2019 10/31/2018  WBC 3.8 - 10.8 Thousand/uL 8.8 9.2 9.7  Hemoglobin 13.2 - 17.1 g/dL 14.3 15.2 15.8  Hematocrit 38.5 - 50.0 % 42.5 44.4 45.9  Platelets 140 - 400 Thousand/uL 229 191 310    Lab Results  Component Value Date/Time   VD25OH 34 08/26/2010 09:39 PM   VD25OH 22 (L) 07/15/2009 09:40 PM    Clinical ASCVD: Yes  The ASCVD Risk score (Arnett DK, et al., 2019) failed to calculate for the following reasons:   The patient has a prior MI or stroke diagnosis    Depression screen [facility] 2/9 08/04/2021 11/18/2018 10/08/2017  Decreased Interest 0 2 1  Down, Depressed, Hopeless 1 1 1   PHQ - 2 Score 1 3 2   Altered sleeping 0 2 1  Tired,  decreased energy 0 3 1  Change in appetite 0 0 1  Feeling bad or failure about yourself  0 1 1  Trouble concentrating 0 0 0  Moving slowly or fidgety/restless 0 0 0  Suicidal thoughts 0 0 0  PHQ-9 Score 1 9 6   Difficult doing work/chores Not difficult at all Somewhat difficult Not difficult at all      Social History   Tobacco Use  Smoking Status Former   Packs/day: 2.00   Years: 15.00   Pack years: 30.00   Types: Cigarettes  Smokeless Tobacco Never  Tobacco Comments   Quit 1983   BP Readings from Last 3 Encounters:  08/04/21 130/78  09/24/20 140/80  06/03/20 130/72   Pulse Readings from Last 3 Encounters:  08/04/21 86  09/24/20 78  06/03/20 87   Wt Readings from Last 3 Encounters:  08/04/21 205 lb (93 kg)  09/24/20 208 lb (94.3 kg)  06/03/20 192 lb (87.1 kg)    Assessment/Interventions: Review of patient past medical history, allergies, medications, health status, including review of consultants reports, laboratory and other test data, was performed as part of comprehensive evaluation and provision of chronic care management services.   SDOH:  (Social Determinants of Health) assessments and interventions performed: No   CCM Care Plan  Allergies  Allergen Reactions   Caverject [Alprostadil]     MI after injection    Medications Reviewed Today     Reviewed by Edythe Clarity, [facility] (Pharmacist) on 09/04/21 at 1117  Med List Status: <None>   Medication Order Taking? Sig Documenting Provider Last Dose Status Informant  aspirin 325 MG tablet [id number] Yes Take 325 mg by mouth daily. [provider] Taking Active   latanoprost (XALATAN) 0.005 % ophthalmic solution [id number] Yes Place 1 drop into both eyes at bedtime.  [provider] Taking Active   levothyroxine (SYNTHROID) 150 MCG tablet [id number] Yes TAKE 1 TABLET BY MOUTH EVERY DAY Susy Frizzle, MD Taking Active   lisinopril (ZESTRIL) 20 MG tablet [id number] Yes Take 1 tablet (20 mg  total) by mouth daily. Susy Frizzle, MD Taking Active   metFORMIN (GLUCOPHAGE) 500 MG tablet [id number] Yes TAKE 2 TABLETS (1,000 MG TOTAL) BY MOUTH 2 (TWO) TIMES DAILY WITH A MEAL. Susy Frizzle, MD Taking Active   Multiple Vitamin (MULTIVITAMIN) tablet [id number] Yes Take 1 tablet by mouth daily. [provider] Taking Active   sertraline (ZOLOFT) 100 MG tablet [id number] Yes TAKE 1 TABLET BY MOUTH EVERY DAY Susy Frizzle, MD Taking Active   timolol (TIMOPTIC) 0.25 % ophthalmic solution [id number] Yes 1 drop 2 (two) times daily. [provider] Taking Active   traZODone (DESYREL) 50  MG tablet [id number] Yes TAKE 2 TABLETS BY MOUTH AT BEDTIME AS NEEDED FOR SLEEP. Susy Frizzle, MD Taking Active             Patient Active Problem List   Diagnosis Date Noted   LLQ pain 02/13/2014   Type II or unspecified type diabetes mellitus without mention of complication, not stated as uncontrolled 12/08/2013   Pain in joint, ankle and foot 04/23/2013   OSA on CPAP    Routine general medical examination at a health care facility 03/25/2012   Depression 03/25/2012   VENTRAL HERNIA, ASYMPTOMATIC 08/13/2010   LIVER FUNCTION TESTS, ABNORMAL 11/03/2007   Hypothyroidism 02/17/2007   Hyperlipemia 02/17/2007   Essential hypertension 02/17/2007   INFARCTION, MYOCARDIAL, OLD 02/17/2007   CAD 02/17/2007    Immunization History  Administered Date(s) Administered   Td 07/26/1996, 02/18/2007   Tdap 08/15/2013    Conditions to be addressed/monitored:  HTN, CAD, Hypothyroidism, DM, HLD, Depression.    Care Plan : General Pharmacy (Adult)  Updates made by Edythe Clarity, RPH since 09/04/2021 12:00 AM     Problem: HTN, HLD, Depression, DM   Priority: High  Onset Date: 12/20/2020     Long-Range Goal: Patient Stated   Start Date: 12/20/2020  Expected End Date: 06/19/2021  Recent Progress: On track  Priority: High  Note:   Current Barriers:  GI with  metformin   Pharmacist Clinical Goal(s):  Over the next 180 days, patient will maintain control of blood glucose as evidenced by home monitoring, A1c  contact provider office for questions/concerns as evidenced notation of same in electronic health record Contact providers to switch to XR version of metformin through collaboration with PharmD and provider.   Interventions: 1:1 collaboration with Susy Frizzle, MD regarding development and update of comprehensive plan of care as evidenced by provider attestation and co-signature Inter-disciplinary care team collaboration (see longitudinal plan of care) Comprehensive medication review performed; medication list updated in electronic medical record  Hypertension (BP goal <130/80) -controlled -Current treatment: Lisinopril 52m -Medications previously tried: none noted  -Current home readings: not checking regularly -Denies hypotensive/hypertensive symptoms -Educated on BP goals and benefits of medications for prevention of heart attack, stroke and kidney damage; Importance of home blood pressure monitoring; -Counseled to monitor BP at home periodically, document, and provide log at future appointments -Recommended to continue current medication  Hyperlipidemia: (LDL goal < 70) -uncontrolled -Current treatment: No medications -Medications previously tried: statins (myalgias)  -Current dietary patterns: patient has made great dietary changes, no longer drinking alcohol -Current exercise habits:  -Educated on Cholesterol goals;  Importance of limiting foods high in cholesterol; -Recommended new lab workup at next phsyical  Diabetes (A1c goal <7%) -controlled -Current medications: Metformin 5039mtwo tablets twice daily -Medications previously tried: glipizide (d/c due to lifestyle changes)  -Current home glucose readings fasting glucose: 114 post prandial glucose:  -Denies hypoglycemic/hyperglycemic symptoms -He does  reports some GI issues (diarrhea) with metformin.  Discussed switching to ER and patient was agreeable, however he wanted to finish out his current supply before making this switch.  He will mention to Dr. PiDennard Schaumannhen he comes in for physical. -Educated onA1c and blood sugar goals; Benefits of routine self-monitoring of blood sugar; -Counseled to check feet daily and get yearly eye exams -Recommended to continue current medication Counseled on lesser GI effects seen with XR version  Update 09/04/2021 135-150 AM fasting blood sugars recently He continues to take 200037mf Metformin daily He did express to  me that he is still experiencing GI issues with metformin.  In the past we have discussed switching to the XR version of metformin to try and alleviate some of this.  Will consult with PCP on switch.  Would like refill called in to on file pharmacy if approved.  Depression/Anxiety (Goal: Minimize symptoms) -controlled -Current treatment: Trazodone 6m daily Sertraline 1039m-Medications previously tried/failed: effexor -Reports sleep has improved since starting Trazodone. -Discussed range of doses he could use (2579m00mg) he is currently taking 61m80md controlled. -Educated on Benefits of medication for symptom control -Recommended to continue current medication  Hypothyroidism (Goal: Maintain TSH) -Not ideally controlled - last Tsh was elevated -Current treatment  Levothyroxine 161mc75mily -Medications previously tried: none noted -Reminded patient to come get fasting labs - have not been done in close to one year.  -Recommended to continue current medication FU on labs at next check in call.    Patient Goals/Self-Care Activities Over the next 180 days, patient will:  - take medications as prescribed check glucose daily, document, and provide at future appointments notify providers to switch to XR version of Metformin  Follow Up Plan: The care management team will reach  out to the patient again over the next 180 days.              Medication Assistance: None required.  Patient affirms current coverage meets needs.  Patient's preferred pharmacy is:  [id number], [place] [address] [facility] [place] [address]: [phone] [facility] [address] [address]: [phone] pill box? Yes Pt endorses 100% compliance  We discussed: Benefits of medication synchronization, packaging and delivery as well as enhanced pharmacist oversight with [employer]. Patient decided to: Continue current medication management strategy  Care Plan and Follow Up Patient Decision:  Patient agrees to Care Plan and Follow-up.  Plan: The care management team will reach out to the patient again over the next 180 days.  ChristBeverly MilchmD Clinical Pharmacist [facility] [phone]

## 2021-09-02 ENCOUNTER — Telehealth: Payer: Self-pay | Admitting: Family Medicine

## 2021-09-02 NOTE — Telephone Encounter (Signed)
Left message for patient to call back and schedule Medicare Annual Wellness Visit (AWV) in office.  ° °If not able to come in office, please offer to do virtually or by telephone.  Left office number and my jabber #[phone]. ° °Due for AWVI ° °Please schedule at anytime with Nurse Health Advisor. °  °

## 2021-09-04 ENCOUNTER — Ambulatory Visit (INDEPENDENT_AMBULATORY_CARE_PROVIDER_SITE_OTHER): Payer: Medicare Other | Admitting: Pharmacist

## 2021-09-04 DIAGNOSIS — E119 Type 2 diabetes mellitus without complications: Secondary | ICD-10-CM

## 2021-09-04 DIAGNOSIS — E039 Hypothyroidism, unspecified: Secondary | ICD-10-CM

## 2021-09-04 NOTE — Patient Instructions (Addendum)
Visit Information   Goals Addressed             This Visit's Progress    Manage My Medicine   On track    Timeframe:  Long-Range Goal Priority:  High Start Date:   12/20/20                          Expected End Date: 06/19/21                      Follow Up Date 03/25/21   - call for medicine refill 2 or 3 days before it runs out - call if I am sick and can't take my medicine - use a pillbox to sort medicine    Why is this important?   These steps will help you keep on track with your medicines.   Notes: Contact providers when you need to switch to XR metformin.       Patient Care Plan: General Pharmacy (Adult)     Problem Identified: HTN, HLD, Depression, DM   Priority: High  Onset Date: 12/20/2020     Long-Range Goal: Patient Stated   Start Date: 12/20/2020  Expected End Date: 06/19/2021  Recent Progress: On track  Priority: High  Note:   Current Barriers:  GI with metformin   Pharmacist Clinical Goal(s):  Over the next 180 days, patient will maintain control of blood glucose as evidenced by home monitoring, A1c  contact provider office for questions/concerns as evidenced notation of same in electronic health record Contact providers to switch to XR version of metformin through collaboration with PharmD and provider.   Interventions: 1:1 collaboration with Donita Brooks, MD regarding development and update of comprehensive plan of care as evidenced by provider attestation and co-signature Inter-disciplinary care team collaboration (see longitudinal plan of care) Comprehensive medication review performed; medication list updated in electronic medical record  Hypertension (BP goal <130/80) -controlled -Current treatment: Lisinopril 20mg  -Medications previously tried: none noted  -Current home readings: not checking regularly -Denies hypotensive/hypertensive symptoms -Educated on BP goals and benefits of medications for prevention of heart attack, stroke  and kidney damage; Importance of home blood pressure monitoring; -Counseled to monitor BP at home periodically, document, and provide log at future appointments -Recommended to continue current medication  Hyperlipidemia: (LDL goal < 70) -uncontrolled -Current treatment: No medications -Medications previously tried: statins (myalgias)  -Current dietary patterns: patient has made great dietary changes, no longer drinking alcohol -Current exercise habits:  -Educated on Cholesterol goals;  Importance of limiting foods high in cholesterol; -Recommended new lab workup at next phsyical  Diabetes (A1c goal <7%) -controlled -Current medications: Metformin 500mg  two tablets twice daily -Medications previously tried: glipizide (d/c due to lifestyle changes)  -Current home glucose readings fasting glucose: 114 post prandial glucose:  -Denies hypoglycemic/hyperglycemic symptoms -He does reports some GI issues (diarrhea) with metformin.  Discussed switching to ER and patient was agreeable, however he wanted to finish out his current supply before making this switch.  He will mention to Dr. when he comes in for physical. -Educated onA1c and blood sugar goals; Benefits of routine self-monitoring of blood sugar; -Counseled to check feet daily and get yearly eye exams -Recommended to continue current medication Counseled on lesser GI effects seen with XR version  Update 09/04/2021 135-150 AM fasting blood sugars recently He continues to take 2000mg  of Metformin daily He did express to me that he is still experiencing GI  issues with metformin.  In the past we have discussed switching to the XR version of metformin to try and alleviate some of this.  Will consult with PCP on switch.  Would like refill called in to on file pharmacy if approved.  Depression/Anxiety (Goal: Minimize symptoms) -controlled -Current treatment: Trazodone 50mg  daily Sertraline 100mg  -Medications previously  tried/failed: effexor -Reports sleep has improved since starting Trazodone. -Discussed range of doses he could use (25mg -100mg ) he is currently taking 50mg  and controlled. -Educated on Benefits of medication for symptom control -Recommended to continue current medication  Hypothyroidism (Goal: Maintain TSH) -Not ideally controlled - last Tsh was elevated -Current treatment  Levothyroxine daily -Medications previously tried: none noted -Reminded patient to come get fasting labs - have not been done in close to one year.  -Recommended to continue current medication FU on labs at next check in call.    Patient Goals/Self-Care Activities Over the next 180 days, patient will:  - take medications as prescribed check glucose daily, document, and provide at future appointments notify providers to switch to XR version of Metformin  Follow Up Plan: The care management team will reach out to the patient again over the next 180 days.             Patient verbalizes understanding of instructions provided today and agrees to view in MyChart.  Telephone follow up appointment with pharmacy team member scheduled for: 6 months  ,  

## 2021-09-09 ENCOUNTER — Other Ambulatory Visit: Payer: Self-pay | Admitting: Family Medicine

## 2021-09-09 ENCOUNTER — Telehealth: Payer: Self-pay | Admitting: Pharmacist

## 2021-09-09 DIAGNOSIS — E119 Type 2 diabetes mellitus without complications: Secondary | ICD-10-CM

## 2021-09-09 MED ORDER — METFORMIN HCL ER 500 MG PO TB24: 1000.0000 mg | ORAL_TABLET | Freq: Two times a day (BID) | ORAL | 3 refills | Status: DC

## 2021-09-09 NOTE — Telephone Encounter (Signed)
-----   Message from Donita Brooks, MD sent at 09/05/2021  4:28 PM EST ----- I am fine switching to xr metformin.

## 2021-09-09 NOTE — Chronic Care Management (AMB) (Signed)
Rx called in for XR metformin.  Patient to continue current dose.

## 2021-09-10 ENCOUNTER — Other Ambulatory Visit: Payer: Self-pay | Admitting: Family Medicine

## 2021-09-24 DIAGNOSIS — I10 Essential (primary) hypertension: Secondary | ICD-10-CM | POA: Diagnosis not present

## 2021-09-24 DIAGNOSIS — E1159 Type 2 diabetes mellitus with other circulatory complications: Secondary | ICD-10-CM | POA: Diagnosis not present

## 2021-09-24 DIAGNOSIS — F32A Depression, unspecified: Secondary | ICD-10-CM

## 2021-09-24 DIAGNOSIS — Z7984 Long term (current) use of oral hypoglycemic drugs: Secondary | ICD-10-CM

## 2021-09-24 DIAGNOSIS — E039 Hypothyroidism, unspecified: Secondary | ICD-10-CM

## 2021-09-24 DIAGNOSIS — E785 Hyperlipidemia, unspecified: Secondary | ICD-10-CM | POA: Diagnosis not present

## 2021-10-13 ENCOUNTER — Other Ambulatory Visit: Payer: Self-pay

## 2021-10-13 MED ORDER — TRAZODONE HCL 50 MG PO TABS: ORAL_TABLET | ORAL | 2 refills | Status: DC

## 2021-10-14 DIAGNOSIS — H6502 Acute serous otitis media, left ear: Secondary | ICD-10-CM | POA: Diagnosis not present

## 2021-10-14 DIAGNOSIS — H903 Sensorineural hearing loss, bilateral: Secondary | ICD-10-CM | POA: Diagnosis not present

## 2021-10-14 DIAGNOSIS — H6522 Chronic serous otitis media, left ear: Secondary | ICD-10-CM | POA: Insufficient documentation

## 2021-10-23 ENCOUNTER — Telehealth: Payer: Self-pay | Admitting: Family Medicine

## 2021-10-23 NOTE — Telephone Encounter (Signed)
Left message for patient to call back and schedule Medicare Annual Wellness Visit (AWV) in office.  ° °If not able to come in office, please offer to do virtually or by telephone.  Left office number and my jabber #[phone]. ° °Due for AWVI ° °Please schedule at anytime with Nurse Health Advisor. °  °

## 2021-10-30 ENCOUNTER — Other Ambulatory Visit: Payer: Self-pay | Admitting: Family Medicine

## 2021-10-30 NOTE — Telephone Encounter (Signed)
Please advise 

## 2021-11-05 DIAGNOSIS — H903 Sensorineural hearing loss, bilateral: Secondary | ICD-10-CM | POA: Diagnosis not present

## 2021-11-05 DIAGNOSIS — H6522 Chronic serous otitis media, left ear: Secondary | ICD-10-CM | POA: Diagnosis not present

## 2021-11-18 ENCOUNTER — Telehealth: Payer: Self-pay | Admitting: Family Medicine

## 2021-11-18 NOTE — Telephone Encounter (Signed)
Left message for patient to call back and schedule Medicare Annual Wellness Visit (AWV) in office.  ° °If not able to come in office, please offer to do virtually or by telephone.  Left office number and my jabber #[phone]. ° °Due for AWVI ° °Please schedule at anytime with Nurse Health Advisor. °  °

## 2021-11-28 ENCOUNTER — Telehealth: Payer: Self-pay | Admitting: Pharmacist

## 2021-11-28 NOTE — Progress Notes (Signed)
° ° °  Chronic Care Management Pharmacy Assistant   Name: Shaun Gomez  MRN: [id number] DOB: 1951/06/23   Reason for Encounter: Disease State - Diabetes Call     Recent office visits:  None noted.   Recent consult visits:  10/14/21 Scot Jun, PA-C - Acute serous otitis media - Prednisone 10 mg 6-day taper, Fluticasone nasal spray daily, and Valsalva maneuvers prescribed. Follow up with Dr Jenne Pane 2-3 weeks.    Hospital visits:  None in previous 6 months  Medications: Outpatient Encounter Medications as of 11/28/2021  Medication Sig   aspirin 325 MG tablet Take 325 mg by mouth daily.   latanoprost (XALATAN) 0.005 % ophthalmic solution Place 1 drop into both eyes at bedtime.    levothyroxine (SYNTHROID) 150 MCG tablet TAKE 1 TABLET BY MOUTH EVERY DAY   lisinopril (ZESTRIL) 20 MG tablet Take 1 tablet (20 mg total) by mouth daily.   metFORMIN (GLUCOPHAGE-XR) 500 MG 24 hr tablet TAKE 2 TABLETS BY MOUTH TWICE A DAY   Multiple Vitamin (MULTIVITAMIN) tablet Take 1 tablet by mouth daily.   sertraline (ZOLOFT) 100 MG tablet TAKE 1 TABLET BY MOUTH EVERY DAY   timolol (TIMOPTIC) 0.25 % ophthalmic solution 1 drop 2 (two) times daily.   traZODone (DESYREL) 50 MG tablet TAKE 2 TABLETS BY MOUTH AT BEDTIME AS NEEDED FOR SLEEP.   No facility-administered encounter medications on file as of 11/28/2021.   Current antihyperglycemic regimen:  Metformin 500mg  two tablets twice daily   What recent interventions/DTPs have been made to improve glycemic control:  Patient reported   Have there been any recent hospitalizations or ED visits since last visit with CPP?  Patient has not had any ED visits or hospitalizations since last visit with CPP.   Patient  hypoglycemic symptoms, including    Patient  hyperglycemic symptoms, including    How often are you checking your blood sugar? Patient reported   What are your blood sugars ranging?  Fasting:  Before meals:  After meals:   Bedtime:    During the week, how often does your blood glucose drop below 70?  Patient reported  Are you checking your feet daily/regularly? Patient reported    Adherence Review: Is the patient currently on a STATIN medication? No Is the patient currently on ACE/ARB medication? Yes Does the patient have >5 day gap between last estimated fill dates? No   Care Gaps  AWV: done 11/17/21 Colonoscopy: never  DM Eye Exam: due 03/26/22 DM Foot Exam: due 10/08/18 Microalbumin: done 06/10/20 HbgAIC: done 06/10/20 (6.4) DEXA: N/A Mammogram: N/A   Star Rating Drugs: metFORMIN (GLUCOPHAGE-XR) 500 MG 24 hr tablet - last filled 09/10/21 90 days  lisinopril (ZESTRIL) 20 MG tablet - last filled 10/05/21 90 days   Future Appointments  Date Time Provider Department Center  03/12/2022  2:00 PM [facility] PHARMACIST [facility] None    Multiple attempts were made to contact patient. Attempts were unsuccessful. / ls,CMA   03/14/2022, [facility] Clinical Pharmacist Assistant  [phone]

## 2021-12-03 ENCOUNTER — Telehealth: Payer: Self-pay | Admitting: Family Medicine

## 2021-12-03 NOTE — Telephone Encounter (Signed)
Left message for patient to call back and schedule Medicare Annual Wellness Visit (AWV) in office.  ° °If not able to come in office, please offer to do virtually or by telephone.  Left office number and my jabber #[phone]. ° °Due for AWVI ° °Please schedule at anytime with Nurse Health Advisor. °  °

## 2021-12-05 ENCOUNTER — Encounter: Payer: Self-pay | Admitting: Family Medicine

## 2021-12-05 DIAGNOSIS — G4733 Obstructive sleep apnea (adult) (pediatric): Secondary | ICD-10-CM

## 2021-12-05 DIAGNOSIS — Z9989 Dependence on other enabling machines and devices: Secondary | ICD-10-CM

## 2021-12-23 ENCOUNTER — Encounter: Payer: Self-pay | Admitting: Family Medicine

## 2021-12-23 ENCOUNTER — Ambulatory Visit (INDEPENDENT_AMBULATORY_CARE_PROVIDER_SITE_OTHER): Payer: Medicare Other | Admitting: Family Medicine

## 2021-12-23 ENCOUNTER — Other Ambulatory Visit: Payer: Self-pay

## 2021-12-23 VITALS — BP 128/72 | HR 70 | Temp 97.5°F | Resp 18 | Ht 66.0 in | Wt 206.0 lb

## 2021-12-23 DIAGNOSIS — H903 Sensorineural hearing loss, bilateral: Secondary | ICD-10-CM

## 2021-12-23 DIAGNOSIS — E039 Hypothyroidism, unspecified: Secondary | ICD-10-CM | POA: Diagnosis not present

## 2021-12-23 DIAGNOSIS — Z9989 Dependence on other enabling machines and devices: Secondary | ICD-10-CM | POA: Diagnosis not present

## 2021-12-23 DIAGNOSIS — E119 Type 2 diabetes mellitus without complications: Secondary | ICD-10-CM

## 2021-12-23 DIAGNOSIS — G4733 Obstructive sleep apnea (adult) (pediatric): Secondary | ICD-10-CM | POA: Diagnosis not present

## 2021-12-23 MED ORDER — LEVOCETIRIZINE DIHYDROCHLORIDE 5 MG PO TABS: 5.0000 mg | ORAL_TABLET | Freq: Every evening | ORAL | 3 refills | Status: AC

## 2021-12-23 NOTE — Progress Notes (Signed)
Subjective:    Patient ID: Shaun Gomez, male    DOB: 15-Feb-1951, 71 y.o.   MRN: [id number] Patient is long overdue for fasting lab work.  However he presents today complaining of bilateral hearing loss.  He essentially is unable to hear out of his right ear.  He has markedly diminished hearing in his left ear.  He has seen ENT on 2 separate occasions and had audiograms performed that showed sensorineural hearing loss.  They have recommended hearing aids.  He also had an MRI obtained of the brain which showed no retrocochlear lesions but it did show a small remote left parietal infarct.  Patient has tried an over-the-counter hearing aid but is having little success without.  He is scheduled to meet with the audiologist to be evaluated for hearing aids soon.  He also reports occasional epistaxis coming from his right nostril.  On examination there is a small punctate area of bleeding seen on the anterior right nasal septum.  This appears to be due to an irritated capillary.  He wears CPAP every night.  He is unable to sleep without it.  However he is using a humidifier.  He does report nasal congestion and postnasal drip and is occasionally blowing out brown and green mucus from his nostrils.  He denies any sinus pain or sinus pressure. Past Medical History:  Diagnosis Date   CAD (coronary artery disease)    Depression 07/1993   Diabetes mellitus without complication (HCC)    Hyperlipidemia 02/1996   Hypertension pre 08/25/1996   OSA on CPAP    Thyroid disease 07/1993   Hypothyroidism   Past Surgical History:  Procedure Laterality Date   APPENDECTOMY  2 YOA   CATARACT EXTRACTION, BILATERAL     CORONARY ARTERY BYPASS GRAFT  02/1996   X 2 EF 30%   MI S/P Caverject Injection  02/1996   RETINAL DETACHMENT SURGERY     Sleep Study  01/03/2006   Severe sleep apnea - 88 events per hour )2 sats down to 50% ?   Stress Cardiolite  03/1996   EF 30%   Current Outpatient Medications on File Prior to  Visit  Medication Sig Dispense Refill   aspirin 325 MG tablet Take 325 mg by mouth daily.     latanoprost (XALATAN) 0.005 % ophthalmic solution Place 1 drop into both eyes at bedtime.      levothyroxine (SYNTHROID) 150 MCG tablet TAKE 1 TABLET BY MOUTH EVERY DAY 90 tablet 3   lisinopril (ZESTRIL) 20 MG tablet Take 1 tablet (20 mg total) by mouth daily. 90 tablet 3   metFORMIN (GLUCOPHAGE-XR) 500 MG 24 hr tablet TAKE 2 TABLETS BY MOUTH TWICE A DAY 360 tablet 3   Multiple Vitamin (MULTIVITAMIN) tablet Take 1 tablet by mouth daily.     sertraline (ZOLOFT) 100 MG tablet TAKE 1 TABLET BY MOUTH EVERY DAY 90 tablet 2   timolol (TIMOPTIC) 0.25 % ophthalmic solution 1 drop 2 (two) times daily.     traZODone (DESYREL) 50 MG tablet TAKE 2 TABLETS BY MOUTH AT BEDTIME AS NEEDED FOR SLEEP. 180 tablet 2   No current facility-administered medications on file prior to visit.   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  Tobacco Use   Smoking status: Former    Packs/day: 2.00    Years: 15.00    Pack years: 30.00    Types: Cigarettes   Smokeless tobacco: Never   Tobacco comments:    Quit 1983  Substance and Sexual Activity   Alcohol use: Yes    Comment: case of beer    Drug use: Not on file   Sexual activity: Not on file  Other Topics Concern   Not on file  Social History Narrative   Re-married, lives with wife   2 step-children, 2 granddaughters   Working at [employer] in [employer], Materials engineer   Social Determinants of Corporate investment banker Strain: Not on file  Food Insecurity: Not on file   Transportation Needs: Not on file  Physical Activity: Not on file  Stress: Not on file  Social Connections: Not on file  Intimate Partner Violence: Not on file   Family History  Problem Relation Age of Onset   Hypertension Mother    Parkinsonism Mother    Dementia Mother    Diabetes Mother    Stroke Father 75   Depression Sister    Anxiety disorder Sister    Heart disease Brother 31       CAD   Hyperlipidemia Brother    Hypertension Brother    Arthritis Neg Hx    Cancer Neg Hx    Alcohol abuse Neg Hx    Drug abuse Neg Hx    Colon cancer Neg Hx    Prostate cancer Neg Hx    Heart disease Brother 49       CAD      Review of Systems  All other systems reviewed and are negative.     Objective:   Physical Exam Vitals reviewed.  Constitutional:      General: He is not in acute distress.    Appearance: He is well-developed. He is not diaphoretic.  HENT:     Head: Normocephalic and atraumatic.     Right Ear: External ear normal.     Left Ear: External ear normal.     Nose: Congestion and rhinorrhea present.     Right Nostril: Epistaxis present.     Right Sinus: No maxillary sinus tenderness or frontal sinus tenderness.     Left Sinus: No maxillary sinus tenderness or frontal sinus tenderness.     Mouth/Throat:     Pharynx: No oropharyngeal exudate.  Eyes:     General: No scleral icterus.    Conjunctiva/sclera: Conjunctivae normal.     Pupils: Pupils are equal, round, and reactive to light.  Neck:     Thyroid: No thyromegaly.     Vascular: No JVD.  Cardiovascular:     Rate and Rhythm: Normal rate and regular rhythm.     Heart sounds: Normal heart sounds. No murmur heard. Pulmonary:     Effort: Pulmonary effort is normal. No respiratory distress.     Breath sounds: No stridor. No wheezing, rhonchi or rales.  Abdominal:     General: Bowel sounds are normal. There is no distension.     Palpations: Abdomen is soft.     Tenderness: There is no abdominal  tenderness. There is no guarding or rebound.  Musculoskeletal:     Cervical back: Neck supple.  Lymphadenopathy:  Cervical: No cervical adenopathy.  Neurological:     Mental Status: He is alert and oriented to person, place, and time.     Cranial Nerves: No cranial nerve deficit.     Motor: No abnormal muscle tone.     Coordination: Coordination normal.     Deep Tendon Reflexes: Reflexes are normal and symmetric.         Assessment & Plan:  Type 2 diabetes mellitus without complication, without long-term current use of insulin (HCC) - Plan: CBC with Differential/Platelet, Lipid panel, Microalbumin, urine, COMPLETE METABOLIC PANEL WITH GFR, Hemoglobin A1c  Hypothyroidism, unspecified type - Plan: TSH  OSA on CPAP  Bilateral sensorineural hearing loss  Regarding the hearing loss, I believe that the patient has bilateral sensorineural hearing loss right greater than left due to presbycusis and his longtime career as a Licensed conveyancer.  I recommended follow-up with audiology to be evaluated for hearing aids.  I reviewed the MRI with him in detail.  We did discuss the small left parietal infarct and the recommended risk factor reduction.  This includes controlling his blood pressure.  This is excellent today 128/72.  However he is long overdue to check his cholesterol and his diabetes.  While checking fasting lab work I will also like to check his thyroid.  Regarding the epistaxis and nasal congestion, I see no evidence of sinus infection.  I recommended trying Xyzal 5 mg a day for chronic sinusitis.  Regarding his obstructive sleep apnea, patient would like to follow-up with a sleep specialist.  I will gladly set the patient up to see neurology.  He has been diagnosed with obstructive sleep apnea years ago before he became a patient has been wearing CPAP nightly for more than 8 hours for years.

## 2021-12-24 LAB — CBC WITH DIFFERENTIAL/PLATELET
Absolute Monocytes: 1591 cells/uL — ABNORMAL HIGH (ref 200–950)
Basophils Absolute: 57 cells/uL (ref 0–200)
Basophils Relative: 0.7 %
Eosinophils Absolute: 238 cells/uL (ref 15–500)
Eosinophils Relative: 2.9 %
HCT: 42.4 % (ref 38.5–50.0)
Hemoglobin: 14.7 g/dL (ref 13.2–17.1)
Lymphs Abs: 2952 cells/uL (ref 850–3900)
MCH: 35.8 pg — ABNORMAL HIGH (ref 27.0–33.0)
MCHC: 34.7 g/dL (ref 32.0–36.0)
MCV: 103.2 fL — ABNORMAL HIGH (ref 80.0–100.0)
MPV: 10.6 fL (ref 7.5–12.5)
Monocytes Relative: 19.4 %
Neutro Abs: 3362 cells/uL (ref 1500–7800)
Neutrophils Relative %: 41 %
Platelets: 227 10*3/uL (ref 140–400)
RBC: 4.11 10*6/uL — ABNORMAL LOW (ref 4.20–5.80)
RDW: 12.2 % (ref 11.0–15.0)
Total Lymphocyte: 36 %
WBC: 8.2 10*3/uL (ref 3.8–10.8)

## 2021-12-24 LAB — LIPID PANEL
Cholesterol: 164 mg/dL (ref <200)
HDL: 59 mg/dL (ref >=40)
LDL Cholesterol (Calc): 80 mg/dL (calc)
Non-HDL Cholesterol (Calc): 105 mg/dL (calc) (ref <130)
Total CHOL/HDL Ratio: 2.8 (calc) (ref <5.0)
Triglycerides: 145 mg/dL (ref <150)

## 2021-12-24 LAB — COMPLETE METABOLIC PANEL WITH GFR
AG Ratio: 1.1 (calc) (ref 1.0–2.5)
ALT: 58 U/L — ABNORMAL HIGH (ref 9–46)
AST: 50 U/L — ABNORMAL HIGH (ref 10–35)
Albumin: 4.1 g/dL (ref 3.6–5.1)
Alkaline phosphatase (APISO): 108 U/L (ref 35–144)
BUN/Creatinine Ratio: 17 (calc) (ref 6–22)
BUN: 27 mg/dL — ABNORMAL HIGH (ref 7–25)
CO2: 27 mmol/L (ref 20–32)
Calcium: 10.1 mg/dL (ref 8.6–10.3)
Chloride: 100 mmol/L (ref 98–110)
Creat: 1.58 mg/dL — ABNORMAL HIGH (ref 0.70–1.28)
Globulin: 3.7 g/dL (calc) (ref 1.9–3.7)
Glucose, Bld: 72 mg/dL (ref 65–99)
Potassium: 4.6 mmol/L (ref 3.5–5.3)
Sodium: 138 mmol/L (ref 135–146)
Total Bilirubin: 0.3 mg/dL (ref 0.2–1.2)
Total Protein: 7.8 g/dL (ref 6.1–8.1)
eGFR: 46 mL/min/{1.73_m2} — ABNORMAL LOW (ref >=60)

## 2021-12-24 LAB — HEMOGLOBIN A1C
Hgb A1c MFr Bld: 6.5 % of total Hgb — ABNORMAL HIGH (ref <5.7)
Mean Plasma Glucose: 140 mg/dL
eAG (mmol/L): 7.7 mmol/L

## 2021-12-24 LAB — MICROALBUMIN, URINE: Microalb, Ur: 2 mg/dL

## 2021-12-24 LAB — TSH: TSH: 12.17 mIU/L — ABNORMAL HIGH (ref 0.40–4.50)

## 2021-12-25 ENCOUNTER — Other Ambulatory Visit: Payer: Self-pay

## 2021-12-25 ENCOUNTER — Ambulatory Visit: Payer: Medicare Other

## 2021-12-25 ENCOUNTER — Telehealth: Payer: Self-pay

## 2021-12-25 NOTE — Telephone Encounter (Signed)
Pt was scheduled for AWV today. Unable to reach by phone. LM for pt to call office to do appointment but no return call. Thank you!! ?

## 2021-12-26 NOTE — Telephone Encounter (Signed)
Patient said he doesn't want to do the AWV. He said Dr.Pickard just saw him and he got all of his information. I tried to explain the benefits, but he said he wasn't interested. ?

## 2021-12-30 DIAGNOSIS — H401112 Primary open-angle glaucoma, right eye, moderate stage: Secondary | ICD-10-CM | POA: Diagnosis not present

## 2021-12-30 DIAGNOSIS — H401122 Primary open-angle glaucoma, left eye, moderate stage: Secondary | ICD-10-CM | POA: Diagnosis not present

## 2021-12-31 ENCOUNTER — Other Ambulatory Visit: Payer: Self-pay | Admitting: Family Medicine

## 2021-12-31 MED ORDER — DAPAGLIFLOZIN PROPANEDIOL 10 MG PO TABS: 10.0000 mg | ORAL_TABLET | Freq: Every day | ORAL | 5 refills | Status: AC

## 2022-01-12 ENCOUNTER — Telehealth: Payer: Self-pay | Admitting: Pharmacist

## 2022-01-12 NOTE — Progress Notes (Signed)
? ? ?  Chronic Care Management ?Pharmacy Assistant  ? ?Name: NABIL BUBOLZ  MRN: [id number] DOB: 14-May-1951 ? ? ?Reason for Encounter: Disease State - Diabetes Call  ?  ? ?Recent office visits:  ?12/23/21 Lynnea Ferrier, MD - Family Medicine - Diabetes - Labs ordered. Referral for Sleep Study placed. Levocetirizine (XYZAL ALLERGY 24HR) 5 MG tablet prescribed. Follow up as scheduled.  ? ?12/03/21 Annual Medicare Wellness Completed. ? ? ?Recent consult visits:  ?None noted.  ? ?Hospital visits:  ?None in previous 6 months ? ?Medications: ?Outpatient Encounter Medications as of 01/12/2022  ?Medication Sig  ? aspirin 325 MG tablet Take 325 mg by mouth daily.  ? dapagliflozin propanediol (FARXIGA) 10 MG TABS tablet Take 1 tablet (10 mg total) by mouth daily before breakfast.  ? latanoprost (XALATAN) 0.005 % ophthalmic solution Place 1 drop into both eyes at bedtime.   ? levocetirizine (XYZAL ALLERGY 24HR) 5 MG tablet Take 1 tablet (5 mg total) by mouth every evening.  ? levothyroxine (SYNTHROID) 150 MCG tablet TAKE 1 TABLET BY MOUTH EVERY DAY  ? lisinopril (ZESTRIL) 20 MG tablet Take 1 tablet (20 mg total) by mouth daily.  ? metFORMIN (GLUCOPHAGE-XR) 500 MG 24 hr tablet TAKE 2 TABLETS BY MOUTH TWICE A DAY  ? Multiple Vitamin (MULTIVITAMIN) tablet Take 1 tablet by mouth daily.  ? sertraline (ZOLOFT) 100 MG tablet TAKE 1 TABLET BY MOUTH EVERY DAY  ? timolol (TIMOPTIC) 0.25 % ophthalmic solution 1 drop 2 (two) times daily.  ? traZODone (DESYREL) 50 MG tablet TAKE 2 TABLETS BY MOUTH AT BEDTIME AS NEEDED FOR SLEEP.  ? ?No facility-administered encounter medications on file as of 01/12/2022.  ? ? ?Current antihyperglycemic regimen:  ? Metformin 500mg  two tablets twice daily ? ? ?What recent interventions/DTPs have been made to improve glycemic control:  ? ? ?Have there been any recent hospitalizations or ED visits since last visit with CPP?  ?Patient has not had any ED visits or hospitalizations since last visit with CPP.  ? ?Patient   hypoglycemic symptoms, including  ? ? ?Patient  hyperglycemic symptoms, including ? ? ?How often are you checking your blood sugar? ? ?  ?What are your blood sugars ranging?  ?Fasting:  ?Before meals:  ?After meals:  ?Bedtime:  ? ?During the week, how often does your blood glucose drop below 70? ? Patient reported ? ?Are you checking your feet daily/regularly? ?Patient reported ?  ? ?Adherence Review: ?Is the patient currently on a STATIN medication? Yes ?Is the patient currently on ACE/ARB medication? No ?Does the patient have >5 day gap between last estimated fill dates? No ? ? ?Care Gaps ?  ?AWV: done 11/17/21 ?Colonoscopy: never  ?DM Eye Exam: due 03/26/22 ?DM Foot Exam: due 10/08/18 ?Microalbumin: done 06/10/20 ?HbgAIC: done 06/10/20 (6.4) ?DEXA: N/A ?Mammogram: N/A ? ? ?Star Rating Drugs: ?lisinopril (ZESTRIL) 20 MG tablet - last filled 10/05/21 90 days  ?metFORMIN (GLUCOPHAGE-XR) 500 MG 24 hr tablet - last filled 12/13/21 90 days  ?dapagliflozin propanediol (FARXIGA) 10 MG TABS tablet - last fill unavailable (? Samples or PAP) ? ? ?Future Appointments  ?Date Time Provider Department Center  ?06/04/2022 11:00 AM [facility] PHARMACIST [facility]  ? ?Multiple attempts were made to contact patient. Attempts were unsuccessful. / ls,CMA  ? ? ?Liza Showfety, CCMA ?Clinical Pharmacist Assistant  ?[phone] ? ? ?

## 2022-02-10 ENCOUNTER — Encounter: Payer: Self-pay | Admitting: Family Medicine

## 2022-02-10 MED ORDER — LEVOTHYROXINE SODIUM 175 MCG PO TABS: 175.0000 ug | ORAL_TABLET | Freq: Every day | ORAL | 3 refills | Status: DC

## 2022-03-06 ENCOUNTER — Telehealth: Payer: Self-pay | Admitting: Pharmacist

## 2022-03-06 DIAGNOSIS — H9212 Otorrhea, left ear: Secondary | ICD-10-CM | POA: Insufficient documentation

## 2022-03-06 DIAGNOSIS — H6522 Chronic serous otitis media, left ear: Secondary | ICD-10-CM | POA: Diagnosis not present

## 2022-03-06 NOTE — Progress Notes (Signed)
    Chronic Care Management Pharmacy Assistant   Name: Shaun Gomez  MRN: [id number] DOB: 1951/08/30   Reason for Encounter: Disease State - Diabetes Call     Recent office visits:  None noted.   Recent consult visits:  None noted.  Hospital visits:  None in previous 6 months  Medications: Outpatient Encounter Medications as of 03/06/2022  Medication Sig   aspirin 325 MG tablet Take 325 mg by mouth daily.   dapagliflozin propanediol (FARXIGA) 10 MG TABS tablet Take 1 tablet (10 mg total) by mouth daily before breakfast.   latanoprost (XALATAN) 0.005 % ophthalmic solution Place 1 drop into both eyes at bedtime.    levocetirizine (XYZAL ALLERGY 24HR) 5 MG tablet Take 1 tablet (5 mg total) by mouth every evening.   levothyroxine (SYNTHROID) 175 MCG tablet Take 1 tablet (175 mcg total) by mouth daily.   lisinopril (ZESTRIL) 20 MG tablet Take 1 tablet (20 mg total) by mouth daily.   metFORMIN (GLUCOPHAGE-XR) 500 MG 24 hr tablet TAKE 2 TABLETS BY MOUTH TWICE A DAY   Multiple Vitamin (MULTIVITAMIN) tablet Take 1 tablet by mouth daily.   sertraline (ZOLOFT) 100 MG tablet TAKE 1 TABLET BY MOUTH EVERY DAY   timolol (TIMOPTIC) 0.25 % ophthalmic solution 1 drop 2 (two) times daily.   traZODone (DESYREL) 50 MG tablet TAKE 2 TABLETS BY MOUTH AT BEDTIME AS NEEDED FOR SLEEP.   No facility-administered encounter medications on file as of 03/06/2022.    Current antihyperglycemic regimen:  Metformin 500mg  two tablets twice daily   What recent interventions/DTPs have been made to improve glycemic control:  Patient reported  Have there been any recent hospitalizations or ED visits since last visit with CPP?  Patient has not had any ED visits or hospitalizations since last visit with CPP.   Patient  hypoglycemic symptoms, including    Patient  hyperglycemic symptoms, including    How often are you checking your blood sugar? Patient reported   What are your blood sugars ranging?   Fasting:  Before meals:  After meals:  Bedtime:   During the week, how often does your blood glucose drop below 70?  Patient reported  Are you checking your feet daily/regularly? Patient reported    Adherence Review: Is the patient currently on a STATIN medication? No Is the patient currently on ACE/ARB medication? Yes Does the patient have >5 day gap between last estimated fill dates? No   Care Gaps   AWV: done 11/17/21 Colonoscopy: never  DM Eye Exam: due 03/26/22 DM Foot Exam: due 10/08/18 Microalbumin: done 06/10/20 HbgAIC: done 06/10/20 (6.4) DEXA: N/A Mammogram: N/A     Star Rating Drugs: Lisinopril (ZESTRIL) 20 MG tablet - last filled 12/31/21 90 days  Metformin (GLUCOPHAGE-XR) 500 MG 24 hr tablet - last filled 12/13/21 90 days  Dapagliflozin propanediol (FARXIGA) 10 MG TABS tablet - last fill unavailable (? Samples or PAP)   Future Appointments  Date Time Provider [place]  06/04/2022 11:00 AM [facility] PHARMACIST [facility]   Multiple attempts were made to contact patient. Attempts were unsuccessful. / ls,CMA   Jobe Gibbon, [place] Pharmacist Assistant  [phone]

## 2022-03-12 ENCOUNTER — Telehealth: Payer: Medicare Other

## 2022-04-09 ENCOUNTER — Telehealth: Payer: Self-pay | Admitting: Pharmacist

## 2022-04-09 NOTE — Progress Notes (Signed)
    Chronic Care Management Pharmacy Assistant   Name: Shaun Gomez  MRN: [id number] DOB: 08/23/1951   Reason for Encounter: Disease State - Diabetes Call     Recent office visits:  None noted.   Recent consult visits:  03/06/22 Christia Reading MD - Otolaryngology - Otorrhea left ear - Cipro and Decadron drops prescribed. Follow up as needed.   Hospital visits:  None in previous 6 months  Medications: Outpatient Encounter Medications as of 04/09/2022  Medication Sig   aspirin 325 MG tablet Take 325 mg by mouth daily.   dapagliflozin propanediol (FARXIGA) 10 MG TABS tablet Take 1 tablet (10 mg total) by mouth daily before breakfast.   latanoprost (XALATAN) 0.005 % ophthalmic solution Place 1 drop into both eyes at bedtime.    levocetirizine (XYZAL ALLERGY 24HR) 5 MG tablet Take 1 tablet (5 mg total) by mouth every evening.   levothyroxine (SYNTHROID) 175 MCG tablet Take 1 tablet (175 mcg total) by mouth daily.   lisinopril (ZESTRIL) 20 MG tablet Take 1 tablet (20 mg total) by mouth daily.   metFORMIN (GLUCOPHAGE-XR) 500 MG 24 hr tablet TAKE 2 TABLETS BY MOUTH TWICE A DAY   Multiple Vitamin (MULTIVITAMIN) tablet Take 1 tablet by mouth daily.   sertraline (ZOLOFT) 100 MG tablet TAKE 1 TABLET BY MOUTH EVERY DAY   timolol (TIMOPTIC) 0.25 % ophthalmic solution 1 drop 2 (two) times daily.   traZODone (DESYREL) 50 MG tablet TAKE 2 TABLETS BY MOUTH AT BEDTIME AS NEEDED FOR SLEEP.   No facility-administered encounter medications on file as of 04/09/2022.   Current antihyperglycemic regimen:  Metformin 500mg  two tablets twice daily   What recent interventions/DTPs have been made to improve glycemic control:  Patient denied any recent changes in his current medication regimen.   Have there been any recent hospitalizations or ED visits since last visit with CPP?  Patient has not had any ED visits or hospitalizations since last visit with CPP.   Patient denies hypoglycemic symptoms,  including Pale, Sweaty, Shaky, Hungry, Nervous/irritable, and Vision changes   Patient denies hyperglycemic symptoms, including blurry vision, excessive thirst, fatigue, polyuria, and weakness   How often are you checking your blood sugar? Patient reported checking blood sugars once a week.    What are your blood sugars ranging?  Fasting: 140 Before meals:  After meals:  Bedtime:   During the week, how often does your blood glucose drop below 70?  Patient denied any readings of 70 or below.  Are you checking your feet daily/regularly? Patient reported checking his feet regularly and denied any current concerns.    Adherence Review: Is the patient currently on a STATIN medication? No Is the patient currently on ACE/ARB medication? Yes Does the patient have >5 day gap between last estimated fill dates? No   Care Gaps   AWV: done 11/17/21 Colonoscopy: never  DM Eye Exam: due 03/26/22 DM Foot Exam: due 10/08/18 Microalbumin: done 12/23/21 HbgAIC: done 12/23/21 (6.5) DEXA: N/A Mammogram: N/A     Star Rating Drugs: Lisinopril (ZESTRIL) 20 MG tablet - last filled 12/31/21 90 days  Metformin (GLUCOPHAGE-XR) 500 MG 24 hr tablet - last filled 03/20/22 90 days  Dapagliflozin propanediol (FARXIGA) 10 MG TABS tablet - last fill unavailable (? Samples or PAP)   Future Appointments  Date Time Provider Department Center  06/04/2022 11:00 AM [facility] PHARMACIST [facility]     08/04/2022, [facility] Clinical Pharmacist Assistant  [phone]

## 2022-05-07 ENCOUNTER — Other Ambulatory Visit: Payer: Self-pay | Admitting: Family Medicine

## 2022-05-07 NOTE — Telephone Encounter (Signed)
Requested Prescriptions  Pending Prescriptions Disp Refills  . lisinopril (ZESTRIL) 20 MG tablet [Pharmacy Med Name: LISINOPRIL 20 MG TABLET] 90 tablet 3    Sig: TAKE 1 TABLET BY MOUTH EVERY DAY     Cardiovascular:  ACE Inhibitors Failed - 05/07/2022 10:29 AM      Failed - Cr in normal range and within 180 days    Creat  Date Value Ref Range Status  12/23/2021 1.58 (H) 0.70 - 1.28 mg/dL Final         Passed - K in normal range and within 180 days    Potassium  Date Value Ref Range Status  12/23/2021 4.6 3.5 - 5.3 mmol/L Final         Passed - Patient is not pregnant      Passed - Last BP in normal range    BP Readings from Last 1 Encounters:  12/23/21 128/72         Passed - Valid encounter within last 6 months    Recent Outpatient Visits          4 months ago Type 2 diabetes mellitus without complication, without long-term current use of insulin (HCC)   [facility] Pickard, Priscille Heidelberg, MD   9 months ago Chronic cough   [facility] Tanya Nones, Priscille Heidelberg, MD   10 months ago Upper respiratory tract infection, unspecified type   [facility] Valentino Nose, NP   1 year ago Hypothyroidism, unspecified type   [facility] Donita Brooks, MD   1 year ago Type 2 diabetes mellitus without complication, without long-term current use of insulin (HCC)   [facility] Pickard, Priscille Heidelberg, MD

## 2022-05-27 NOTE — Progress Notes (Deleted)
Chronic Care Management Pharmacy Note  06/04/2022 Name:  Shaun Gomez MRN:  [id number] DOB:  [birth year]  Subjective: Shaun Gomez is an 71 y.o. year old male who is a primary patient of Pickard, Cammie Mcgee, MD.  The CCM team was consulted for assistance with disease management and care coordination needs.    Engaged with patient by telephone for follow up visit in response to provider referral for pharmacy case management and/or care coordination services.   Consent to Services:  The patient was given the following information about Chronic Care Management services today, agreed to services, and gave verbal consent: 1. CCM service includes personalized support from designated clinical staff supervised by the primary care provider, including individualized plan of care and coordination with other care providers 2. 24/7 contact phone numbers for assistance for urgent and routine care needs. 3. Service will only be billed when office clinical staff spend 20 minutes or more in a month to coordinate care. 4. Only one practitioner may furnish and bill the service in a calendar month. 5.The patient may stop CCM services at any time (effective at the end of the month) by phone call to the office staff. 6. The patient will be responsible for cost sharing (co-pay) of up to 20% of the service fee (after annual deductible is met). Patient agreed to services and consent obtained.  Patient Care Team: Susy Frizzle, MD as PCP - General (Family Medicine) Edythe Clarity, [facility] as Pharmacist (Pharmacist)  Recent office visits: 11/30/521 Dennard Schaumann) - given trazodone for sleep, also TSH ordered and was slightly high - no change to medication dose  Recent consult visits: None since last CCM visit  Hospital visits: None in previous 6 months  Objective:  Lab Results  Component Value Date   CREATININE 1.58 (H) 12/23/2021   BUN 27 (H) 12/23/2021   GFR 87.98 02/13/2014   GFRNONAA 77 06/10/2020    GFRAA 89 06/10/2020   NA 138 12/23/2021   K 4.6 12/23/2021   CALCIUM 10.1 12/23/2021   CO2 27 12/23/2021    Lab Results  Component Value Date/Time   HGBA1C 6.5 (H) 12/23/2021 11:02 AM   HGBA1C 6.4 (H) 06/10/2020 10:27 AM   GFR 87.98 02/13/2014 04:40 PM   GFR 96.45 12/04/2013 07:55 AM   MICROALBUR 2.0 12/23/2021 11:02 AM   MICROALBUR 0.4 06/10/2020 10:27 AM    Last diabetic Eye exam:  Lab Results  Component Value Date/Time   HMDIABEYEEXA No Retinopathy 03/26/2021 09:55 AM    Last diabetic Foot exam: No results found for: "HMDIABFOOTEX"   Lab Results  Component Value Date   CHOL 164 12/23/2021   HDL 59 12/23/2021   LDLCALC 80 12/23/2021   TRIG 145 12/23/2021   CHOLHDL 2.8 12/23/2021       Latest Ref Rng & Units 12/23/2021   11:02 AM 06/10/2020   10:27 AM 11/20/2019   11:12 AM  Hepatic Function  Total Protein 6.1 - 8.1 g/dL 7.8  7.5  8.0   AST 10 - 35 U/L 50  25  84   ALT 9 - 46 U/L 58  28  115   Total Bilirubin 0.2 - 1.2 mg/dL 0.3  0.3  0.4     Lab Results  Component Value Date/Time   TSH 12.17 (H) 12/23/2021 11:02 AM   TSH 4.75 (H) 09/24/2020 11:37 AM   FREET4 0.77 08/26/2010 08:53 AM   FREET4 0.9 07/15/2009 09:50 AM       Latest Ref  Rng & Units 12/23/2021   11:02 AM 06/10/2020   10:27 AM 11/03/2019    3:06 PM  CBC  WBC 3.8 - 10.8 Thousand/uL 8.2  8.8  9.2   Hemoglobin 13.2 - 17.1 g/dL 14.7  14.3  15.2   Hematocrit 38.5 - 50.0 % 42.4  42.5  44.4   Platelets 140 - 400 Thousand/uL 227  229  191     Lab Results  Component Value Date/Time   VD25OH 34 08/26/2010 09:39 PM   VD25OH 22 (L) 07/15/2009 09:40 PM    Clinical ASCVD: Yes  The ASCVD Risk score (Arnett DK, et al., 2019) failed to calculate for the following reasons:   The patient has a prior MI or stroke diagnosis       08/04/2021    3:51 PM 11/18/2018    2:05 PM 10/08/2017    8:46 AM  Depression screen PHQ 2/9  Decreased Interest 0 2 1  Down, Depressed, Hopeless 1 1 1   PHQ - 2 Score 1 3 2    Altered sleeping 0 2 1  Tired, decreased energy 0 3 1  Change in appetite 0 0 1  Feeling bad or failure about yourself  0 1 1  Trouble concentrating 0 0 0  Moving slowly or fidgety/restless 0 0 0  Suicidal thoughts 0 0 0  PHQ-9 Score 1 9 6   Difficult doing work/chores Not difficult at all Somewhat difficult Not difficult at all      Social History   Tobacco Use  Smoking Status Former   Packs/day: 2.00   Years: 15.00   Total pack years: 30.00   Types: Cigarettes  Smokeless Tobacco Never  Tobacco Comments   Quit 1983   BP Readings from Last 3 Encounters:  12/23/21 128/72  08/04/21 130/78  09/24/20 140/80   Pulse Readings from Last 3 Encounters:  12/23/21 70  08/04/21 86  09/24/20 78   Wt Readings from Last 3 Encounters:  12/23/21 206 lb (93.4 kg)  08/04/21 205 lb (93 kg)  09/24/20 208 lb (94.3 kg)    Assessment/Interventions: Review of patient past medical history, allergies, medications, health status, including review of consultants reports, laboratory and other test data, was performed as part of comprehensive evaluation and provision of chronic care management services.   SDOH:  (Social Determinants of Health) assessments and interventions performed: No   CCM Care Plan  Allergies  Allergen Reactions   Caverject [Alprostadil]     MI after injection    Medications Reviewed Today     Reviewed by Pricilla Handler, Herman (Certified Medical Assistant) on 12/23/21 at 1010  Med List Status: <None>   Medication Order Taking? Sig Documenting Provider Last Dose Status Informant  aspirin 325 MG tablet [id number] No Take 325 mg by mouth daily. [provider] Taking Active   latanoprost (XALATAN) 0.005 % ophthalmic solution [id number] No Place 1 drop into both eyes at bedtime.  [provider] Taking Active   levothyroxine (SYNTHROID) 150 MCG tablet [id number] No TAKE 1 TABLET BY MOUTH EVERY DAY Pickard, Cammie Mcgee, MD Taking Active   lisinopril (ZESTRIL)  20 MG tablet [id number] No Take 1 tablet (20 mg total) by mouth daily. Susy Frizzle, MD Taking Active   metFORMIN (GLUCOPHAGE-XR) 500 MG 24 hr tablet [id number]  TAKE 2 TABLETS BY MOUTH TWICE A DAY Susy Frizzle, MD  Active   Multiple Vitamin (MULTIVITAMIN) tablet [id number] No Take 1 tablet by mouth daily. [provider] Taking Active   sertraline (  ZOLOFT) 100 MG tablet [id number]  TAKE 1 TABLET BY MOUTH EVERY DAY Susy Frizzle, MD  Active   timolol (TIMOPTIC) 0.25 % ophthalmic solution [id number] No 1 drop 2 (two) times daily. [provider] Taking Active   traZODone (DESYREL) 50 MG tablet [id number]  TAKE 2 TABLETS BY MOUTH AT BEDTIME AS NEEDED FOR SLEEP. Susy Frizzle, MD  Active             Patient Active Problem List   Diagnosis Date Noted   Left chronic serous otitis media 10/14/2021   Bilateral sensorineural hearing loss 06/16/2021   LLQ pain 02/13/2014   Type II or unspecified type diabetes mellitus without mention of complication, not stated as uncontrolled 12/08/2013   Pain in joint, ankle and foot 04/23/2013   OSA on CPAP    Routine general medical examination at a health care facility 03/25/2012   Depression 03/25/2012   VENTRAL HERNIA, ASYMPTOMATIC 08/13/2010   LIVER FUNCTION TESTS, ABNORMAL 11/03/2007   Hypothyroidism 02/17/2007   Hyperlipemia 02/17/2007   Essential hypertension 02/17/2007   INFARCTION, MYOCARDIAL, OLD 02/17/2007   CAD 02/17/2007    Immunization History  Administered Date(s) Administered   Td 07/26/1996, 02/18/2007   Tdap 08/15/2013    Conditions to be addressed/monitored:  HTN, CAD, Hypothyroidism, DM, HLD, Depression.    There are no care plans that you recently modified to display for this patient.      Medication Assistance: None required.  Patient affirms current coverage meets needs.  Patient's preferred pharmacy is:  [id number] [place] [place]: [phone]: [phone] [facility] [address] [address]Phone: [phone]: [phone]  Uses pill box? Yes Pt endorses 100% compliance  We discussed: Benefits of medication synchronization, packaging and delivery as well as enhanced pharmacist oversight with [employer]. Patient decided to: Continue current medication management strategy  Care Plan and Follow Up Patient Decision:  Patient agrees to Care Plan and Follow-up.  Plan: The care management team will reach out to the patient again over the next 180 days.  CBeverly Milch PharmD Clinical Pharmacist [facility] ([phone]      Current Barriers:  GI with metformin   Pharmacist Clinical Goal(s):  Over the next 180 days, patient will maintain control of blood glucose as evidenced by home monitoring, A1c  contact provider office for questions/concerns as evidenced notation of same in electronic health record Contact providers to switch to XR version of metformin through collaboration with PharmD and provider.   Interventions: 1:1 collaboration with PSusy Frizzle MD regarding development and update of comprehensive plan of care as evidenced by provider attestation and co-signature Inter-disciplinary care team collaboration (see longitudinal plan of care) Comprehensive medication review performed; medication list updated in electronic medical record  Hypertension (BP goal <130/80) -controlled -Current treatment: Lisinopril 217m-Medications previously tried: none noted  -Current home readings: not checking regularly -Denies hypotensive/hypertensive symptoms -Educated on BP goals and benefits of medications for prevention of heart attack, stroke and kidney damage; Importance of home blood  pressure monitoring; -Counseled to monitor BP at home periodically, document, and provide log at future appointments -Recommended to continue current medication  Hyperlipidemia: (LDL goal < 70) -uncontrolled -Current treatment: No medications -Medications previously tried: statins (myalgias)  -Current  dietary patterns: patient has made great dietary changes, no longer drinking alcohol -Current exercise habits:  -Educated on Cholesterol goals;  Importance of limiting foods high in cholesterol; -Recommended new lab workup at next phsyical  Diabetes (A1c goal <7%) -controlled -Current medications: Metformin 555m two tablets twice daily -Medications previously tried: glipizide (d/c due to lifestyle changes)  -Current home glucose readings fasting glucose: 114 post prandial glucose:  -Denies hypoglycemic/hyperglycemic symptoms -He does reports some GI issues (diarrhea) with metformin.  Discussed switching to ER and patient was agreeable, however he wanted to finish out his current supply before making this switch.  He will mention to Dr. PDennard Schaumannwhen he comes in for physical. -Educated onA1c and blood sugar goals; Benefits of routine self-monitoring of blood sugar; -Counseled to check feet daily and get yearly eye exams -Recommended to continue current medication Counseled on lesser GI effects seen with XR version  Update 09/04/2021 135-150 AM fasting blood sugars recently He continues to take 20060mof Metformin daily He did express to me that he is still experiencing GI issues with metformin.  In the past we have discussed switching to the XR version of metformin to try and alleviate some of this.  Will consult with PCP on switch.  Would like refill called in to on file pharmacy if approved.  Depression/Anxiety (Goal: Minimize symptoms) -controlled -Current treatment: Trazodone 5019maily Sertraline 100m42medications previously tried/failed: effexor -Reports sleep has  improved since starting Trazodone. -Discussed range of doses he could use (25mg18mmg) he is currently taking 50mg 47mcontrolled. -Educated on Benefits of medication for symptom control -Recommended to continue current medication  Hypothyroidism (Goal: Maintain TSH) -Not ideally controlled - last Tsh was elevated -Current treatment  Levothyroxine 150mcg 69my -Medications previously tried: none noted -Reminded patient to come get fasting labs - have not been done in close to one year.  -Recommended to continue current medication FU on labs at next check in call.    Patient Goals/Self-Care Activities Over the next 180 days, patient will:  - take medications as prescribed check glucose daily, document, and provide at future appointments notify providers to switch to XR version of Metformin  Follow Up Plan: The care management team will reach out to the patient again over the next 180 days.

## 2022-06-04 ENCOUNTER — Telehealth: Payer: Self-pay

## 2022-06-19 DIAGNOSIS — E119 Type 2 diabetes mellitus without complications: Secondary | ICD-10-CM | POA: Diagnosis not present

## 2022-06-19 DIAGNOSIS — H35371 Puckering of macula, right eye: Secondary | ICD-10-CM | POA: Diagnosis not present

## 2022-06-19 DIAGNOSIS — H524 Presbyopia: Secondary | ICD-10-CM | POA: Diagnosis not present

## 2022-06-19 DIAGNOSIS — H401112 Primary open-angle glaucoma, right eye, moderate stage: Secondary | ICD-10-CM | POA: Diagnosis not present

## 2022-06-19 DIAGNOSIS — H43812 Vitreous degeneration, left eye: Secondary | ICD-10-CM | POA: Diagnosis not present

## 2022-06-19 LAB — HM DIABETES EYE EXAM

## 2022-07-06 ENCOUNTER — Encounter: Payer: Self-pay | Admitting: Family Medicine

## 2022-07-13 ENCOUNTER — Ambulatory Visit (INDEPENDENT_AMBULATORY_CARE_PROVIDER_SITE_OTHER): Payer: Medicare Other | Admitting: Psychiatry

## 2022-07-13 DIAGNOSIS — Z789 Other specified health status: Secondary | ICD-10-CM

## 2022-07-13 DIAGNOSIS — F331 Major depressive disorder, recurrent, moderate: Secondary | ICD-10-CM | POA: Diagnosis not present

## 2022-07-13 DIAGNOSIS — R69 Illness, unspecified: Secondary | ICD-10-CM

## 2022-07-13 NOTE — Progress Notes (Signed)
PROBLEM-FOCUSED INITIAL PSYCHOTHERAPY EVALUATION Marliss Czar, PhD LP [facility]  Name: Shaun Gomez Date: 07/13/2022 Time spent: 55 min MRN: [id number] DOB: 21-Aug-1951 Guardian/Payee: self  PCP: Donita Brooks, MD Documentation requested on this visit: No  PROBLEM HISTORY Reason for Visit /Presenting Problem:  Chief Complaint  Patient presents with   Establish Care   Depression    Narrative/History of Present Illness Referred by PCP, Gilmore Laroche, MD, for treatment of longstanding depression.  Has seen a number of therapists, some Christian, some not.  Harder to pick up by his bootstraps than it used to be.  Started Zoloft a long time ago, current PCP raised, but not sure it's helpful.  Never been suicidal, but always under a cloud.  Low self-esteem, feeling tentative socially, flagging motivation.  One therapist wanted him to do gestalt therapy with his father and tell him off, but that never felt comfortable with that.  What he saw of F was hardworking, but saw mother push father around a lot.    Wife says she can just look at him and see when he's angry.  Typically he's not outwardly angry, much more the introvert.  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  Ended when a pastor issue drove him to leave.  Has returned to leadership, now a deacon.  Hx of early marriage losing a business, working in his own shop to make things, long career in Psychologist, forensic.  Does spend a lot of time in his shop, building furniture.  Formerly in picture framing.  Retired just before pandemic.  Current concerns -- doesn't really like who he is, doesn't feel like he's accomplished enough, worries over the state of the world/country.  Admits he has used alcohol to deal with depression in times past, but just last month stopped.  Had been in a nightly pattern, no history of blackouts, no w/d sxs present nor strong  cravings.  Has been dealing with high BP.  Age 23 had MI and 3x bypass.  No NMs nor expressed trauma.  Celibate 25 yrs now, began before the MI.  Manifested ED mid 70s, knows W [name] has felt undesirable.  Looking back, there was no particular break point for sexual relationship, more likely alcohol washed out motivation and habit became lifestyle.  Possibly   Nutritionally, will have a couple cookies at night.  On MVI.  No hx of using a B complex.  Medication incl low dose trazodone for sleep.  On Zoloft 100mg .  Has tried doubling it on his own for a night or two, but felt no difference, went back to baseline.  Hx of once trying to fade to another antidepressant, didn't work out.  Prior Psychiatric Assessment/Treatment:   Outpatient treatment: hx of both medication and therapy Psychiatric hospitalization: none stated Psychological assessment/testing: none stated   Abuse/neglect screening: Victim of abuse: No.   Victim of neglect: No.   Perpetrator of abuse/neglect: Not assessed at this time / none suspected.   Witness / Exposure to Domestic Violence: Not assessed at this time / none suspected.   Witness to Community Violence:  Not assessed at this time / none suspected.   Protective Services Involvement: No.   Report needed: No.    Substance abuse screening: Current substance abuse: No.   History of impactful substance use/abuse: Yes.  Nightly alcohol, longstanding, recently stopped   FAMILY/SOCIAL HISTORY Family of origin --  deferred Family of intention/current living situation -- wife [name].  No kids of his own, 2 stepchildren, [place] and Tammy Sours, who were 12 and 10 when they got together.  Nita Sells is lively, fun to be around.  Get along well with both.  Has grandkids now, enjoy them, too.  Faults himself for not paying enough attention to Natalia. 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  Of note for psychotherapy at this time indications of chronic care needs including sleep apnea, diabetes, glaucoma, and hearing loss Past Medical History:  Diagnosis Date   CAD (coronary artery disease)    Depression 07/1993   Diabetes mellitus without complication (HCC)    Hyperlipidemia 02/1996   Hypertension pre 08/25/1996   OSA on CPAP    Thyroid disease 07/1993   Hypothyroidism     Past Surgical History:  Procedure Laterality Date   APPENDECTOMY  2 YOA   CATARACT EXTRACTION, BILATERAL     CORONARY ARTERY BYPASS GRAFT  02/1996   X 2 EF 30%   MI S/P Caverject Injection  02/1996   RETINAL DETACHMENT SURGERY     Sleep Study  01/03/2006   Severe sleep apnea - 88 events per hour )2 sats down to 50% ?   Stress Cardiolite  03/1996   EF 30%    Allergies  Allergen Reactions   Caverject [Alprostadil]     MI after injection    Medications (as listed in Epic): Current Outpatient Medications  Medication Sig Dispense Refill   aspirin 325 MG tablet Take 325 mg by mouth daily.     dapagliflozin propanediol (FARXIGA) 10 MG TABS tablet Take 1 tablet (10 mg total) by mouth daily before breakfast. 90 tablet 5   latanoprost (XALATAN) 0.005 % ophthalmic solution Place 1 drop into both eyes at bedtime.      levocetirizine (XYZAL ALLERGY 24HR) 5 MG tablet Take 1 tablet (5 mg total) by mouth every evening. 30 tablet 3   levothyroxine (SYNTHROID) 175 MCG tablet Take 1 tablet (175 mcg total) by mouth daily. 90 tablet 3   lisinopril (ZESTRIL) 20 MG tablet TAKE 1 TABLET BY MOUTH EVERY DAY 90 tablet 0   metFORMIN (GLUCOPHAGE-XR) 500 MG 24 hr tablet TAKE  2 TABLETS BY MOUTH TWICE A DAY 360 tablet 3   Multiple Vitamin (MULTIVITAMIN) tablet Take 1 tablet by mouth daily.     sertraline (ZOLOFT) 100 MG tablet TAKE 1 TABLET BY MOUTH EVERY DAY 90 tablet 2   timolol (TIMOPTIC) 0.25 % ophthalmic solution 1 drop 2 (two) times daily.     traZODone (DESYREL) 50 MG tablet TAKE 2 TABLETS BY MOUTH AT BEDTIME AS NEEDED FOR SLEEP. 180 tablet 2   No current facility-administered medications for this visit.    MENTAL STATUS AND OBSERVATIONS Appearance:   Casual     Behavior:  Appropriate  Motor:  Normal  Speech/Language:   Clear and Coherent  Affect:  Appropriate and subdued  Mood:  depressed  Thought process:  normal  Thought content:    Self-denigrating thoughts  Sensory/Perceptual disturbances:    WNL  Orientation:  Fully oriented  Attention:  Good  Concentration:  Good  Memory:  WNL  Fund of knowledge:   Fair  Insight:    Fair  Judgment:   Good  Impulse Control:  Good   Initial Risk Assessment: Danger to self: No Self-injurious behavior: No Danger to others: No Physical aggression / violence: No Duty to warn: No Access to firearms a concern: No Gang involvement: No Patient / guardian was educated about steps to take if suicide or homicide risk level increases between visits: yes While future psychiatric events cannot be accurately predicted, the patient does not currently require acute inpatient psychiatric care and does not currently meet [facility] involuntary commitment criteria.   DIAGNOSIS:    ICD-10-CM   1. Major depressive disorder, recurrent episode, moderate (HCC)  F33.1     2. Alcohol use, r/o use disorder in early remission  Z78.9     3. r/o Social Anxiety D/O  R69       INITIAL TREATMENT: Support/validation provided for distressing symptoms and confirmed rapport Ethical orientation and informed consent confirmed re: privacy rights -- including but not limited to HIPAA, EMR and use of e-PHI patient responsibilities  -- scheduling, fair notice of changes, in-person vs. telehealth and regulatory and financial conditions affecting choice expectations for working relationship in psychotherapy needs and consents for working partnerships and exchange of information with other health care providers, especially any medication and other behavioral health providers Initial orientation to cognitive-behavioral and solution-focused therapy approach Psychoeducation and initial recommendations: Interpreted longterm depression, both undertreated and complicated by alcohol use.  Discussed rose of medication, alcohol, and nutrition and encouraged in refining each. Interpreted possible post-cardiac inhibition, loss of libido Brainstormed what constitutes enjoyment  Contextualized gestalt therapy -- not always necessary to get angry, may have other things to say, or to hear Outlook for therapy -- scheduling constraints, availability of crisis service, inclusion of family member(s) as appropriate  Plan: Recommend med review -- check with PCP about blessing to see psychiatrist vs .try to manage together.  Probably worth genetic testing to cut down guesswork matching medications.  Probable that Zoloft has petered out at this dose, which is low, but also possible  Recommend B complex, omega 3 supplementation for overall brain health and resistance to depression Brainstorm ideas for quality time with [name] -- does not have to be sexually intimate unless both interested For awakening enjoyment, break out some old favorite music and see what happens Maintain medication as prescribed and work faithfully with relevant prescriber(s) if any changes are desired or seem indicated Call the clinic on-call service, present to ER, or call 911 if any life-threatening psychiatric crisis Return for time as available, preferably within a month, likes mornings when able, needs ROI for PCP.  Blanchie Serve, PhD  Luan Moore, PhD LP Clinical  Psychologist, [facility] [address], [place], [place] [address] [phone]

## 2022-07-21 ENCOUNTER — Encounter: Payer: Self-pay | Admitting: Family Medicine

## 2022-07-23 ENCOUNTER — Other Ambulatory Visit: Payer: Self-pay | Admitting: Family Medicine

## 2022-08-03 ENCOUNTER — Ambulatory Visit (INDEPENDENT_AMBULATORY_CARE_PROVIDER_SITE_OTHER): Payer: Medicare Other | Admitting: Psychiatry

## 2022-08-03 DIAGNOSIS — F331 Major depressive disorder, recurrent, moderate: Secondary | ICD-10-CM | POA: Diagnosis not present

## 2022-08-03 DIAGNOSIS — R69 Illness, unspecified: Secondary | ICD-10-CM

## 2022-08-03 DIAGNOSIS — Z789 Other specified health status: Secondary | ICD-10-CM

## 2022-08-03 NOTE — Progress Notes (Signed)
Psychotherapy Progress Note [facility] Shaun Moore, PhD LP  Patient ID: Shaun Gomez)    MRN: [id number] Therapy format: Individual psychotherapy Date: 08/03/2022      Start: 11:22a     Stop: 12:10p     Time Spent: 48 min Location: In-person   Session narrative (presenting needs, interim history, self-report of stressors and symptoms, applications of prior therapy, status changes, and interventions made in session) Presents saying he wants to know how to overcome depression.  Outlined biopsychosocial factors -- best health habits, meaningful activity and connections, and self-talk.  Admits wife Shaun Gomez has noted how roughly he talks to himself in his thoughts.  Just back yesterday from an extended family beach trip, where they watched a sermon on feeling unworthy.  He's felt unworthy for decades.  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  [name] took over the business, working long hours on weekends to build up the business, eventually a power struggle among the three of 7 remaining partners.  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  Went to work for a company in [employer] 20 years, initially very good to him, respected his experience and skills, but when the owner turned it over to his daughter and SIL, the culture of the place changed and he felt too alone.  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  Validated having worked hard and had to feel that some things could still be taken away despite best efforts.  Before that, grew up struggling, one of 6 kids.  Masturbation at issue, was shameful, and had some early  exposure to porn (age 1).  No hx of LD, ADHD, or bullying.  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    Reiterated advice to seek better quality time with wife, be willing to open up to her, and get reacquainted with activities of interest now that this time really is his own to call.  Medication unchanged despite advice last time.  Reiterated advice to check with PCP about increasing Zoloft vs. testing to see if he is genetically better off with another agent.  Psychiatry referral still available.  Therapeutic modalities: Cognitive Behavioral Therapy, Solution-Oriented/Positive Psychology, Ego-Supportive, and Faith-sensitive  Mental Status/Observations:  Appearance:   Casual     Behavior:  Appropriate and reluctant  Motor:  Normal  Speech/Language:   quiet  Affect:  Restricted  Mood:  dysthymic  Thought process:  normal  Thought content:    WNL  Sensory/Perceptual disturbances:    WNL  Orientation:  Fully oriented  Attention:  Good    Concentration:  Good  Memory:  WNL  Insight:    Fair  Judgment:   Good  Impulse Control:  Good   Risk Assessment: Danger to Self: No Self-injurious Behavior: No Danger to Others: No Physical Aggression / Violence: No Duty to Warn: No Access to Firearms a concern: No  Assessment of progress:  stabilized  Diagnosis:   ICD-10-CM   1. Major depressive disorder, recurrent episode, moderate (HCC)  F33.1     2. Alcohol use, r/o use disorder in early remission  Z78.9     3. r/o Social Anxiety D/O  R69      Plan:  Meds -- Review with PCP, option to see psychiatry.  Still worth genetic testing to cut down guesswork matching medications.  Probable that Zoloft has petered out at this dose, which is low, but also possible. Nutrition -- Recommend B complex, omega 3 supplementation for  overall brain health and resistance to depression Alcohol -- continue abstinence Enhancing marital relationship -- Brainstorm ideas for quality time with Shaun Gomez, does not have to be sexually intimate unless both interested Pleasant events scheduling -- Break out some old favorite music and see what happens.  Time in wood shop at interest. Other recommendations/advice as may be noted above Continue to utilize previously learned skills ad lib Maintain medication as prescribed and work faithfully with relevant prescriber(s) if any changes are desired or seem indicated Call the clinic on-call service, 988/hotline, 911, or [facility] to [facility] or ER if any life-threatening psychiatric crisis Return in about 2 weeks (around 08/17/2022), or q 2 wks as able; recommend the early Nov spot. Already scheduled visit in this office Visit date not found.  Blanchie Serve, PhD Shaun Moore, PhD LP Clinical Psychologist, [facility] [address] [place], [place] [address] [phone]

## 2022-08-07 ENCOUNTER — Other Ambulatory Visit: Payer: Self-pay | Admitting: Family Medicine

## 2022-08-11 ENCOUNTER — Other Ambulatory Visit: Payer: Self-pay | Admitting: Family Medicine

## 2022-08-12 MED ORDER — LISINOPRIL 20 MG PO TABS: 20.0000 mg | ORAL_TABLET | Freq: Every day | ORAL | 0 refills | Status: DC

## 2022-08-27 ENCOUNTER — Ambulatory Visit (INDEPENDENT_AMBULATORY_CARE_PROVIDER_SITE_OTHER): Payer: Medicare Other | Admitting: Psychiatry

## 2022-08-27 DIAGNOSIS — H903 Sensorineural hearing loss, bilateral: Secondary | ICD-10-CM

## 2022-08-27 DIAGNOSIS — G4763 Sleep related bruxism: Secondary | ICD-10-CM | POA: Diagnosis not present

## 2022-08-27 DIAGNOSIS — Z789 Other specified health status: Secondary | ICD-10-CM | POA: Diagnosis not present

## 2022-08-27 DIAGNOSIS — F331 Major depressive disorder, recurrent, moderate: Secondary | ICD-10-CM | POA: Diagnosis not present

## 2022-08-27 DIAGNOSIS — G4733 Obstructive sleep apnea (adult) (pediatric): Secondary | ICD-10-CM | POA: Diagnosis not present

## 2022-08-27 DIAGNOSIS — Z8673 Personal history of transient ischemic attack (TIA), and cerebral infarction without residual deficits: Secondary | ICD-10-CM | POA: Diagnosis not present

## 2022-08-27 DIAGNOSIS — R69 Illness, unspecified: Secondary | ICD-10-CM | POA: Diagnosis not present

## 2022-08-27 NOTE — Progress Notes (Addendum)
Psychotherapy Progress Note [facility] Luan Moore, PhD LP  Patient ID: NILO KALSCHEUR)    MRN: [id number] Therapy format: Individual psychotherapy Date: 08/27/2022      Start: 10:15a     Stop: 11:00a     Time Spent: 45 min Location: In-person   Session narrative (presenting needs, interim history, self-report of stressors and symptoms, applications of prior therapy, status changes, and interventions made in session) Has increased Zoloft from 100 to 200, on his own, for about 3 weeks now.  Discussed more common practice to titrate by 73m, not double it, and encouraged check strategy with his doctor before making large changes.  The struggle with depression feels worse, more uncomfortable.  Not suicidal.  PHQ score of 12, focused on self-denigration and initial insomnia.  Opens up at this time wanting more evidence of God and God's love.  Assured that is not disloyal of him, but human to wonder.    Incident a week and a half ago, dreaming of being in a car and something evil trying to get in.  Wife says he was taking swings in his sleep.  Discussed off chance of RBD, more that he is taking trazodone (one 556m.  Might have tried melatonin once but no memory of how it worked.  Oriented to melatonin and how to get it naturally through lighting, but still could try it OTC.  Is on CPAP for apnea, but c/o nosebleeds (right nostril only) plus a right hearing deficit.  MRI has revealed a very mild stroke sometime.  Record shows left parietal stroke, and hx bilateral hearing deficit unrelated.  Does wake up sometimes with nosebleeds, has tried Vaseline on Dr. PiSamella Parrecommendation.  Got disgusted with [facility] when they (apparently) failed to put doctor's consultation and same day as audiology exam.  Is working with [facility] for hearing aids.  Sounds to need ENT review, based on experience of intermittently feeling blocked up.  Does use a bite guard for  bruxism.  Denies bedtime worry, but admits depressive thinking.    Admittedly did not grow up with the love he needed.  Support/empathy provided.  Discussed spiritually grounded tactics for better sleep and a more contented relationship with God.  Offered imagery and prayer for bedtime handing over worries and self-esteem concerns to God of his understanding.  Challenged to consider God pursuing him, but quietly, and already in favor of him, just not impressing him or proving it overtly to him.  Therapeutic modalities: Cognitive Behavioral Therapy, Solution-Oriented/Positive Psychology, Ego-Supportive, Psycho-education/Bibliotherapy, and Faith-sensitive  Mental Status/Observations:  Appearance:   Casual and Neat     Behavior:  Appropriate and reserved  Motor:  Normal  Speech/Language:   Clear and Coherent  Affect:  Restricted  Mood:  depressed  Thought process:  normal  Thought content:    WNL  Sensory/Perceptual disturbances:    WNL  Orientation:  Fully oriented  Attention:  Good    Concentration:  Good  Memory:  WNL  Insight:    Fair  Judgment:   Good  Impulse Control:  Good   Risk Assessment: Danger to Self: No Self-injurious Behavior: No Danger to Others: No Physical Aggression / Violence: No Duty to Warn: No Access to Firearms a concern: No  Assessment of progress:  stabilized  Diagnosis:   ICD-10-CM   1. Major depressive disorder, recurrent episode, moderate (HCC)  F33.1     2. Alcohol use, r/o use disorder in early remission  Z78.9  3. r/o Social Anxiety D/O, REM Behavior Disorder  R69     4. Bilateral sensorineural hearing loss  H90.3     5. OSA on CPAP  G47.33     6. History of stroke  Z86.73     7. Bruxism, sleep-related  G47.63      Plan:  Meds -- Review with PCP, do not self-treat on intuition.  Option to see psychiatry.  Still worth genetic testing to cut down guesswork matching medications.  Time yet to see if Zoloft increase will take effect, but  overshadowed by nutritional and alcohol effects, and it could be better and safer to got to 150, not straight to 200. Health concerns -- Sufficiently address hearing aids with audiology.  Recommend ENT for nosebleed.  Maintain bite guard.  Reserve possibility of post-CVA depression effects. Sleep -- Use OTC melatonin or light control for sleep readiness.  No objection to trazodone, but it's not the same.  Maintain CPAP as indicated. Nutrition -- Recommend B complex, omega 3 supplementation for overall brain health and resistance to depression Alcohol -- continue abstinence Enhancing marital relationship -- Brainstorm ideas for quality time with [name], does not have to be sexually intimate unless both interested Pleasant events scheduling -- Break out some old favorite music and see what happens.  Time in wood shop at interest. Spiritual -- Consider meditation handing over troubles to God at night and interpretation that God loves him, quietly, and a challenge of faith is to trust the quiet, not require more proof. Other recommendations/advice as may be noted above Continue to utilize previously learned skills ad lib Maintain medication as prescribed and work faithfully with relevant prescriber(s) if any changes are desired or seem indicated Call the clinic on-call service, 988/hotline, 911, or present to [facility] or ER if any life-threatening psychiatric crisis Return preferably 2 weeks, see what can be found, for recommend sched ahead. Already scheduled visit in this office Visit date not found.  Blanchie Serve, PhD Luan Moore, PhD LP Clinical Psychologist, [facility] [address] [place], [place] [address] [phone]

## 2022-11-02 ENCOUNTER — Ambulatory Visit (INDEPENDENT_AMBULATORY_CARE_PROVIDER_SITE_OTHER): Payer: Medicare Other | Admitting: Psychiatry

## 2022-11-02 DIAGNOSIS — R69 Illness, unspecified: Secondary | ICD-10-CM

## 2022-11-02 DIAGNOSIS — Z789 Other specified health status: Secondary | ICD-10-CM | POA: Diagnosis not present

## 2022-11-02 DIAGNOSIS — H903 Sensorineural hearing loss, bilateral: Secondary | ICD-10-CM | POA: Diagnosis not present

## 2022-11-02 DIAGNOSIS — F331 Major depressive disorder, recurrent, moderate: Secondary | ICD-10-CM | POA: Diagnosis not present

## 2022-11-02 DIAGNOSIS — G4733 Obstructive sleep apnea (adult) (pediatric): Secondary | ICD-10-CM | POA: Diagnosis not present

## 2022-11-02 DIAGNOSIS — Z8673 Personal history of transient ischemic attack (TIA), and cerebral infarction without residual deficits: Secondary | ICD-10-CM | POA: Diagnosis not present

## 2022-11-02 DIAGNOSIS — G4763 Sleep related bruxism: Secondary | ICD-10-CM | POA: Diagnosis not present

## 2022-11-02 DIAGNOSIS — N529 Male erectile dysfunction, unspecified: Secondary | ICD-10-CM | POA: Diagnosis not present

## 2022-11-02 NOTE — Progress Notes (Signed)
Psychotherapy Progress Note [facility] Luan Moore, PhD LP  Patient ID: ARIANNA BETHLEY)    MRN: [id number] Therapy format: Individual psychotherapy Date: 11/02/2022      Start: 3:10p     Stop: 4:00p     Time Spent: 50 min Location: In-person   Session narrative (presenting needs, interim history, self-report of stressors and symptoms, applications of prior therapy, status changes, and interventions made in session) Out of touch several weeks due to failure to schedule.  Has resolved with the new year that he needs to "get up and fight" depression.  Successfully pushed himself in the last 9 weeks by such things as moving granddaughter to [place].  Had an epiphany about "wallowing" one day and took up reorganizing his wood shop.  Affirmed and encouraged.  Continues on 112m Zoloft.    Had a church deacons' meeting, usually very quiet, but he brought up an issue essentially challenging that people give more, plus another issue about getting agendas ahead of time.  Affirmed and encouraged in contributing.  In his 1st of 3 years in the job right  now.  Sincere concern noted for unifying a congregation segregated by age and worship style.    Re. sleep issues -- CPAP is in good shape, he figures.  Interestingly, no nosebleed while away from home, raising the question of allergens.  Has a theory that they've had dust from drywall work going on at home that may have irritated his membranes.  Encouraged to check it out and more thoroughly filter his air.  Gave some thought to genetic testing but has not yet.  Reinforced the value of checking med interactions against his makeup and the value to cut down guesswork and mistaken meds.  Discussed vitamins again, suggested test vitamin D and B12 if he needs better reason to supplement, but they do commonly help with brain health in general.  Omega 3 as well.  On his list for fighting depression now is treating ED, too.  Feels he  hasn't shown JMarcie Balthe man she married.  Alcohol could have had an effect as well, and stress effects, not to mention longstanding self-esteem issue.  Therapeutic modalities: Cognitive Behavioral Therapy, Solution-Oriented/Positive Psychology, ECustomer service manager and Faith-sensitive  Mental Status/Observations:  Appearance:   Casual     Behavior:  Appropriate, more engaged  Motor:  Normal  Speech/Language:   Clear and Coherent  Affect:  Appropriate  Mood:  dysthymic  Thought process:  normal  Thought content:    WNL  Sensory/Perceptual disturbances:    WNL  Orientation:  Fully oriented  Attention:  Good    Concentration:  Good  Memory:  WNL  Insight:    Good  Judgment:   Good  Impulse Control:  Good   Risk Assessment: Danger to Self: No Self-injurious Behavior: No Danger to Others: No Physical Aggression / Violence: No Duty to Warn: No Access to Firearms a concern: No  Assessment of progress:  progressing  Diagnosis:   ICD-10-CM   1. Major depressive disorder, recurrent episode, moderate (HCC)  F33.1     2. Alcohol use, r/o use disorder in early remission  Z78.9     3. r/o Social Anxiety D/O  R69     4. OSA on CPAP  G47.33     5. Bilateral sensorineural hearing loss  H90.3     6. Bruxism, sleep-related  G47.63     7. History of stroke  Z86.73     8. Erectile dysfunction,  unspecified erectile dysfunction type  N52.9      Plan:  Meds -- Review with PCP, do not self-treat on intuition.  Option to see psychiatry.  Still worth genetic testing to cut down guesswork matching medications.  Time yet to see if Zoloft increase will take effect, but overshadowed by nutritional and alcohol effects, and it could be better and safer to got to 150, not straight to 200. Health concerns -- Sufficiently address hearing aids with audiology.  Maintain bite guard.  Reserve possibility of post-CVA depression effects.  May assess ED and treat with physician. Sleep -- Use OTC melatonin or  light control for sleep readiness.  No objection to trazodone, but it's not the same.  Maintain CPAP as indicated.  Assess and treat air quality for nosebleed as needed. Nutrition -- Recommend B complex, vitamin D, and omega 3 supplementation for overall brain health and resistance to depression.  May test first if desired. Alcohol -- continue abstinence Enhancing marital relationship -- Brainstorm ideas for quality time with [name], does not have to be sexually intimate unless both interested Pleasant events scheduling -- Break out some old favorite music and see what happens.  Time in wood shop at interest. Spiritual -- Consider meditation handing over troubles to God at night and interpretation that God loves him, quietly, and a challenge of faith is to trust the quiet, not require more proof. Other recommendations/advice as may be noted above Continue to utilize previously learned skills ad lib Maintain medication as prescribed and work faithfully with relevant prescriber(s) if any changes are desired or seem indicated Call the clinic on-call service, 988/hotline, 911, or present to [facility] or ER if any life-threatening psychiatric crisis Return for as already scheduled. Already scheduled visit in this office 11/23/2022.  Blanchie Serve, PhD Luan Moore, PhD LP Clinical Psychologist, [facility] [address], [place], [place] [address] [phone]

## 2022-11-23 ENCOUNTER — Ambulatory Visit (INDEPENDENT_AMBULATORY_CARE_PROVIDER_SITE_OTHER): Payer: Medicare Other | Admitting: Psychiatry

## 2022-11-23 ENCOUNTER — Other Ambulatory Visit: Payer: Self-pay | Admitting: Family Medicine

## 2022-11-23 DIAGNOSIS — H903 Sensorineural hearing loss, bilateral: Secondary | ICD-10-CM | POA: Diagnosis not present

## 2022-11-23 DIAGNOSIS — G4763 Sleep related bruxism: Secondary | ICD-10-CM

## 2022-11-23 DIAGNOSIS — Z789 Other specified health status: Secondary | ICD-10-CM

## 2022-11-23 DIAGNOSIS — G4733 Obstructive sleep apnea (adult) (pediatric): Secondary | ICD-10-CM | POA: Diagnosis not present

## 2022-11-23 DIAGNOSIS — F1090 Alcohol use, unspecified, uncomplicated: Secondary | ICD-10-CM

## 2022-11-23 DIAGNOSIS — F331 Major depressive disorder, recurrent, moderate: Secondary | ICD-10-CM

## 2022-11-23 DIAGNOSIS — N529 Male erectile dysfunction, unspecified: Secondary | ICD-10-CM | POA: Diagnosis not present

## 2022-11-23 DIAGNOSIS — R69 Illness, unspecified: Secondary | ICD-10-CM

## 2022-11-23 DIAGNOSIS — Z8673 Personal history of transient ischemic attack (TIA), and cerebral infarction without residual deficits: Secondary | ICD-10-CM

## 2022-11-23 NOTE — Progress Notes (Signed)
Psychotherapy Progress Note [facility] Luan Moore, PhD LP  Patient ID: Shaun Gomez)    MRN: [id number] Therapy format: Individual psychotherapy Date: 11/23/2022      Start: 10:05a     Stop: 10:55p     Time Spent: 50 min Location: In-person   Session narrative (presenting needs, interim history, self-report of stressors and symptoms, applications of prior therapy, status changes, and interventions made in session) 3 wks ago reported better energy and initiative, decision to stop wallowing and get things done, while continuing 145m Zoloft and staying off alcohol, and possible issue with particulate pollution from home reno causing nosebleed in combination with CPAP.  Wishes to treat longstanding ED and recover a long-dormant sexual relationship with wife.  Suggestions made to test common vitamin deficiencies.    Today feeling "pretty low".  Immediately frustrated with hearing aids, pulls them out and puts them away, whining with feedback.  Frust with them in general, says he hears better with them out.  Recommend have them serviced.    Admittedly beginning to question his own salvation now, b/c he doesn't seem to feel what others do.  Daily struggle, mostly evenings, with pornographic thoughts.  Only when alone.  Bad sleep last night for being up thinking.  Typically sleeping 11p-9a.  Ointment fixed the nosebleeds.  Has Rx for trazodone 572m1-2 QHS PRN.  Usually taking 1, but ran out, had none last night.  Admittedly started drinking again, several weeks now, 6pm to bedtime, like before.  No w/d sxs when quit before, but he does feel guilty.  Alcohol usually amounts to several drinks (whiskey & water, typically, strong on the water), est 4 in an evening, not enough to feel drunk.  Figures it takes away anxiety, but has not brought that up with PCP yet.  JaMarcie Baloesn't like his drinking.  Encouraged again to notice alcohol as a relaxer first but then as depressant,  driving low thoughts like these.  Has not tested vitamins (D, B12), nor done genetic profile for meds.  Encouraged again to learn better, so we can treat better.  With consent, messaged PCP in the staff message system.  Therapeutic modalities: Cognitive Behavioral Therapy, Solution-Oriented/Positive Psychology, Ego-Supportive, Psycho-education/Bibliotherapy, and Faith-sensitive  Mental Status/Observations:  Appearance:   Casual     Behavior:  Appropriate  Motor:  Normal  Speech/Language:   Clear and Coherent  Affect:  Appropriate  Mood:  depressed  Thought process:  normal  Thought content:    Rumination  Sensory/Perceptual disturbances:    WNL  Orientation:  Fully oriented  Attention:  Good    Concentration:  Fair  Memory:  WNL  Insight:    Fair  Judgment:   Fair  Impulse Control:  Good   Risk Assessment: Danger to Self: No Self-injurious Behavior: No Danger to Others: No Physical Aggression / Violence: No Duty to Warn: No Access to Firearms a concern: No  Assessment of progress:  situational setback(s)  Diagnosis:   ICD-10-CM   1. Major depressive disorder, recurrent episode, moderate (HCC)  F33.1     2. Alcohol use, r/o use disorder  Z78.9     3. r/o Social Anxiety D/O  R69     4. OSA on CPAP  G47.33     5. Bilateral sensorineural hearing loss  H90.3     6. Bruxism, sleep-related  G47.63     7. Erectile dysfunction, unspecified erectile dysfunction type  N52.9     8. History of  stroke  Z86.73      Plan:  Alcohol -- Prefer abstinence, but don't necessarily quit right now.  Do at least measure how much, how fast.  Consider feedback that he changes, goes more isolated, and that alcohol tends to entrench his depression, undermining whatever his antidepressant does for him. Intrusive sexual thoughts -- Reframe as another form of self-soothing or self-medicating, that God knows that, and is not scolding so much as wanting him to have it better than that for ways he  can feel better Antidepressant/biochemical strategy -- Zoloft is probably pooped out, worth reevaluating other choices, but worth getting testing done to make sure we aren't missing common complications, like vitamin D deficiency, B12/folate deficiency, MTHFR mutation, or a genetic mismatch processing antidepressants.  Recommend B complex, vitamin D, and omega 3 supplementation anyway for overall brain health and resistance to depression. Health concerns -- Sufficiently address hearing aids with audiology.  Maintain bite guard.  Reserve possibility of post-CVA depression effects.  May assess ED and treat with physician. Sleep -- OTC melatonin or light control for sleep readiness.  No objection to trazodone, but it's not the same.  Maintain CPAP as indicated.  Assess and treat air quality for nosebleed as needed.  Use visualization PRN of handing off issues to God for the night. Enhancing marital relationship -- Brainstorm ideas for quality time with [name], does not have to be sexually intimate unless both interested Pleasant events scheduling -- Break out some old favorite music and see what happens.  Time in wood shop at interest. Spiritual -- Consider interpretation that God loves him, quietly, and a challenge of faith is to trust the quiet, not require more proof.  Encourage continued service through his church. Other recommendations/advice as may be noted above Continue to utilize previously learned skills ad lib Maintain medication as prescribed and work faithfully with relevant prescriber(s) if any changes are desired or seem indicated Call the clinic on-call service, 988/hotline, 911, or present to [facility] or ER if any life-threatening psychiatric crisis Return in about 3 weeks (around 12/14/2022) for recommend sched ahead. Already scheduled visit in this office 12/14/2022.  Blanchie Serve, PhD Luan Moore, PhD LP Clinical Psychologist, [facility] [address], [place], [place] [address] [phone]

## 2022-11-24 ENCOUNTER — Telehealth: Payer: Self-pay

## 2022-11-24 ENCOUNTER — Other Ambulatory Visit: Payer: Self-pay | Admitting: Family Medicine

## 2022-11-24 MED ORDER — TRAZODONE HCL 50 MG PO TABS: 50.0000 mg | ORAL_TABLET | Freq: Every evening | ORAL | 0 refills | Status: DC | PRN

## 2022-11-24 NOTE — Telephone Encounter (Signed)
Requested Prescriptions  Pending Prescriptions Disp Refills   traZODone (DESYREL) 50 MG tablet [Pharmacy Med Name: TRAZODONE 50 MG TABLET] 60 tablet 0    Sig: TAKE 2 TABLETS BY MOUTH AT BEDTIME AS NEEDED FOR SLEEP.     Psychiatry: Antidepressants - Serotonin Modulator Failed - 11/23/2022  8:36 AM      Failed - Valid encounter within last 6 months    Recent Outpatient Visits           11 months ago Type 2 diabetes mellitus without complication, without long-term current use of insulin ([place])   [place] Pickard, Cammie Mcgee, MD   1 year ago Chronic cough   [place] Dennard Schaumann, Cammie Mcgee, MD   1 year ago Upper respiratory tract infection, unspecified type   [place] Eulogio Bear, NP   2 years ago Hypothyroidism, unspecified type   [place] Susy Frizzle, MD   2 years ago Type 2 diabetes mellitus without complication, without long-term current use of insulin ([place])   [place] Pickard, Cammie Mcgee, MD              Passed - Completed PHQ-2 or PHQ-9 in the last 360 days

## 2022-11-24 NOTE — Telephone Encounter (Signed)
Pt called requesting a refill on Trazodone. Pt has appointment to be seen Thursday 11/26/22. Thank you.

## 2022-11-26 ENCOUNTER — Encounter: Payer: Self-pay | Admitting: Family Medicine

## 2022-11-26 ENCOUNTER — Ambulatory Visit (INDEPENDENT_AMBULATORY_CARE_PROVIDER_SITE_OTHER): Payer: Medicare Other | Admitting: Family Medicine

## 2022-11-26 VITALS — BP 126/72 | HR 69 | Temp 98.5°F | Ht 66.0 in | Wt 199.0 lb

## 2022-11-26 DIAGNOSIS — E119 Type 2 diabetes mellitus without complications: Secondary | ICD-10-CM

## 2022-11-26 DIAGNOSIS — F331 Major depressive disorder, recurrent, moderate: Secondary | ICD-10-CM

## 2022-11-26 DIAGNOSIS — E039 Hypothyroidism, unspecified: Secondary | ICD-10-CM

## 2022-11-26 NOTE — Progress Notes (Signed)
Subjective:    Patient ID: Shaun Gomez, male    DOB: November 27, 1950, 72 y.o.   MRN: [id number] Patient is long overdue for fasting lab work.  Patient psychologist, Dr. Rica Mote, currently reached out to me to discuss possible strategies to help treat the patient's depression.  He had recommended Genesight genetic testing, MTHFR testing (homocysteine, B12, folate) and Vitamin D testing. Patient presents today stating that his depression is worsening.  He reports anhedonia.  He reports lack of joy in his daily activity.  He reports trouble sleeping.  He also reports lack of energy.  He denies any suicidal ideation.  He denies any hallucinations or delusions.  Previously had tried the patient on Effexor however he discontinued the medication after approximately 1 month due to lack of response in his opinion.  Has been on Zoloft for quite some time.  Patient feels that the medication is no longer helping him. Otherwise, he is doing well.  He denies any chest pain shortness of breath or dyspnea on exertion.  He is due to recheck his A1c to monitor his diabetes as well as a TSH to monitor his hypothyroidism.  Blood pressure today is well-controlled. Past Medical History:  Diagnosis Date   CAD (coronary artery disease)    Depression 07/1993   Diabetes mellitus without complication ([place])    Hyperlipidemia 02/1996   Hypertension pre 08/25/1996   OSA on CPAP    Thyroid disease 07/1993   Hypothyroidism   Past Surgical History:  Procedure Laterality Date   APPENDECTOMY  2 YOA   CATARACT EXTRACTION, BILATERAL     CORONARY ARTERY BYPASS GRAFT  02/1996   X 2 EF 30%   MI S/P Caverject Injection  02/1996   RETINAL DETACHMENT SURGERY     Sleep Study  01/03/2006   Severe sleep apnea - 88 events per hour )2 sats down to 50% ?   Stress Cardiolite  03/1996   EF 30%   Current Outpatient Medications on File Prior to Visit  Medication Sig Dispense Refill   aspirin 325 MG tablet Take 325 mg by mouth daily.      dapagliflozin propanediol (FARXIGA) 10 MG TABS tablet Take 1 tablet (10 mg total) by mouth daily before breakfast. 90 tablet 5   latanoprost (XALATAN) 0.005 % ophthalmic solution Place 1 drop into both eyes at bedtime.      levocetirizine (XYZAL ALLERGY 24HR) 5 MG tablet Take 1 tablet (5 mg total) by mouth every evening. 30 tablet 3   levothyroxine (SYNTHROID) 175 MCG tablet Take 1 tablet (175 mcg total) by mouth daily. 90 tablet 3   lisinopril (ZESTRIL) 20 MG tablet Take 1 tablet (20 mg total) by mouth daily. 90 tablet 0   metFORMIN (GLUCOPHAGE-XR) 500 MG 24 hr tablet TAKE 2 TABLETS BY MOUTH TWICE A DAY 360 tablet 3   Multiple Vitamin (MULTIVITAMIN) tablet Take 1 tablet by mouth daily.     sertraline (ZOLOFT) 100 MG tablet TAKE 1 TABLET BY MOUTH EVERY DAY 30 tablet 0   timolol (TIMOPTIC) 0.25 % ophthalmic solution 1 drop 2 (two) times daily.     traZODone (DESYREL) 50 MG tablet Take 1 tablet (50 mg total) by mouth at bedtime as needed for sleep. 60 tablet 0   No current facility-administered medications on file prior to visit.   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  Tobacco Use   Smoking status: Former    Packs/day: 2.00    Years: 15.00    Total pack years: 30.00    Types: Cigarettes   Smokeless tobacco: Never   Tobacco comments:    Quit 1983  Substance and Sexual Activity   Alcohol use: Yes    Comment: case of beer    Drug use: Not on file   Sexual activity: Not on file  Other Topics Concern   Not on file  Social History Narrative   Re-married, lives with wife   2 step-children, 2 granddaughters   Working  at [employer] in [employer], Doctor, general practice   Social Determinants of Health   Financial Resource Strain: Low Risk  (09/17/2020)   Overall Financial Resource Strain (CARDIA)    Difficulty of Paying Living Expenses: Not very hard  Food Insecurity: Not on file  Transportation Needs: Not on file  Physical Activity: Not on file  Stress: Not on file  Social Connections: Not on file  Intimate Partner Violence: Not on file   Family History  Problem Relation Age of Onset   Hypertension Mother    Parkinsonism Mother    Dementia Mother    Diabetes Mother    Stroke Father 77   Depression Sister    Anxiety disorder Sister    Heart disease Brother 74       CAD   Hyperlipidemia Brother    Hypertension Brother    Arthritis Neg Hx    Cancer Neg Hx    Alcohol abuse Neg Hx    Drug abuse Neg Hx    Colon cancer Neg Hx    Prostate cancer Neg Hx    Heart disease Brother 53       CAD      Review of Systems  All other systems reviewed and are negative.      Objective:   Physical Exam Vitals reviewed.  Constitutional:      General: He is not in acute distress.    Appearance: He is well-developed. He is not diaphoretic.  HENT:     Head: Normocephalic and atraumatic.     Right Ear: External ear normal.     Left Ear: External ear normal.     Nose:     Right Sinus: No maxillary sinus tenderness or frontal sinus tenderness.     Left Sinus: No maxillary sinus tenderness or frontal sinus tenderness.     Mouth/Throat:     Pharynx: No oropharyngeal exudate.  Eyes:     General: No scleral icterus.    Conjunctiva/sclera: Conjunctivae normal.     Pupils: Pupils are equal, round, and reactive to light.  Neck:     Thyroid: No thyromegaly.     Vascular: No JVD.  Cardiovascular:     Rate and Rhythm: Normal rate and regular rhythm.     Heart sounds: Normal heart sounds. No murmur heard. Pulmonary:     Effort: Pulmonary effort is normal. No respiratory distress.     Breath  sounds: No stridor. No wheezing, rhonchi or rales.  Abdominal:     General: Bowel sounds are normal. There is no distension.     Palpations: Abdomen is  soft.     Tenderness: There is no abdominal tenderness. There is no guarding or rebound.  Musculoskeletal:     Cervical back: Neck supple.  Lymphadenopathy:     Cervical: No cervical adenopathy.  Neurological:     Mental Status: He is alert and oriented to person, place, and time.     Cranial Nerves: No cranial nerve deficit.     Motor: No abnormal muscle tone.     Coordination: Coordination normal.     Deep Tendon Reflexes: Reflexes are normal and symmetric.          Assessment & Plan:  Type 2 diabetes mellitus without complication, without long-term current use of insulin (HCC) - Plan: CBC with Differential/Platelet, COMPLETE METABOLIC PANEL WITH GFR, Lipid panel, Hemoglobin A1c, TSH, Protein / Creatinine Ratio, Urine  Hypothyroidism, unspecified type - Plan: TSH  MDD (major depressive disorder), recurrent episode, moderate ([place]) I appreciate Dr. Arleta Creek help.  I discussed Genesight genetic testing with the patient and explained the rationale how it could help pinpoint medications that may work better for him.  I explained that the test is FDA approved and accurate with regards to testing the presence of certain genetic traits.  These genes may indicate that certain medications may work better for the patient that others.  However also explained to the patient that this does not prove that certain medications will work better.  Also explained to the patient that the old method was trial and error.  Patient would like to proceed with genetic testing and we performed that today in the office.  I also discussed the association of elevated homocystine levels and low levels of vitamin D, B12, and folate with regards to dementia and depression.  I will be happy to check these levels however the patient is hesitant to perform the blood test  today.  He would like to get the genetic testing first.  He is interested to see if this may help direct therapy and he wants to minimize his testing today.  He does consent to a CMP, lipid panel, A1c, TSH, and urine protein creatinine ratio.  I am very happy with his blood pressure.  Goal A1c is less than 6.5.

## 2022-11-27 LAB — COMPLETE METABOLIC PANEL WITH GFR
AG Ratio: 1.2 (calc) (ref 1.0–2.5)
ALT: 29 U/L (ref 9–46)
AST: 30 U/L (ref 10–35)
Albumin: 3.9 g/dL (ref 3.6–5.1)
Alkaline phosphatase (APISO): 89 U/L (ref 35–144)
BUN/Creatinine Ratio: 15 (calc) (ref 6–22)
BUN: 25 mg/dL (ref 7–25)
CO2: 26 mmol/L (ref 20–32)
Calcium: 9.3 mg/dL (ref 8.6–10.3)
Chloride: 101 mmol/L (ref 98–110)
Creat: 1.68 mg/dL — ABNORMAL HIGH (ref 0.70–1.28)
Globulin: 3.3 g/dL (calc) (ref 1.9–3.7)
Glucose, Bld: 146 mg/dL — ABNORMAL HIGH (ref 65–99)
Potassium: 4.7 mmol/L (ref 3.5–5.3)
Sodium: 136 mmol/L (ref 135–146)
Total Bilirubin: 0.5 mg/dL (ref 0.2–1.2)
Total Protein: 7.2 g/dL (ref 6.1–8.1)
eGFR: 43 mL/min/{1.73_m2} — ABNORMAL LOW (ref >=60)

## 2022-11-27 LAB — LIPID PANEL
Cholesterol: 173 mg/dL (ref <200)
HDL: 60 mg/dL (ref >=40)
LDL Cholesterol (Calc): 89 mg/dL (calc)
Non-HDL Cholesterol (Calc): 113 mg/dL (calc) (ref <130)
Total CHOL/HDL Ratio: 2.9 (calc) (ref <5.0)
Triglycerides: 140 mg/dL (ref <150)

## 2022-11-27 LAB — PROTEIN / CREATININE RATIO, URINE
Creatinine, Urine: 167 mg/dL (ref 20–320)
Protein/Creat Ratio: 102 mg/g creat (ref 25–148)
Protein/Creatinine Ratio: 0.102 mg/mg creat (ref 0.025–0.148)
Total Protein, Urine: 17 mg/dL (ref 5–25)

## 2022-11-27 LAB — CBC WITH DIFFERENTIAL/PLATELET
Absolute Monocytes: 1335 cells/uL — ABNORMAL HIGH (ref 200–950)
Basophils Absolute: 63 cells/uL (ref 0–200)
Basophils Relative: 0.8 %
Eosinophils Absolute: 198 cells/uL (ref 15–500)
Eosinophils Relative: 2.5 %
HCT: 42.7 % (ref 38.5–50.0)
Hemoglobin: 15 g/dL (ref 13.2–17.1)
Lymphs Abs: 2331 cells/uL (ref 850–3900)
MCH: 34.4 pg — ABNORMAL HIGH (ref 27.0–33.0)
MCHC: 35.1 g/dL (ref 32.0–36.0)
MCV: 97.9 fL (ref 80.0–100.0)
MPV: 10.3 fL (ref 7.5–12.5)
Monocytes Relative: 16.9 %
Neutro Abs: 3974 cells/uL (ref 1500–7800)
Neutrophils Relative %: 50.3 %
Platelets: 244 10*3/uL (ref 140–400)
RBC: 4.36 10*6/uL (ref 4.20–5.80)
RDW: 13.2 % (ref 11.0–15.0)
Total Lymphocyte: 29.5 %
WBC: 7.9 10*3/uL (ref 3.8–10.8)

## 2022-11-27 LAB — TSH: TSH: 1.26 mIU/L (ref 0.40–4.50)

## 2022-11-27 LAB — HEMOGLOBIN A1C
Hgb A1c MFr Bld: 6.4 % of total Hgb — ABNORMAL HIGH (ref <5.7)
Mean Plasma Glucose: 137 mg/dL
eAG (mmol/L): 7.6 mmol/L

## 2022-12-13 NOTE — Addendum Note (Signed)
Addended by: Blanchie Serve A on: 12/13/2022 11:00 PM   Modules accepted: Level of Service

## 2022-12-14 ENCOUNTER — Ambulatory Visit (INDEPENDENT_AMBULATORY_CARE_PROVIDER_SITE_OTHER): Payer: Medicare Other | Admitting: Psychiatry

## 2022-12-14 DIAGNOSIS — F331 Major depressive disorder, recurrent, moderate: Secondary | ICD-10-CM | POA: Diagnosis not present

## 2022-12-14 DIAGNOSIS — Z8673 Personal history of transient ischemic attack (TIA), and cerebral infarction without residual deficits: Secondary | ICD-10-CM | POA: Diagnosis not present

## 2022-12-14 DIAGNOSIS — R69 Illness, unspecified: Secondary | ICD-10-CM | POA: Diagnosis not present

## 2022-12-14 DIAGNOSIS — Z789 Other specified health status: Secondary | ICD-10-CM

## 2022-12-14 DIAGNOSIS — G4733 Obstructive sleep apnea (adult) (pediatric): Secondary | ICD-10-CM

## 2022-12-14 DIAGNOSIS — F1090 Alcohol use, unspecified, uncomplicated: Secondary | ICD-10-CM

## 2022-12-14 DIAGNOSIS — H903 Sensorineural hearing loss, bilateral: Secondary | ICD-10-CM | POA: Diagnosis not present

## 2022-12-14 NOTE — Progress Notes (Signed)
Psychotherapy Progress Note [facility] Marliss Czar, PhD LP  Patient ID: Shaun Gomez)    MRN: [id number] Therapy format: Individual psychotherapy Date: 12/14/2022      Start: 10:14a     Stop: 11:04a     Time Spent: 50 min Location: In-person   Session narrative (presenting needs, interim history, self-report of stressors and symptoms, applications of prior therapy, status changes, and interventions made in session) Had testing done, believes he had genetic testing, awaiting results.  Other labs show normal thyroid, controlled diabetes, and some concern for kidneys.  Still notes not a lot of motivation, though he goes to wood shop daily, now finishing up a project for daughter.  Discussed efforts, tried to frame perspective on his efforts under way, rather than to judge them as insufficient, to see them as part of a larger whole, in actual progress.  Encouraged to take a lesson from the creation story and step back each day, say in some way, "It is good" to set a positive tone and help break up the drive to start drinking as his central satisfaction and release.  Zoloft 150mg  remains, awaiting results of genetic testing to guide further.  Dr. Tanya Nones did go over options with him.  Validated that taking a MVI should be getting enough of all the vitamins.  Sleep quality seems OK, still 50mg  trazodone right at bedtime.  Has tried an hour before, with no noticeable difference.  Occasional waking 2:30ish, maybe attributable to social concerns cropping up (feeling a lack of receptivity at church deacons' meeting, perceived pastor backing him off) or possibly to alcohol rebound, and those are the things that come to mind once awake.  Concern for the end times, on one level, but also for people around him letting off church discipline on the assumption that Jesus is coming back soon, including seeing and dismissing a $100K shortfall on budget.  Validated concerns about picking up  responsibility, shared Luther's "apple tree" quote.  Medically, CPAP is still working reliably.    For morale's sake, has felt some enjoyment past in sitting in the woods and observing.  Has thought he might build a stand for the purpose (not hunting, per se). Affirmed and encouraged.   Therapeutic modalities: Cognitive Behavioral Therapy, Solution-Oriented/Positive Psychology, Environmental manager, and Faith-sensitive  Mental Status/Observations:  Appearance:   Casual and Neat     Behavior:  Appropriate  Motor:  Normal  Speech/Language:   Clear and Coherent and quiet  Affect:  Appropriate  Mood:  constricted  Thought process:  normal  Thought content:    WNL  Sensory/Perceptual disturbances:    WNL  Orientation:  Fully oriented  Attention:  Good    Concentration:  Good  Memory:  grossly intact  Insight:    Fair  Judgment:   Good  Impulse Control:  Good   Risk Assessment: Danger to Self: No Self-injurious Behavior: No Danger to Others: No Physical Aggression / Violence: No Duty to Warn: No Access to Firearms a concern: No  Assessment of progress:  progressing  Diagnosis:   ICD-10-CM   1. Major depressive disorder, recurrent episode, moderate (HCC)  F33.1     2. Alcohol use, r/o use disorder  Z78.9     3. r/o Social Anxiety D/O  R69     4. OSA on CPAP  G47.33     5. Bilateral sensorineural hearing loss  H90.3     6. History of stroke  Z86.73  Plan:  Depression -- Behavioral challenges to (1) take a moment to notice accomplishments and say/think "It is good", (2) to find an hour in the woods to just be, and notice, as he has enjoyed before.  Option to go on and build the observation stand as intended. Alcohol -- Prefer abstinence.  At least measure how much, how fast.  Consider feedback that he changes, goes more isolated, and that alcohol tends to entrench his depression, undermining whatever his antidepressant does for him. Antidepressant/biochemical strategy --  Zoloft is probably pooped out, worth reevaluating other choices, but worth getting testing done to make sure we aren't missing common complications, like vitamin D deficiency, B12/folate deficiency, MTHFR mutation, or a genetic mismatch processing antidepressants.  Recommend B complex, vitamin D, and omega 3 supplementation anyway for overall brain health and resistance to depression. Sleep -- OTC melatonin or light control for sleep readiness.  No objection to trazodone, but it's not the same.  Maintain CPAP as indicated.  Assess and treat air quality for nosebleed as needed.  Use visualization PRN of handing off issues to God for the night. Intrusive sexual thoughts -- Reframe as another form of self-soothing or self-medicating, that God knows that, and is not scolding so much as wanting him to know better ways to feel better Health concerns -- Sufficiently address hearing aids with audiology.  Maintain bite guard/CPAP.  Reserve possibility of post-CVA depression effects.  May assess ED and treat with physician. Enhancing marital relationship -- Brainstorm ideas for quality time with [name], does not have to be sexually intimate unless both interested Pleasant events scheduling -- Break out some old favorite music and see what happens.  Time in wood shop at interest. Spiritual -- Consider interpretation that God loves him, quietly, and a challenge of faith is to trust the quiet, not require more proof.  Encourage continued service through his church.   Other recommendations/advice as may be noted above Continue to utilize previously learned skills ad lib Maintain medication as prescribed and work faithfully with relevant prescriber(s) if any changes are desired or seem indicated Call the clinic on-call service, 988/hotline, 911, or present to [facility] or ER if any life-threatening psychiatric crisis Return for as already scheduled. Already scheduled visit in this office 01/01/2023.  Robley Fries,  PhD Marliss Czar, PhD LP Clinical Psychologist, [facility] [address], [address] [place], [place] [address] [phone]

## 2022-12-15 ENCOUNTER — Other Ambulatory Visit: Payer: Self-pay

## 2022-12-15 ENCOUNTER — Other Ambulatory Visit: Payer: Self-pay | Admitting: Family Medicine

## 2022-12-15 ENCOUNTER — Telehealth: Payer: Self-pay

## 2022-12-15 DIAGNOSIS — F331 Major depressive disorder, recurrent, moderate: Secondary | ICD-10-CM

## 2022-12-15 MED ORDER — DESVENLAFAXINE SUCCINATE ER 50 MG PO TB24: 50.0000 mg | ORAL_TABLET | Freq: Every day | ORAL | 1 refills | Status: DC

## 2022-12-15 NOTE — Telephone Encounter (Signed)
12/15/22-LM for pt to call office to advise. Mjp,lpn  Per Dr. Dennard Schaumann: Patient on zoloft.  Genetic profile genesight show this med may not work well for him.  Labs show he may respond better to pristiq 50 mg poqday.  DC zoloft and replace with pristiq 50 mg poqday and recheck in 6 months.

## 2022-12-16 ENCOUNTER — Telehealth: Payer: Self-pay

## 2022-12-16 NOTE — Telephone Encounter (Signed)
Shaun Gomez (Key: [id number])  Your information has been submitted to [employer]. [employer] will review the request and will issue a decision, typically within 1-3 days from your submission. You can check the updated outcome later by reopening this request.  If [employer] has not responded in 1-3 days or if you have any questions about your ePA request, please contact [place] [employer] Part [employer] at [phone]. If you think there may be a problem with your PA request, use our live chat feature at the bottom right.

## 2022-12-28 NOTE — Telephone Encounter (Signed)
Rec' 12/16/22 PA for Desvenlafaxine succinate ER tab ER  been APPROVED  Effective 10/26/22 thru 10/26/23

## 2023-01-01 ENCOUNTER — Ambulatory Visit: Payer: Medicare Other | Admitting: Psychiatry

## 2023-01-11 ENCOUNTER — Encounter: Payer: Self-pay | Admitting: Family Medicine

## 2023-01-12 ENCOUNTER — Other Ambulatory Visit: Payer: Self-pay | Admitting: Family Medicine

## 2023-01-12 ENCOUNTER — Other Ambulatory Visit: Payer: Self-pay

## 2023-01-12 ENCOUNTER — Telehealth: Payer: Self-pay | Admitting: Family Medicine

## 2023-01-12 DIAGNOSIS — F331 Major depressive disorder, recurrent, moderate: Secondary | ICD-10-CM

## 2023-01-12 DIAGNOSIS — R040 Epistaxis: Secondary | ICD-10-CM

## 2023-01-12 MED ORDER — DESVENLAFAXINE SUCCINATE ER 50 MG PO TB24: 50.0000 mg | ORAL_TABLET | Freq: Every day | ORAL | 1 refills | Status: DC

## 2023-01-12 NOTE — Telephone Encounter (Signed)
Prescription Request  01/12/2023  LOV: 11/26/2022  What is the name of the medication or equipment?   desvenlafaxine (PRISTIQ) 50 MG 24 hr tablet  **90 DAY SCRIPT REQUEST**  Have you contacted your pharmacy to request a refill? Yes   Which pharmacy would you like this sent to?  [id number] [address], [place] [address] [place] [address] Phone: [phone] Fax: [phone]    Patient notified that their request is being sent to the clinical staff for review and that they should receive a response within 2 business days.   Please advise pharmacist at [phone].

## 2023-01-14 ENCOUNTER — Ambulatory Visit (INDEPENDENT_AMBULATORY_CARE_PROVIDER_SITE_OTHER): Payer: Medicare Other | Admitting: Family Medicine

## 2023-01-14 ENCOUNTER — Encounter: Payer: Self-pay | Admitting: Family Medicine

## 2023-01-14 VITALS — BP 136/78 | HR 76 | Ht 66.0 in | Wt 200.4 lb

## 2023-01-14 DIAGNOSIS — R040 Epistaxis: Secondary | ICD-10-CM | POA: Diagnosis not present

## 2023-01-14 MED ORDER — DESVENLAFAXINE ER 50 MG PO TB24: 50.0000 mg | ORAL_TABLET | Freq: Every day | ORAL | 3 refills | Status: DC

## 2023-01-14 NOTE — Progress Notes (Signed)
Subjective:    Patient ID: Shaun Gomez, male    DOB: 1951-06-30, 72 y.o.   MRN: [id number] Patient has been dealing with frequent nosebleeds for more than a week.  He states that it bleeds every day out of his right nostril.  On examination today on the anterior septum, there is a small 2 mm papule that appears friable and irritated.  I attempted to cauterize with silver nitrate and if that he bleeds with gentle nucleation.  I aggressively cauterized it twice with silver nitrate sticks.  I was able to achieve hemostasis.  Past Medical History:  Diagnosis Date   CAD (coronary artery disease)    Depression 07/1993   Diabetes mellitus without complication ([place])    Hyperlipidemia 02/1996   Hypertension pre 08/25/1996   OSA on CPAP    Thyroid disease 07/1993   Hypothyroidism   Past Surgical History:  Procedure Laterality Date   APPENDECTOMY  2 YOA   CATARACT EXTRACTION, BILATERAL     CORONARY ARTERY BYPASS GRAFT  02/1996   X 2 EF 30%   MI S/P Caverject Injection  02/1996   RETINAL DETACHMENT SURGERY     Sleep Study  01/03/2006   Severe sleep apnea - 88 events per hour )2 sats down to 50% ?   Stress Cardiolite  03/1996   EF 30%   Current Outpatient Medications on File Prior to Visit  Medication Sig Dispense Refill   aspirin 325 MG tablet Take 325 mg by mouth daily.     dapagliflozin propanediol (FARXIGA) 10 MG TABS tablet Take 1 tablet (10 mg total) by mouth daily before breakfast. 90 tablet 5   desvenlafaxine (PRISTIQ) 50 MG 24 hr tablet Take 1 tablet (50 mg total) by mouth daily. 90 tablet 1   latanoprost (XALATAN) 0.005 % ophthalmic solution Place 1 drop into both eyes at bedtime.      levocetirizine (XYZAL ALLERGY 24HR) 5 MG tablet Take 1 tablet (5 mg total) by mouth every evening. 30 tablet 3   levothyroxine (SYNTHROID) 175 MCG tablet Take 1 tablet (175 mcg total) by mouth daily. 90 tablet 3   lisinopril (ZESTRIL) 20 MG tablet Take 1 tablet (20 mg total) by mouth daily. 90  tablet 0   metFORMIN (GLUCOPHAGE-XR) 500 MG 24 hr tablet TAKE 2 TABLETS BY MOUTH TWICE A DAY 360 tablet 3   Multiple Vitamin (MULTIVITAMIN) tablet Take 1 tablet by mouth daily.     timolol (TIMOPTIC) 0.25 % ophthalmic solution 1 drop 2 (two) times daily.     traZODone (DESYREL) 50 MG tablet Take 1 tablet (50 mg total) by mouth at bedtime as needed for sleep. 60 tablet 0   No current facility-administered medications on file prior to visit.   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  Tobacco Use   Smoking status: Former    Packs/day: 2.00    Years: 15.00    Additional pack years: 0.00    Total pack years: 30.00    Types: Cigarettes   Smokeless tobacco: Never   Tobacco comments:  Quit 1983  Substance and Sexual Activity   Alcohol use: Yes    Comment: case of beer    Drug use: Not on file   Sexual activity: Not on file  Other Topics Concern   Not on file  Social History Narrative   Re-married, lives with wife   2 step-children, 2 granddaughters   Working at [employer] in [employer], Doctor, general practice   Social Determinants of Health   Financial Resource Strain: Low Risk  (09/17/2020)   Overall Financial Resource Strain (CARDIA)    Difficulty of Paying Living Expenses: Not very hard  Food Insecurity: Not on file  Transportation Needs: Not on file  Physical Activity: Not on file  Stress: Not on file  Social Connections: Not on file  Intimate Partner Violence: Not on file   Family History  Problem Relation Age of Onset   Hypertension Mother    Parkinsonism Mother    Dementia Mother    Diabetes  Mother    Stroke Father 79   Depression Sister    Anxiety disorder Sister    Heart disease Brother 3       CAD   Hyperlipidemia Brother    Hypertension Brother    Arthritis Neg Hx    Cancer Neg Hx    Alcohol abuse Neg Hx    Drug abuse Neg Hx    Colon cancer Neg Hx    Prostate cancer Neg Hx    Heart disease Brother 31       CAD      Review of Systems  All other systems reviewed and are negative.      Objective:   Physical Exam Vitals reviewed.  Constitutional:      General: He is not in acute distress.    Appearance: He is well-developed. He is not diaphoretic.  HENT:     Head: Normocephalic and atraumatic.     Right Ear: External ear normal.     Left Ear: External ear normal.     Nose:     Right Nostril: Epistaxis present.     Right Sinus: No maxillary sinus tenderness or frontal sinus tenderness.     Left Sinus: No maxillary sinus tenderness or frontal sinus tenderness.   Neck:     Thyroid: No thyromegaly.     Vascular: No JVD.  Cardiovascular:     Rate and Rhythm: Normal rate and regular rhythm.     Heart sounds: Normal heart sounds. No murmur heard. Pulmonary:     Effort: Pulmonary effort is normal. No respiratory distress.     Breath sounds: No stridor. No wheezing, rhonchi or rales.  Musculoskeletal:     Cervical back: Neck supple.  Lymphadenopathy:     Cervical: No cervical adenopathy.  Neurological:     Mental Status: He is alert.     Motor: No abnormal muscle tone.     Deep Tendon Reflexes: Reflexes are normal and symmetric.          Assessment & Plan:  Epistaxis Hold aspirin.  Apply Afrin cotton swab twice daily for 3 days and place inside the anterior nostril and apply pressure to help achieve vasoconstriction and reduce the chance of recurrent epistaxis.  Hopefully I was able to cauterize it sufficiently to prevent recurrent nosebleeds.  Avoid blowing your nose and scratching or manipulating the affected area which could remove the scab.   If persistent consult ENT

## 2023-01-25 ENCOUNTER — Ambulatory Visit (INDEPENDENT_AMBULATORY_CARE_PROVIDER_SITE_OTHER): Payer: Medicare Other | Admitting: Psychiatry

## 2023-01-25 DIAGNOSIS — G4733 Obstructive sleep apnea (adult) (pediatric): Secondary | ICD-10-CM | POA: Diagnosis not present

## 2023-01-25 DIAGNOSIS — R69 Illness, unspecified: Secondary | ICD-10-CM

## 2023-01-25 DIAGNOSIS — Z8673 Personal history of transient ischemic attack (TIA), and cerebral infarction without residual deficits: Secondary | ICD-10-CM

## 2023-01-25 DIAGNOSIS — F331 Major depressive disorder, recurrent, moderate: Secondary | ICD-10-CM

## 2023-01-25 DIAGNOSIS — Z789 Other specified health status: Secondary | ICD-10-CM | POA: Diagnosis not present

## 2023-01-25 DIAGNOSIS — H903 Sensorineural hearing loss, bilateral: Secondary | ICD-10-CM

## 2023-01-25 NOTE — Progress Notes (Signed)
Psychotherapy Progress Note [facility] Marliss Czar, PhD LP  Patient ID: Shaun Gomez)    MRN: [id number] Therapy format: Individual psychotherapy Date: 01/25/2023      Start: 11:15a     Stop: 12:05p     Time Spent: 50 min Location: In-person   Session narrative (presenting needs, interim history, self-report of stressors and symptoms, applications of prior therapy, status changes, and interventions made in session) 6 wks since seen.  On desvenlafaxine now, in place of sertraline, is feeling more at risk of irritability, actually.  Brought up the possibility that he's been so in the habit of suppressing what bothers him that that is actually a sign of life.  He has had some heart to heart with [name] about being more forthcoming about his feelings.  Got challenged to think of what he might want to do more for fun, but having a hard time.  Identified fishing at [employer], used to like going out with Jimbo, a redneck neighbor there who was relentless about interacting.  Tearful right now in fact, remembering befriending him and losing him.  Support/empathy provided.   Discussed extraversion vs. introversion, how social gatherings are always a lot more "work", and how much Jesusita Oka has been trying to goad himself to be more sociable.  Allowed that he can choose, and hopefully challenge himself in moderation.  Envious of older B Bill, who relates to a variety of guys.  Re. alcohol, has decided he wants to change his evening habit, it's not "working" any more.  Reiterated that he typically works to nightfall, followed by a nightcap.  Worth working at knocking off a little earlier to prove he can, and encouraged again to use the "It is good" strategy, to affirm his own work before even beginning to reach for a sedative like alcohol.  Finding he doesn't need trazodone to sleep the last several nights.  Affirmed and encouraged.  Therapeutic modalities: Cognitive Behavioral Therapy,  Solution-Oriented/Positive Psychology, Environmental manager, and Faith-sensitive  Mental Status/Observations:  Appearance:   Casual     Behavior:  Appropriate  Motor:  Normal  Speech/Language:   Clear and Coherent  Affect:  Appropriate, better energy  Mood:  anxious and dysthymic and lighter  Thought process:  normal  Thought content:    WNL  Sensory/Perceptual disturbances:    WNL  Orientation:  Fully oriented  Attention:  Good    Concentration:  Good  Memory:  WNL  Insight:    Good  Judgment:   Good  Impulse Control:  Good   Risk Assessment: Danger to Self: No Self-injurious Behavior: No Danger to Others: No Physical Aggression / Violence: No Duty to Warn: No Access to Firearms a concern: No  Assessment of progress:  progressing well  Diagnosis:   ICD-10-CM   1. Major depressive disorder, recurrent episode, moderate (HCC)  F33.1     2. Alcohol use, r/o use disorder  Z78.9     3. r/o Social Anxiety D/O  R69     4. OSA on CPAP  G47.33     5. Bilateral sensorineural hearing loss  H90.3     6. History of stroke  Z86.73      Plan:  Depression -- Behavioral challenges to (1) take a moment to notice accomplishments and say/think "It is good", (2) to find an hour in the woods to just be, and notice, as he has enjoyed before.  Option to go on and build the observation stand as intended.  Reframe irritability as a sign of life, not antidepressant failure, and redirect the energy into something useful. Alcohol -- Prefer abstinence.  At least measure how much, how fast.  Consider feedback that he changes, goes more isolated, and that alcohol tends to entrench his depression, undermining whatever his antidepressant does for him. Antidepressant/biochemical strategy -- Endorse updated medication choice, can refer to psychiatry if need.  Worth getting testing done to make sure we aren't missing common complications, like vitamin D deficiency, B12/folate deficiency, MTHFR mutation, or a  genetic mismatch processing antidepressants.  Recommend B complex, vitamin D, and omega 3 supplementation anyway for overall brain health and resistance to depression. Sleep -- OTC melatonin or light control for sleep readiness.  Endorse reducing/weaning trazodone as able.  Maintain CPAP as indicated.  Assess and treat air quality for nosebleed as needed.  Use visualization PRN of handing off issues to God for the night. Intrusive sexual thoughts -- Reframe as another form of self-soothing or self-medicating, that God knows that, and is not scolding so much as wanting him to know better ways to feel better Health concerns -- Sufficiently address hearing aids with audiology.  Maintain bite guard/CPAP.  Reserve possibility of post-CVA depression effects.  May assess ED and treat with physician. Enhancing marital relationship -- Brainstorm ideas for quality time with [name], does not have to be sexually intimate unless both interested Pleasant events scheduling -- Break out some old favorite music and see what happens.  Time in wood shop at interest. Spiritual -- Consider interpretation that God loves him, quietly, and a challenge of faith is to trust the quiet, not require more proof.  Encourage continued service through his church.   Other recommendations/advice as may be noted above Continue to utilize previously learned skills ad lib Maintain medication as prescribed and work faithfully with relevant prescriber(s) if any changes are desired or seem indicated Call the clinic on-call service, 988/hotline, 911, or present to [facility] or ER if any life-threatening psychiatric crisis No follow-ups on file. Already scheduled visit in this office Visit date not found.  Robley Fries, PhD Marliss Czar, PhD LP Clinical Psychologist, [facility] [address] [place], [place] [address] [phone]

## 2023-01-26 DIAGNOSIS — H401132 Primary open-angle glaucoma, bilateral, moderate stage: Secondary | ICD-10-CM | POA: Diagnosis not present

## 2023-02-05 ENCOUNTER — Other Ambulatory Visit: Payer: Self-pay | Admitting: Family Medicine

## 2023-02-05 NOTE — Telephone Encounter (Signed)
Last OV 11/26/22. Requested Prescriptions  Pending Prescriptions Disp Refills   levothyroxine (SYNTHROID) 175 MCG tablet [Pharmacy Med Name: LEVOTHYROXINE 175 MCG TABLET] 90 tablet 3    Sig: TAKE 1 TABLET BY MOUTH EVERY DAY     Endocrinology:  Hypothyroid Agents Failed - 02/05/2023  1:31 PM      Failed - Valid encounter within last 12 months    Recent Outpatient Visits           1 year ago Type 2 diabetes mellitus without complication, without long-term current use of insulin (HCC)   [facility] Pickard, Priscille Heidelberg, MD   1 year ago Chronic cough   [facility] Pickard, Priscille Heidelberg, MD   1 year ago Upper respiratory tract infection, unspecified type   [facility] Medicine Valentino Nose, NP   2 years ago Hypothyroidism, unspecified type   [facility] Medicine Donita Brooks, MD   2 years ago Type 2 diabetes mellitus without complication, without long-term current use of insulin (HCC)   [facility] Medicine Pickard, Priscille Heidelberg, MD              Passed - TSH in normal range and within 360 days    TSH  Date Value Ref Range Status  11/26/2022 1.26 0.40 - 4.50 mIU/L Final

## 2023-02-08 ENCOUNTER — Other Ambulatory Visit: Payer: Self-pay

## 2023-02-08 NOTE — Telephone Encounter (Signed)
Prescription Request  02/08/2023  LOV:01/14/23  What is the name of the medication or equipment? lisinopril (ZESTRIL) 20 MG tablet [[id number]]  Have you contacted your pharmacy to request a refill? Yes   Which pharmacy would you like this sent to?  [id number] [place], [place] - [address] [place] [address] Phone: [phone] Fax: [phone]    Patient notified that their request is being sent to the clinical staff for review and that they should receive a response within 2 business days.   Please advise at [facility] [phone]

## 2023-02-09 MED ORDER — LISINOPRIL 20 MG PO TABS: 20.0000 mg | ORAL_TABLET | Freq: Every day | ORAL | 1 refills | Status: DC

## 2023-02-09 NOTE — Telephone Encounter (Signed)
Requested Prescriptions  Pending Prescriptions Disp Refills   lisinopril (ZESTRIL) 20 MG tablet 90 tablet 1    Sig: Take 1 tablet (20 mg total) by mouth daily.     Cardiovascular:  ACE Inhibitors Failed - 02/08/2023 11:10 AM      Failed - Cr in normal range and within 180 days    Creat  Date Value Ref Range Status  11/26/2022 1.68 (H) 0.70 - 1.28 mg/dL Final   Creatinine, Urine  Date Value Ref Range Status  11/26/2022 167 20 - 320 mg/dL Final         Failed - Valid encounter within last 6 months    Recent Outpatient Visits           1 year ago Type 2 diabetes mellitus without complication, without long-term current use of insulin (HCC)   [facility] Medicine Pickard, Priscille Heidelberg, MD   1 year ago Chronic cough   [facility] Pickard, Priscille Heidelberg, MD   1 year ago Upper respiratory tract infection, unspecified type   [facility] Medicine Valentino Nose, NP   2 years ago Hypothyroidism, unspecified type   [facility] Medicine Donita Brooks, MD   2 years ago Type 2 diabetes mellitus without complication, without long-term current use of insulin (HCC)   [facility] Medicine Pickard, Priscille Heidelberg, MD              Passed - K in normal range and within 180 days    Potassium  Date Value Ref Range Status  11/26/2022 4.7 3.5 - 5.3 mmol/L Final         Passed - Patient is not pregnant      Passed - Last BP in normal range    BP Readings from Last 1 Encounters:  01/14/23 136/78

## 2023-02-09 NOTE — Telephone Encounter (Signed)
Prescription Request  02/09/2023  LOV: 11/26/2022  What is the name of the medication or equipment? traZODone (DESYREL) 50 MG tablet [[id number]]  Have you contacted your pharmacy to request a refill? Yes   Which pharmacy would you like this sent to?  [id number] [place], [place] - [address] [place] [address] Phone: [phone] Fax: [phone]    Patient notified that their request is being sent to the clinical staff for review and that they should receive a response within 2 business days.   Please advise at [facility] [phone]

## 2023-02-17 ENCOUNTER — Encounter: Payer: Self-pay | Admitting: Family Medicine

## 2023-03-01 NOTE — Progress Notes (Incomplete)
Psychotherapy Progress Note [facility] Marliss Czar, PhD LP  Patient ID: Shaun Gomez)    MRN: [id number] Therapy format: Individual psychotherapy Date: 01/25/2023      Start: 11:15a     Stop: ***:***     Time Spent: *** min Location: In-person   Session narrative (presenting needs, interim history, self-report of stressors and symptoms, applications of prior therapy, status changes, and interventions made in session) 6 wks since seen.  On desvenlafaxine now, in place of sertraline, is feeling more at risk of irritability, actually.  Brought up the possibility that he's been so in the habit of suppressing what bothers him that that is actually a sign of life.  He has had some heart to heart with [name] about being more forthcoming about his feelings.  Got challenged to think of what he might want to do more for fun, but having a hard time.  Identified fishing at [employer], used to like going out with Jimbo, a redneck neighbor there who was relentless about interacting.  Tearful right now in fact, remembering befriending him and losing him.  Support/empathy provided.   Discussed extraversion vs. introversion, how social gatherings are always a lot more "work", and how much Jesusita Oka has been trying to goad himself to be more sovciable.  Allowed that he canchoose, and hopefully challenge himself in moderation.  Envious of older B Bill, who relates to a variety of guys.  Re. alcohol, has decided he wants to change his evening habit, it's not "working" any more.  Reiterated that he typically works to nightfall, followed by a nightcap.  Worth working at knocking off a little earlier to prove he can, and encouraged again to use the "It is good" strategy, to affirm his own work before even beginning to reach for a sedative like alcohol.  Finding he doesn't need trazodone to sleep the last several nights.    Therapeutic modalities: {AM:23362::"Cognitive Behavioral  Therapy","Solution-Oriented/Positive Psychology"}  Mental Status/Observations:  Appearance:   {PSY:22683}     Behavior:  {PSY:21022743}  Motor:  {PSY:22302}  Speech/Language:   {PSY:22685}  Affect:  {PSY:22687}  Mood:  {PSY:31886}  Thought process:  {PSY:31888}  Thought content:    {PSY:[phone]}  Sensory/Perceptual disturbances:    {PSY:9107106245}  Orientation:  {Psych Orientation:23301::"Fully oriented"}  Attention:  {Good-Fair-Poor ratings:23770::"Good"}    Concentration:  {Good-Fair-Poor ratings:23770::"Good"}  Memory:  {[phone]}  Insight:    {Good-Fair-Poor ratings:23770::"Good"}  Judgment:   {Good-Fair-Poor ratings:23770::"Good"}  Impulse Control:  {Good-Fair-Poor ratings:23770::"Good"}   Risk Assessment: Danger to Self: {Risk:22599::"No"} Self-injurious Behavior: {Risk:22599::"No"} Danger to Others: {Risk:22599::"No"} Physical Aggression / Violence: {Risk:22599::"No"} Duty to Warn: {AMYesNo:22526::"No"} Access to Firearms a concern: {AMYesNo:22526::"No"}  Assessment of progress:  {Progress:22147::"progressing"}  Diagnosis: No diagnosis found. Plan:  Depression -- Behavioral challenges to (1) take a moment to notice accomplishments and say/think "It is good", (2) to find an hour in the woods to just be, and notice, as he has enjoyed before.  Option to go on and build the observation stand as intended. Alcohol -- Prefer abstinence.  At least measure how much, how fast.  Consider feedback that he changes, goes more isolated, and that alcohol tends to entrench his depression, undermining whatever his antidepressant does for him. Antidepressant/biochemical strategy -- Endorse updated medication choice, can refer to psychiatry if need.  Worth getting testing done to make sure we aren't missing common complications, like vitamin D deficiency, B12/folate deficiency, MTHFR mutation, or a genetic mismatch processing antidepressants.  Recommend B complex, vitamin  D, and omega  3 supplementation anyway for overall brain health and resistance to depression. Sleep -- OTC melatonin or light control for sleep readiness.  No objection to trazodone, but it's not the same.  Maintain CPAP as indicated.  Assess and treat air quality for nosebleed as needed.  Use visualization PRN of handing off issues to God for the night. Intrusive sexual thoughts -- Reframe as another form of self-soothing or self-medicating, that God knows that, and is not scolding so much as wanting him to know better ways to feel better Health concerns -- Sufficiently address hearing aids with audiology.  Maintain bite guard/CPAP.  Reserve possibility of post-CVA depression effects.  May assess ED and treat with physician. Enhancing marital relationship -- Brainstorm ideas for quality time with [name], does not have to be sexually intimate unless both interested Pleasant events scheduling -- Break out some old favorite music and see what happens.  Time in wood shop at interest. Spiritual -- Consider interpretation that God loves him, quietly, and a challenge of faith is to trust the quiet, not require more proof.  Encourage continued service through his church.   Other recommendations/advice as may be noted above Continue to utilize previously learned skills ad lib Maintain medication as prescribed and work faithfully with relevant prescriber(s) if any changes are desired or seem indicated Call the clinic on-call service, 988/hotline, 911, or present to [facility] or ER if any life-threatening psychiatric crisis No follow-ups on file. Already scheduled visit in this office Visit date not found.  Robley Fries, PhD Marliss Czar, PhD LP Clinical Psychologist, [facility] [address], [address] [place], [place] [address] [phone]

## 2023-03-15 ENCOUNTER — Ambulatory Visit (INDEPENDENT_AMBULATORY_CARE_PROVIDER_SITE_OTHER): Payer: Medicare Other | Admitting: Psychiatry

## 2023-03-15 DIAGNOSIS — Z8673 Personal history of transient ischemic attack (TIA), and cerebral infarction without residual deficits: Secondary | ICD-10-CM | POA: Diagnosis not present

## 2023-03-15 DIAGNOSIS — Z789 Other specified health status: Secondary | ICD-10-CM | POA: Diagnosis not present

## 2023-03-15 DIAGNOSIS — R69 Illness, unspecified: Secondary | ICD-10-CM

## 2023-03-15 DIAGNOSIS — F331 Major depressive disorder, recurrent, moderate: Secondary | ICD-10-CM

## 2023-03-15 DIAGNOSIS — G4733 Obstructive sleep apnea (adult) (pediatric): Secondary | ICD-10-CM

## 2023-03-15 NOTE — Progress Notes (Signed)
Psychotherapy Progress Note [facility] Shaun Czar, PhD LP  Patient ID: Shaun Gomez)    MRN: [id number] Therapy format: Individual psychotherapy Date: 03/15/2023      Start: 2:10p     Stop: 3:00p     Time Spent: 50 min Location: In-person   Session narrative (presenting needs, interim history, self-report of stressors and symptoms, applications of prior therapy, status changes, and interventions made in session) "Not too good."  Feels more irritable, anxious on medication (generic Effexor ER, just went up to 100 mg), and sees himself getting snappier with wife, and more withdrawn.  Discussed whether this is actually medication failing him somehow or continued   Been reading Telling Yourself the Truth, by Alwyn Pea, but seems to be trying to just talk himself into feeling better, not reframing negatives to be more humane.  Discussed the real meaning of the idea, encouraged to include honest acknowledgment of how things feel, mixed blessings, and balancing what he can do with what he must accept.  Admittedly still lonely, after the close friends he has seen die, and especially with what effort he's had to go to to make friends.  Discussed options and motivation to approach.  [name], says Shaun Gomez has a Dealer for dropping new errands on him.  Discussed response, challenged beliefs about what he must do, resolved to practice putting up a finger to indicate wait rather than convince himself he has to drop what he's trying to concentrate on.  Therapeutic modalities: Cognitive Behavioral Therapy, Solution-Oriented/Positive Psychology, and Ego-Supportive  Mental Status/Observations:  Appearance:   Casual     Behavior:  Appropriate  Motor:  Normal  Speech/Language:   Clear and Coherent and quiet  Affect:  Constricted  Mood:  dysthymic  Thought process:  normal  Thought content:    WNL  Sensory/Perceptual disturbances:    WNL  Orientation:  Fully oriented   Attention:  Good    Concentration:  Good  Memory:  WNL  Insight:    Fair  Judgment:   Good  Impulse Control:  Good   Risk Assessment: Danger to Self: No Self-injurious Behavior: No Danger to Others: No Physical Aggression / Violence: No Duty to Warn: No Access to Firearms a concern: No  Assessment of progress:  stabilized  Diagnosis:   ICD-10-CM   1. Major depressive disorder, recurrent episode, moderate (HCC)  F33.1     2. Alcohol use, r/o use disorder  Z78.9     3. r/o Social Anxiety D/O  R69     4. OSA on CPAP  G47.33     5. History of stroke  Z86.73      Plan:  Depression -- Behavioral challenges to (1) take a moment to notice accomplishments and say/think "It is good", (2) to find an hour in the woods to just be, and notice, as he has enjoyed before.  Option to go on and build the observation stand as intended.  Reframe irritability as a sign of life, not antidepressant failure, and redirect the energy into something useful. Alcohol -- Prefer abstinence.  At least measure how much, how fast.  Consider feedback that he changes, goes more isolated, and that alcohol tends to entrench his depression, undermining whatever his antidepressant does for him. Antidepressant/biochemical strategy -- Endorse updated medication choice, can refer to psychiatry if need.  Worth getting testing done to make sure we aren't missing common complications, like vitamin D deficiency, B12/folate deficiency, MTHFR mutation, or a genetic mismatch processing  antidepressants.  Recommend B complex, vitamin D, and omega 3 supplementation anyway for overall brain health and resistance to depression. Sleep -- OTC melatonin or light control for sleep readiness.  Endorse reducing/weaning trazodone as able.  Maintain CPAP as indicated.  Assess and treat air quality for nosebleed as needed.  Use visualization PRN of handing off issues to God for the night. Intrusive sexual thoughts -- Reframe as another form of  self-soothing or self-medicating, that God knows that, and is not scolding so much as wanting him to know better ways to feel better Health concerns -- Sufficiently address hearing aids with audiology.  Maintain bite guard/CPAP.  Reserve possibility of post-CVA depression effects.  May assess ED and treat with physician. Enhancing marital relationship -- Brainstorm ideas for quality time with Shaun Gomez, does not have to be sexually intimate unless both interested.  Use friendly hand signal to break up habit of hopping to it and stoking stress/resentment. Pleasant events scheduling -- Break out some old favorite music and see what happens.  Time in wood shop at interest. Spiritual -- Consider interpretation that God loves him, quietly, and a challenge of faith is to trust the quiet, not require more proof.  Encourage continued service through his church.   Other recommendations/advice as may be noted above Continue to utilize previously learned skills ad lib Maintain medication as prescribed and work faithfully with relevant prescriber(s) if any changes are desired or seem indicated Call the clinic on-call service, 988/hotline, 911, or present to [facility] or ER if any life-threatening psychiatric crisis Return for time as already scheduled, recommend sched ahead. Already scheduled visit in this office 04/05/2023.  Shaun Fries, PhD Shaun Czar, PhD LP Clinical Psychologist, [facility] [address] [place], [place] [address] [phone]

## 2023-04-05 ENCOUNTER — Ambulatory Visit: Payer: Medicare Other | Admitting: Psychiatry

## 2023-04-05 ENCOUNTER — Other Ambulatory Visit: Payer: Self-pay | Admitting: Family Medicine

## 2023-04-05 MED ORDER — TRAZODONE HCL 50 MG PO TABS: 50.0000 mg | ORAL_TABLET | Freq: Every evening | ORAL | 0 refills | Status: DC | PRN

## 2023-04-06 ENCOUNTER — Telehealth: Payer: Self-pay

## 2023-04-06 ENCOUNTER — Other Ambulatory Visit: Payer: Self-pay | Admitting: Family Medicine

## 2023-04-06 MED ORDER — DESVENLAFAXINE SUCCINATE ER 100 MG PO TB24: 100.0000 mg | ORAL_TABLET | Freq: Every day | ORAL | 3 refills | Status: DC

## 2023-04-06 NOTE — Telephone Encounter (Signed)
Pt called in wanting to ask if pcp would up the dosage of this med desvenlafaxine (PRISTIQ) 50 MG to 100mg . Pt states that he is now completely out of this med. Pt would like a cb from nurse as well please. Please advise.  Cb#: [phone]

## 2023-04-27 DIAGNOSIS — R079 Chest pain, unspecified: Secondary | ICD-10-CM | POA: Diagnosis not present

## 2023-04-29 ENCOUNTER — Other Ambulatory Visit: Payer: Self-pay | Admitting: Family Medicine

## 2023-04-30 NOTE — Telephone Encounter (Signed)
Requested Prescriptions  Pending Prescriptions Disp Refills   desvenlafaxine (PRISTIQ) 100 MG 24 hr tablet [Pharmacy Med Name: DESVENLAFAXINE SUCCNT ER 100MG ] 90 tablet 2    Sig: TAKE 1 TABLET BY MOUTH EVERY DAY     Psychiatry: Antidepressants - SNRI - desvenlafaxine & venlafaxine Failed - 04/29/2023  1:31 PM      Failed - Cr in normal range and within 360 days    Creat  Date Value Ref Range Status  11/26/2022 1.68 (H) 0.70 - 1.28 mg/dL Final   Creatinine, Urine  Date Value Ref Range Status  11/26/2022 167 20 - 320 mg/dL Final         Failed - Valid encounter within last 6 months    Recent Outpatient Visits           1 year ago Type 2 diabetes mellitus without complication, without long-term current use of insulin (HCC)   [facility] Pickard, Priscille Heidelberg, MD   1 year ago Chronic cough   [facility] Pickard, Priscille Heidelberg, MD   1 year ago Upper respiratory tract infection, unspecified type   [facility] Valentino Nose, NP   2 years ago Hypothyroidism, unspecified type   [facility] Donita Brooks, MD   2 years ago Type 2 diabetes mellitus without complication, without long-term current use of insulin (HCC)   [facility] Pickard, Priscille Heidelberg, MD       Future Appointments             In 1 week Tanya Nones, Priscille Heidelberg, MD [facility]            Failed - Lipid Panel in normal range within the last 12 months    Cholesterol  Date Value Ref Range Status  11/26/2022 173 <200 mg/dL Final   LDL Cholesterol (Calc)  Date Value Ref Range Status  11/26/2022 89 mg/dL (calc) Final    Comment:    Reference range: <100 . Desirable range <100 mg/dL for primary prevention;   <70 mg/dL for patients with CHD or diabetic patients  with > or = 2 CHD risk factors. Marland Kitchen LDL-C is now calculated using the Martin-Hopkins  calculation, which is a validated novel method providing   better accuracy than the Friedewald equation in the  estimation of LDL-C.  Horald Pollen et al. Lenox Ahr. 1610;960(45): 2061-2068  (http://education.[facility].com/faq/FAQ164)    HDL  Date Value Ref Range Status  11/26/2022 60 > OR = 40 mg/dL Final   Triglycerides  Date Value Ref Range Status  11/26/2022 140 <150 mg/dL Final         Passed - Completed PHQ-2 or PHQ-9 in the last 360 days      Passed - Last BP in normal range    BP Readings from Last 1 Encounters:  01/14/23 136/78

## 2023-05-10 ENCOUNTER — Encounter: Payer: Self-pay | Admitting: Family Medicine

## 2023-05-10 ENCOUNTER — Other Ambulatory Visit (HOSPITAL_COMMUNITY): Admission: RE | Admit: 2023-05-10 | Payer: Medicare Other | Source: Ambulatory Visit | Admitting: Family Medicine

## 2023-05-10 ENCOUNTER — Other Ambulatory Visit: Payer: Self-pay | Admitting: Family Medicine

## 2023-05-10 ENCOUNTER — Ambulatory Visit (INDEPENDENT_AMBULATORY_CARE_PROVIDER_SITE_OTHER): Payer: Medicare Other | Admitting: Family Medicine

## 2023-05-10 ENCOUNTER — Ambulatory Visit (HOSPITAL_COMMUNITY)
Admission: RE | Admit: 2023-05-10 | Discharge: 2023-05-10 | Disposition: A | Discharge status: Home / Self Care | Payer: Medicare Other | Source: Ambulatory Visit | Attending: Family Medicine | Admitting: Family Medicine

## 2023-05-10 VITALS — BP 132/76 | HR 64 | Temp 97.6°F | Ht 66.0 in | Wt 196.0 lb

## 2023-05-10 DIAGNOSIS — N529 Male erectile dysfunction, unspecified: Secondary | ICD-10-CM | POA: Diagnosis not present

## 2023-05-10 DIAGNOSIS — R7989 Other specified abnormal findings of blood chemistry: Secondary | ICD-10-CM

## 2023-05-10 DIAGNOSIS — R091 Pleurisy: Secondary | ICD-10-CM

## 2023-05-10 DIAGNOSIS — R0789 Other chest pain: Secondary | ICD-10-CM | POA: Diagnosis not present

## 2023-05-10 DIAGNOSIS — J449 Chronic obstructive pulmonary disease, unspecified: Secondary | ICD-10-CM | POA: Diagnosis not present

## 2023-05-10 DIAGNOSIS — R0781 Pleurodynia: Secondary | ICD-10-CM | POA: Diagnosis not present

## 2023-05-10 DIAGNOSIS — Z951 Presence of aortocoronary bypass graft: Secondary | ICD-10-CM | POA: Diagnosis not present

## 2023-05-10 DIAGNOSIS — I517 Cardiomegaly: Secondary | ICD-10-CM | POA: Diagnosis not present

## 2023-05-10 LAB — CBC WITH DIFFERENTIAL/PLATELET
Abs Immature Granulocytes: 0.01 10*3/uL (ref 0.00–0.07)
Basophils Absolute: 0.1 10*3/uL (ref 0.0–0.1)
Basophils Relative: 1 %
Eosinophils Absolute: 0.1 10*3/uL (ref 0.0–0.5)
Eosinophils Relative: 2 %
HCT: 43 % (ref 39.0–52.0)
Hemoglobin: 14.5 g/dL (ref 13.0–17.0)
Immature Granulocytes: 0 %
Lymphocytes Relative: 32 %
Lymphs Abs: 2.6 10*3/uL (ref 0.7–4.0)
MCH: 35.5 pg — ABNORMAL HIGH (ref 26.0–34.0)
MCHC: 33.7 g/dL (ref 30.0–36.0)
MCV: 105.4 fL — ABNORMAL HIGH (ref 80.0–100.0)
Monocytes Absolute: 1.2 10*3/uL — ABNORMAL HIGH (ref 0.1–1.0)
Monocytes Relative: 15 %
Neutro Abs: 4.2 10*3/uL (ref 1.7–7.7)
Neutrophils Relative %: 50 %
Platelets: 280 10*3/uL (ref 150–400)
RBC: 4.08 MIL/uL — ABNORMAL LOW (ref 4.22–5.81)
RDW: 11.9 % (ref 11.5–15.5)
WBC: 8.2 10*3/uL (ref 4.0–10.5)
nRBC: 0 % (ref 0.0–0.2)

## 2023-05-10 LAB — COMPREHENSIVE METABOLIC PANEL
ALT: 73 U/L — ABNORMAL HIGH (ref 0–44)
AST: 54 U/L — ABNORMAL HIGH (ref 15–41)
Albumin: 3.6 g/dL (ref 3.5–5.0)
Alkaline Phosphatase: 104 U/L (ref 38–126)
Anion gap: 6 (ref 5–15)
BUN: 20 mg/dL (ref 8–23)
CO2: 26 mmol/L (ref 22–32)
Calcium: 8.9 mg/dL (ref 8.9–10.3)
Chloride: 101 mmol/L (ref 98–111)
Creatinine, Ser: 1.28 mg/dL — ABNORMAL HIGH (ref 0.61–1.24)
GFR, Estimated: 59 mL/min — ABNORMAL LOW (ref >60)
Glucose, Bld: 128 mg/dL — ABNORMAL HIGH (ref 70–99)
Potassium: 4.8 mmol/L (ref 3.5–5.1)
Sodium: 133 mmol/L — ABNORMAL LOW (ref 135–145)
Total Bilirubin: 0.7 mg/dL (ref 0.3–1.2)
Total Protein: 7.7 g/dL (ref 6.5–8.1)

## 2023-05-10 LAB — D-DIMER, QUANTITATIVE: D-Dimer, Quant: 0.99 ug/mL-FEU — ABNORMAL HIGH (ref 0.00–0.50)

## 2023-05-10 MED ORDER — DIAZEPAM 5 MG PO TABS: 5.0000 mg | ORAL_TABLET | Freq: Two times a day (BID) | ORAL | 0 refills | Status: AC | PRN

## 2023-05-10 NOTE — Progress Notes (Signed)
Subjective:    Patient ID: Shaun Gomez, male    DOB: October 01, 1951, 72 y.o.   MRN: [id number] 11/26/22 Patient is long overdue for fasting lab work.  Patient psychologist, Dr. Farrel Demark, currently reached out to me to discuss possible strategies to help treat the patient's depression.  He had recommended Genesight genetic testing, MTHFR testing (homocysteine, B12, folate) and Vitamin D testing. Patient presents today stating that his depression is worsening.  He reports anhedonia.  He reports lack of joy in his daily activity.  He reports trouble sleeping.  He also reports lack of energy.  He denies any suicidal ideation.  He denies any hallucinations or delusions.  Previously had tried the patient on Effexor however he discontinued the medication after approximately 1 month due to lack of response in his opinion.  Has been on Zoloft for quite some time.  Patient feels that the medication is no longer helping him. Otherwise, he is doing well.  He denies any chest pain shortness of breath or dyspnea on exertion.  He is due to recheck his A1c to monitor his diabetes as well as a TSH to monitor his hypothyroidism.  Blood pressure today is well-controlled.  05/10/23 After obtaining pharmacogenetic testing, I started Pristiq and uptitrated to 100 mg poqday.  2 weeks ago, the patient developed left-sided chest pain.  The pain was intense.  This was on June 30.  The pain was located lateral to his left nipple and towards his left axilla.  He states that it hurts to take a deep breath.  It hurts if there are sudden motions in the chest wall.  It hurts with palpation on the left chest wall.  He went to a local urgent care who did an EKG and recommended going to the hospital for chest pain.  He refused.  He has been taking ibuprofen and Tylenol for the last 2 weeks the pain is slightly better.  Today on exam he states the pain is bearable.  The pain is located again over the left nipple and in the left axilla.  Coughing  and deep inspiration makes the pain worse.  Movement makes the pain worse.  He denies any falls or injuries however 3 months ago he fell off a ladder and "bruised his left chest".  He denies any hemoptysis.  There is no angina.  He denies any shortness of breath.  He thinks that the chest pain could be due to elevations in his blood triggered by the Pristiq.  Therefore he weaned down to 50 mg of Pristiq and wants to try something different.  He wants to see a urologist for erectile dysfunction.  Specifically he does not want to "just take medicine". Past Medical History:  Diagnosis Date   CAD (coronary artery disease)    Depression 07/1993   Diabetes mellitus without complication (HCC)    Hyperlipidemia 02/1996   Hypertension pre 08/25/1996   OSA on CPAP    Thyroid disease 07/1993   Hypothyroidism   Past Surgical History:  Procedure Laterality Date   APPENDECTOMY  3 YOA   CATARACT EXTRACTION, BILATERAL     CORONARY ARTERY BYPASS GRAFT  02/1996   X 2 EF 30%   MI S/P Caverject Injection  02/1996   RETINAL DETACHMENT SURGERY     Sleep Study  01/03/2006   Severe sleep apnea - 88 events per hour )2 sats down to 50% ?   Stress Cardiolite  03/1996   EF 30%   Current Outpatient  Medications on File Prior to Visit  Medication Sig Dispense Refill   aspirin 325 MG tablet Take 325 mg by mouth daily.     dapagliflozin propanediol (FARXIGA) 10 MG TABS tablet Take 1 tablet (10 mg total) by mouth daily before breakfast. 90 tablet 5   desvenlafaxine (PRISTIQ) 100 MG 24 hr tablet Take 1 tablet (100 mg total) by mouth daily. 30 tablet 3   latanoprost (XALATAN) 0.005 % ophthalmic solution Place 1 drop into both eyes at bedtime.      levocetirizine (XYZAL ALLERGY 24HR) 5 MG tablet Take 1 tablet (5 mg total) by mouth every evening. 30 tablet 3   levothyroxine (SYNTHROID) 175 MCG tablet TAKE 1 TABLET BY MOUTH EVERY DAY 90 tablet 3   lisinopril (ZESTRIL) 20 MG tablet Take 1 tablet (20 mg total) by mouth  daily. 90 tablet 1   metFORMIN (GLUCOPHAGE-XR) 500 MG 24 hr tablet TAKE 2 TABLETS BY MOUTH TWICE A DAY 360 tablet 3   Multiple Vitamin (MULTIVITAMIN) tablet Take 1 tablet by mouth daily.     timolol (TIMOPTIC) 0.25 % ophthalmic solution 1 drop 2 (two) times daily.     traZODone (DESYREL) 50 MG tablet Take 1 tablet (50 mg total) by mouth at bedtime as needed for sleep. 60 tablet 0   No current facility-administered medications on file prior to visit.   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  Tobacco Use   Smoking status: Former    Current packs/day: 2.00    Average packs/day: 2.0 packs/day for 15.0 years (30.0 ttl pk-yrs)    Types: Cigarettes   Smokeless tobacco: Never   Tobacco comments:    Quit 1983  Substance and Sexual Activity   Alcohol use: Yes    Comment: case of beer    Drug use: Not on file   Sexual activity: Not on file  Other Topics Concern   Not on file  Social History Narrative   Re-married, lives with wife   2 step-children, 2 granddaughters   Working at [employer] in [employer], Materials engineer   Social Determinants of Health   Financial Resource Strain: Low Risk  (09/17/2020)   Overall Financial Resource Strain (CARDIA)    Difficulty of Paying Living Expenses: Not very hard  Food Insecurity: Not on file  Transportation Needs: Not on file  Physical Activity: Not on file  Stress: Not on file  Social Connections: Not on file  Intimate Partner Violence: Not on file   Family History  Problem Relation Age of Onset   Hypertension Mother    Parkinsonism Mother    Dementia Mother     Diabetes Mother    Stroke Father 24   Depression Sister    Anxiety disorder Sister    Heart disease Brother 77       CAD   Hyperlipidemia Brother    Hypertension Brother    Arthritis Neg Hx    Cancer Neg Hx    Alcohol abuse Neg Hx    Drug abuse Neg Hx    Colon cancer Neg Hx  Prostate cancer Neg Hx    Heart disease Brother 55       CAD      Review of Systems  All other systems reviewed and are negative.      Objective:   Physical Exam Vitals reviewed.  Constitutional:      General: He is not in acute distress.    Appearance: He is well-developed. He is not diaphoretic.  HENT:     Head: Normocephalic and atraumatic.     Right Ear: External ear normal.     Left Ear: External ear normal.     Nose:     Right Sinus: No maxillary sinus tenderness or frontal sinus tenderness.     Left Sinus: No maxillary sinus tenderness or frontal sinus tenderness.     Mouth/Throat:     Pharynx: No oropharyngeal exudate.  Eyes:     General: No scleral icterus.    Conjunctiva/sclera: Conjunctivae normal.     Pupils: Pupils are equal, round, and reactive to light.  Neck:     Thyroid: No thyromegaly.     Vascular: No JVD.  Cardiovascular:     Rate and Rhythm: Normal rate and regular rhythm.     Heart sounds: Normal heart sounds. No murmur heard.    No friction rub. No gallop.  Pulmonary:     Effort: Pulmonary effort is normal. No respiratory distress.     Breath sounds: No stridor. No wheezing, rhonchi or rales.  Chest:     Chest wall: Tenderness present.    Abdominal:     General: Bowel sounds are normal. There is no distension.     Palpations: Abdomen is soft.     Tenderness: There is no abdominal tenderness. There is no guarding or rebound.  Musculoskeletal:     Cervical back: Neck supple.  Lymphadenopathy:     Cervical: No cervical adenopathy.  Neurological:     Mental Status: He is alert and oriented to person, place, and time.     Cranial Nerves: No cranial nerve  deficit.     Motor: No abnormal muscle tone.     Coordination: Coordination normal.     Deep Tendon Reflexes: Reflexes are normal and symmetric.          Assessment & Plan:  Chest wall pain - Plan: CBC with Differential/Platelet, COMPLETE METABOLIC PANEL WITH GFR, D-dimer, quantitative, DG Chest 2 View, DG Ribs Unilateral Left  Pleurisy - Plan: CBC with Differential/Platelet, COMPLETE METABOLIC PANEL WITH GFR, D-dimer, quantitative, DG Chest 2 View, DG Ribs Unilateral Left  Erectile dysfunction, unspecified erectile dysfunction type - Plan: Ambulatory referral to Urology Patient has had atypical chest wall pain now for 2 weeks.  I do not feel that this is cardiovascular.  His send I feel this may be more likely a torn intercostal muscle versus a cracked rib.  Obtain a D-dimer to look for any evidence of pulmonary embolism given the fact he is having some pleurisy.  Obtain a chest x-ray and also rib x-rays.  If labs are completely normal, given his risk factors, I would recommend a stress test of his heart but I feel that this is most likely musculoskeletal.  Await the results of this testing before deciding about changing the Pristiq as I do not feel the Pristiq has anything to do with the chest pain.  In accordance with the patient's wishes I will set him up to see urology

## 2023-05-11 ENCOUNTER — Ambulatory Visit
Admission: RE | Admit: 2023-05-11 | Discharge: 2023-05-11 | Disposition: A | Discharge status: Home / Self Care | Payer: Medicare Other | Source: Ambulatory Visit | Attending: Family Medicine | Admitting: Family Medicine

## 2023-05-11 ENCOUNTER — Other Ambulatory Visit: Payer: Medicare Other

## 2023-05-11 DIAGNOSIS — R079 Chest pain, unspecified: Secondary | ICD-10-CM | POA: Diagnosis not present

## 2023-05-11 DIAGNOSIS — I700 Atherosclerosis of aorta: Secondary | ICD-10-CM | POA: Diagnosis not present

## 2023-05-11 DIAGNOSIS — Z951 Presence of aortocoronary bypass graft: Secondary | ICD-10-CM | POA: Diagnosis not present

## 2023-05-11 DIAGNOSIS — R091 Pleurisy: Secondary | ICD-10-CM

## 2023-05-11 DIAGNOSIS — R7989 Other specified abnormal findings of blood chemistry: Secondary | ICD-10-CM

## 2023-05-11 MED ORDER — IOPAMIDOL (ISOVUE-370) INJECTION 76%
75.0000 mL | Freq: Once | INTRAVENOUS | Status: AC | PRN
  Administered 2023-05-11: 75 mL via INTRAVENOUS

## 2023-05-12 DIAGNOSIS — H401112 Primary open-angle glaucoma, right eye, moderate stage: Secondary | ICD-10-CM | POA: Diagnosis not present

## 2023-05-18 ENCOUNTER — Encounter: Payer: Self-pay | Admitting: Family Medicine

## 2023-05-18 ENCOUNTER — Ambulatory Visit (INDEPENDENT_AMBULATORY_CARE_PROVIDER_SITE_OTHER): Payer: Medicare Other | Admitting: Family Medicine

## 2023-05-18 VITALS — BP 126/82 | HR 70 | Temp 97.6°F | Ht 66.0 in | Wt 197.2 lb

## 2023-05-18 DIAGNOSIS — R0789 Other chest pain: Secondary | ICD-10-CM | POA: Diagnosis not present

## 2023-05-18 DIAGNOSIS — F331 Major depressive disorder, recurrent, moderate: Secondary | ICD-10-CM

## 2023-05-18 DIAGNOSIS — E039 Hypothyroidism, unspecified: Secondary | ICD-10-CM

## 2023-05-18 DIAGNOSIS — E119 Type 2 diabetes mellitus without complications: Secondary | ICD-10-CM | POA: Diagnosis not present

## 2023-05-18 MED ORDER — DESVENLAFAXINE SUCCINATE ER 50 MG PO TB24: 50.0000 mg | ORAL_TABLET | Freq: Every day | ORAL | 3 refills | Status: DC

## 2023-05-18 MED ORDER — BUPROPION HCL ER (XL) 150 MG PO TB24: 150.0000 mg | ORAL_TABLET | Freq: Every day | ORAL | 3 refills | Status: DC

## 2023-05-18 NOTE — Progress Notes (Signed)
Subjective:    Patient ID: Shaun Gomez, male    DOB: April 29, 1951, 72 y.o.   MRN: [id number]   05/10/23 After obtaining pharmacogenetic testing, I started Pristiq and uptitrated to 100 mg poqday.  2 weeks ago, the patient developed left-sided chest pain.  The pain was intense.  This was on June 30.  The pain was located lateral to his left nipple and towards his left axilla.  He states that it hurts to take a deep breath.  It hurts if there are sudden motions in the chest wall.  It hurts with palpation on the left chest wall.  He went to a local urgent care who did an EKG and recommended going to the hospital for chest pain.  He refused.  He has been taking ibuprofen and Tylenol for the last 2 weeks the pain is slightly better.  Today on exam he states the pain is bearable.  The pain is located again over the left nipple and in the left axilla.  Coughing and deep inspiration makes the pain worse.  Movement makes the pain worse.  He denies any falls or injuries however 3 months ago he fell off a ladder and "bruised his left chest".  He denies any hemoptysis.  There is no angina.  He denies any shortness of breath.  He thinks that the chest pain could be due to elevations in his blood triggered by the Pristiq.  Therefore he weaned down to 50 mg of Pristiq and wants to try something different.  He wants to see a urologist for erectile dysfunction.  Specifically he does not want to "just take medicine".  05/18/23 Patient had an elevated D-dimer.  As a result we ordered a CT angiogram of the chest.  Thankfully there was no evidence of pulmonary embolism.  The left chest wall pain has improved.  He states that he still has the pain but it is much better.  However the CT angiogram did show evidence of cirrhosis.  Therefore I brought the patient back to review this and discuss possible causes and the neck step of workup.  Patient has no family history of liver disease.  Therefore I do not believe Wilson's  disease is a possibility.  We also discussed hemochromatosis however the patient does not have a bronze discoloration to his skin has no family history of liver disease.  He has no potential exposure to hepatitis C or B.  His liver function tests were mildly elevated but not what I would expect for autoimmune hepatitis.  However the patient admits that he has been drinking for years.  He believes that alcohol is most likely because.  He has been abstinent from alcohol ever since our office visit that day.  He denies any tremors or withdrawal symptoms.  However he continues to battle depression Past Medical History:  Diagnosis Date   CAD (coronary artery disease)    Depression 07/1993   Diabetes mellitus without complication (HCC)    Hyperlipidemia 02/1996   Hypertension pre 08/25/1996   OSA on CPAP    Thyroid disease 07/1993   Hypothyroidism   Past Surgical History:  Procedure Laterality Date   APPENDECTOMY  3 YOA   CATARACT EXTRACTION, BILATERAL     CORONARY ARTERY BYPASS GRAFT  02/1996   X 2 EF 30%   MI S/P Caverject Injection  02/1996   RETINAL DETACHMENT SURGERY     Sleep Study  01/03/2006   Severe sleep apnea - 88 events per hour )  2 sats down to 50% ?   Stress Cardiolite  03/1996   EF 30%   Current Outpatient Medications on File Prior to Visit  Medication Sig Dispense Refill   aspirin 325 MG tablet Take 325 mg by mouth daily.     dapagliflozin propanediol (FARXIGA) 10 MG TABS tablet Take 1 tablet (10 mg total) by mouth daily before breakfast. 90 tablet 5   diazepam (VALIUM) 5 MG tablet Take 1 tablet (5 mg total) by mouth every 12 (twelve) hours as needed for anxiety (take 30 minutes before scan). 10 tablet 0   latanoprost (XALATAN) 0.005 % ophthalmic solution Place 1 drop into both eyes at bedtime.      levocetirizine (XYZAL ALLERGY 24HR) 5 MG tablet Take 1 tablet (5 mg total) by mouth every evening. 30 tablet 3   levothyroxine (SYNTHROID) 175 MCG tablet TAKE 1 TABLET BY MOUTH  EVERY DAY 90 tablet 3   lisinopril (ZESTRIL) 20 MG tablet Take 1 tablet (20 mg total) by mouth daily. 90 tablet 1   metFORMIN (GLUCOPHAGE-XR) 500 MG 24 hr tablet TAKE 2 TABLETS BY MOUTH TWICE A DAY 360 tablet 3   Multiple Vitamin (MULTIVITAMIN) tablet Take 1 tablet by mouth daily.     timolol (TIMOPTIC) 0.25 % ophthalmic solution 1 drop 2 (two) times daily.     traZODone (DESYREL) 50 MG tablet Take 1 tablet (50 mg total) by mouth at bedtime as needed for sleep. 60 tablet 0   No current facility-administered medications on file prior to visit.   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  Tobacco Use   Smoking status: Former    Current packs/day: 2.00    Average packs/day: 2.0 packs/day for 15.0 years (30.0 ttl pk-yrs)    Types: Cigarettes   Smokeless tobacco: Never   Tobacco comments:    Quit 1983  Substance and Sexual Activity   Alcohol use: Yes    Comment: case of beer    Drug use: Not on file   Sexual activity: Not on file  Other Topics Concern   Not on file  Social History Narrative   Re-married, lives with wife   2 step-children, 2 granddaughters   Working at [employer] in [employer], Materials engineer   Social Determinants of Health   Financial Resource Strain: Low Risk  (09/17/2020)   Overall Financial Resource Strain (CARDIA)    Difficulty of Paying Living Expenses: Not very hard  Food Insecurity: Not on file  Transportation Needs: Not on file  Physical Activity: Not on file  Stress: Not on file  Social Connections: Not on file  Intimate Partner Violence: Not on file   Family History   Problem Relation Age of Onset   Hypertension Mother    Parkinsonism Mother    Dementia Mother    Diabetes Mother    Stroke Father 79   Depression Sister    Anxiety disorder Sister    Heart disease Brother 63       CAD   Hyperlipidemia Brother    Hypertension Brother    Arthritis  Neg Hx    Cancer Neg Hx    Alcohol abuse Neg Hx    Drug abuse Neg Hx    Colon cancer Neg Hx    Prostate cancer Neg Hx    Heart disease Brother 33       CAD      Review of Systems  All other systems reviewed and are negative.      Objective:   Physical Exam Vitals reviewed.  Constitutional:      General: He is not in acute distress.    Appearance: He is well-developed. He is not diaphoretic.  HENT:     Head: Normocephalic and atraumatic.     Right Ear: External ear normal.     Left Ear: External ear normal.     Nose:     Right Sinus: No maxillary sinus tenderness or frontal sinus tenderness.     Left Sinus: No maxillary sinus tenderness or frontal sinus tenderness.     Mouth/Throat:     Pharynx: No oropharyngeal exudate.  Eyes:     General: No scleral icterus.    Conjunctiva/sclera: Conjunctivae normal.     Pupils: Pupils are equal, round, and reactive to light.  Neck:     Thyroid: No thyromegaly.     Vascular: No JVD.  Cardiovascular:     Rate and Rhythm: Normal rate and regular rhythm.     Heart sounds: Normal heart sounds. No murmur heard.    No friction rub. No gallop.  Pulmonary:     Effort: Pulmonary effort is normal. No respiratory distress.     Breath sounds: No stridor. No wheezing, rhonchi or rales.  Chest:     Chest wall: Tenderness present.    Abdominal:     General: Bowel sounds are normal. There is no distension.     Palpations: Abdomen is soft.     Tenderness: There is no abdominal tenderness. There is no guarding or rebound.  Musculoskeletal:     Cervical back: Neck supple.  Lymphadenopathy:     Cervical: No cervical adenopathy.  Neurological:     Mental  Status: He is alert and oriented to person, place, and time.     Cranial Nerves: No cranial nerve deficit.     Motor: No abnormal muscle tone.     Coordination: Coordination normal.     Deep Tendon Reflexes: Reflexes are normal and symmetric.          Assessment & Plan:  Chest wall pain  MDD (major depressive disorder), recurrent episode, moderate (HCC)  Hypothyroidism, unspecified type  Type 2 diabetes mellitus without complication, without long-term current use of insulin (HCC) Patient declines additional workup for cirrhosis at the present time he attributes this to alcohol.  He would like to stop drinking alcohol and then repeat liver function test in 1 month to see if the liver function test have returned back to normal.  At that time I would also like to check a TSH to monitor his hypothyroidism.  I will check a hemoglobin A1c to monitor his diabetes.  Chest wall pain is improving.  I believe it is muscular.  He declines referral to cardiology for stress testing.  Regarding his depression, we will continue Pristiq 50 mg daily but add Wellbutrin extended release 150 mg daily to augment the Pristiq and reassess in 4 to 6 weeks

## 2023-06-06 IMAGING — MR MR HEAD WO/W CM
11 of 12 series · 37 of 48 positions shown · IV contrast (multihance)
Comparison: None.

CLINICAL DATA: Hearing loss on the right for 2 months

EXAM:
MRI HEAD WITHOUT AND WITH CONTRAST
TECHNIQUE: Multiplanar, multiecho pulse sequences of the brain and surrounding
structures were obtained without and with intravenous contrast.
CONTRAST:  19mL MULTIHANCE GADOBENATE DIMEGLUMINE 529 MG/ML IV SOLN

[Series 2: T1 · sagittal · 5.0mm · 0.45mm/px · 3 of 21 slices shown (1 of 4)]
[im 1/21]
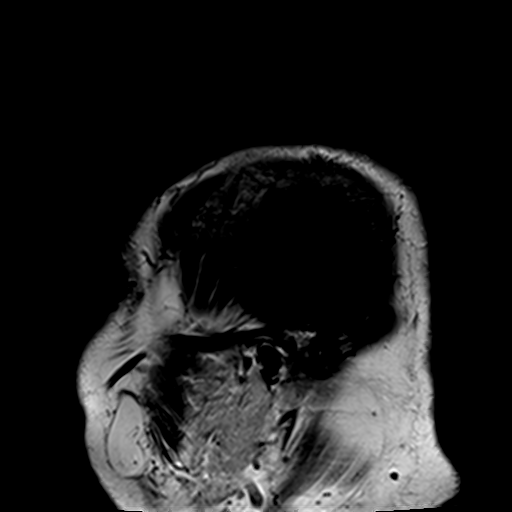
[im 11/21]
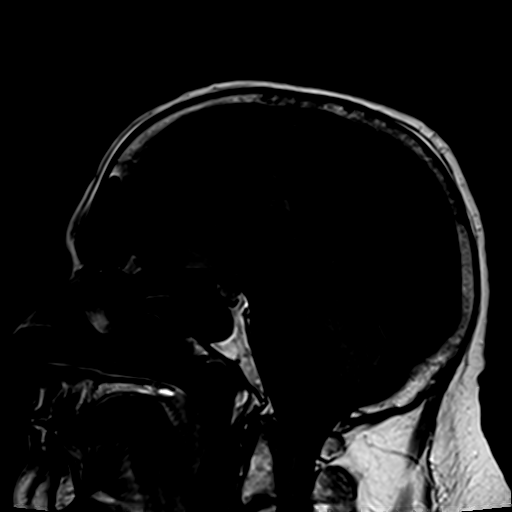
[im 21/21]
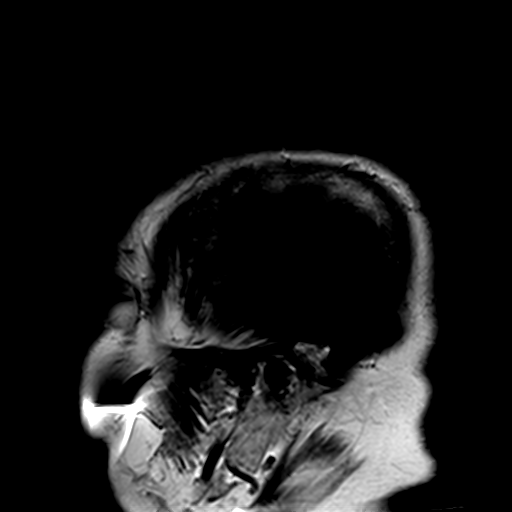

[Series 3: ep2d_diff_3 · axial · 3.0mm · 1.80mm/px · z∈[-72,+74]mm · 8 of 97 slices shown]
[im 1/97]
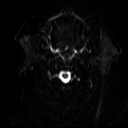
[im 11/97]
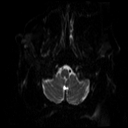
[im 33/97]
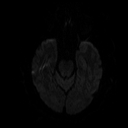
[im 43/97]
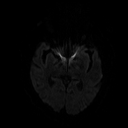
[im 54/97]
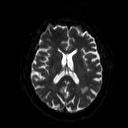
[im 65/97]
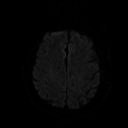
[im 86/97]
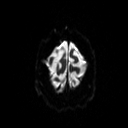
[im 97/97]
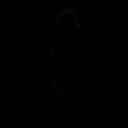

[Series 4: ep2d_diff_3_adc · axial · 3.0mm · 1.80mm/px · z∈[-72,+74]mm · 5 of 49 slices shown]
[im 1/49]
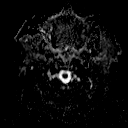
[im 13/49]
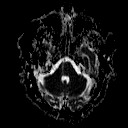
[im 25/49]
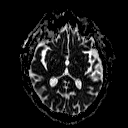
[im 37/49]
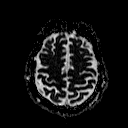
[im 49/49]
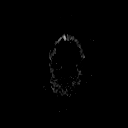

[Series 5: t2_tse_tra_512 · axial · 5.0mm · 0.60mm/px · z∈[-71,+73]mm · 2 of 24 slices shown]
[im 1/24]
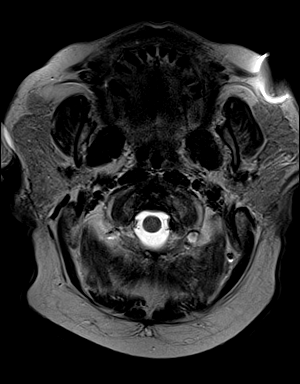
[im 24/24]
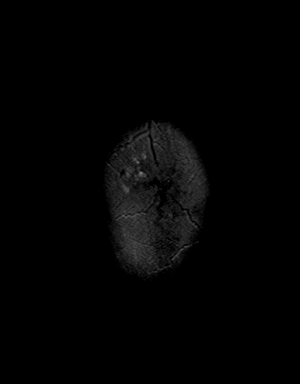

[Series 6: FLAIR · axial · 3.0mm · 0.43mm/px · z∈[-77,+78]mm · 3 of 27 slices shown]
[im 1/27]
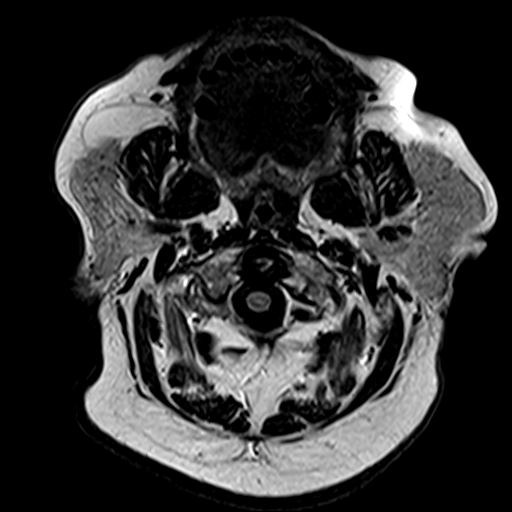
[im 14/27]
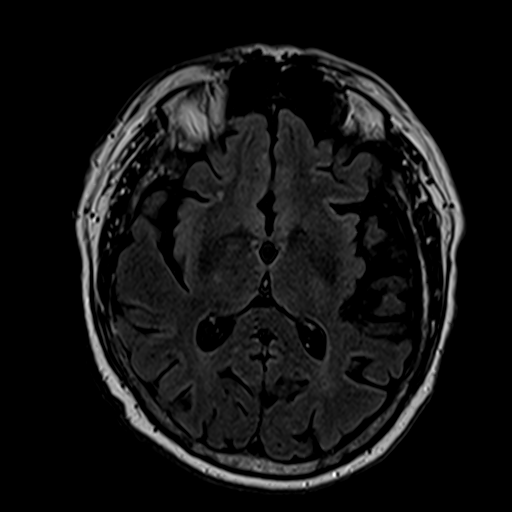
[im 27/27]
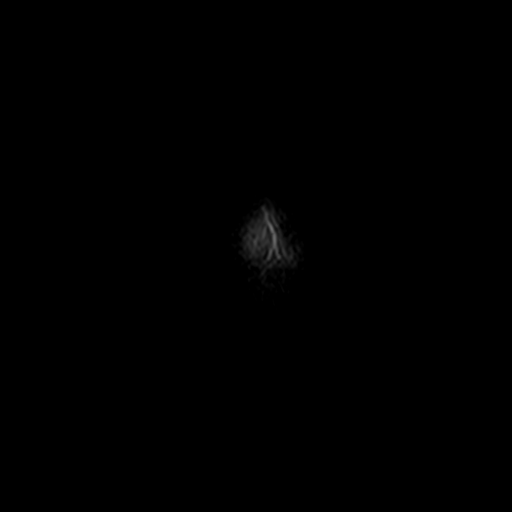

[Series 8: swi_images · axial · 2.0mm · 0.90mm/px · z∈[-78,+80]mm · 8 of 80 slices shown]
[im 1/80]
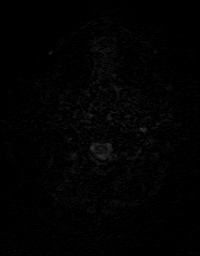
[im 12/80]
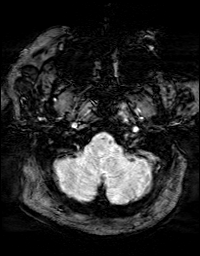
[im 23/80]
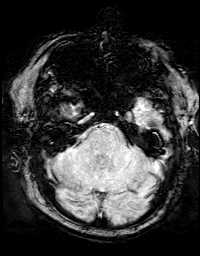
[im 34/80]
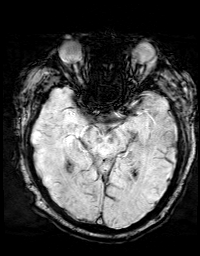
[im 46/80]
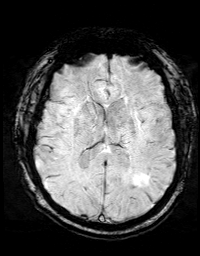
[im 57/80]
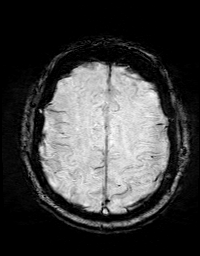
[im 68/80]
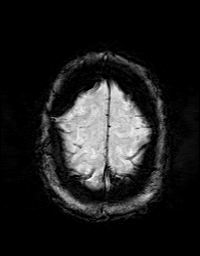
[im 80/80]
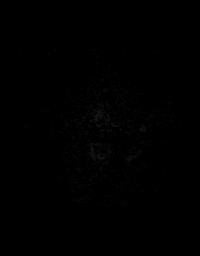

[Series 9: T1 · coronal · 3.0mm · 0.35mm/px · 1 of 11 slices shown (2 of 4)]
[im 1/11]
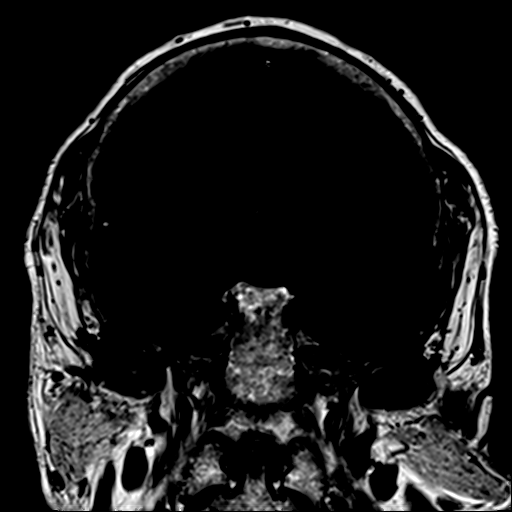

[Series 10: T1 · axial · 3.0mm · 0.35mm/px · 1 of 11 slices shown (3 of 4)]
[im 1/11]
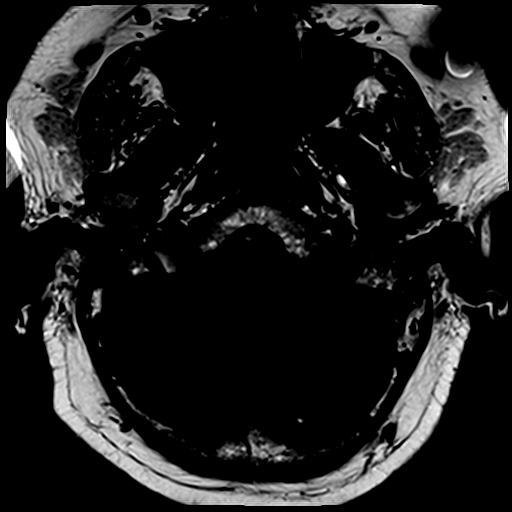

[Series 11: bSSFP · axial · 0.7mm · 0.28mm/px · z∈[-53,-30]mm · 4 of 44 slices shown]
[im 1/44]
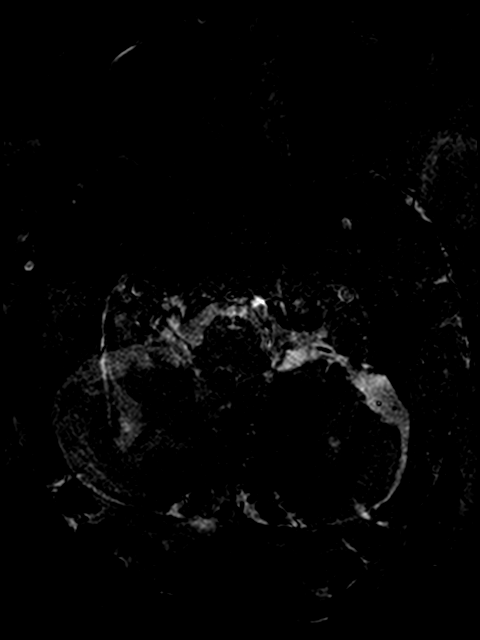
[im 11/44]
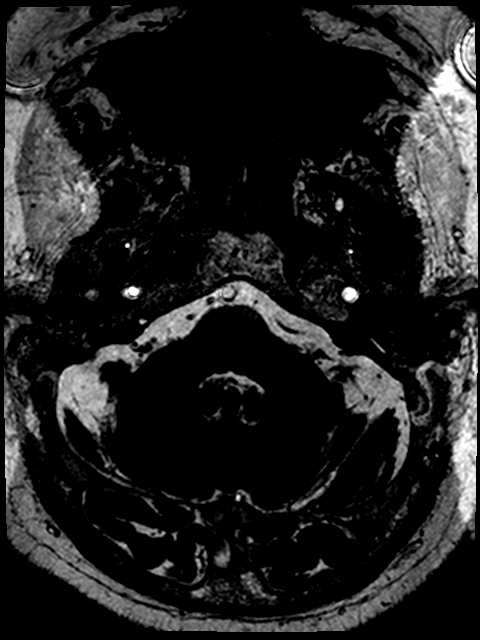
[im 22/44]
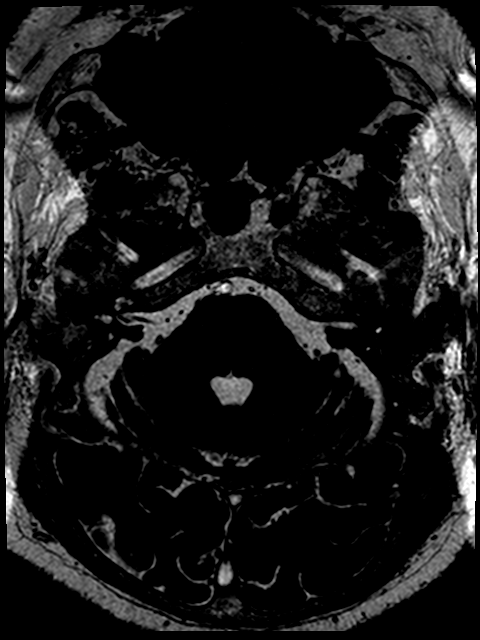
[im 33/44]
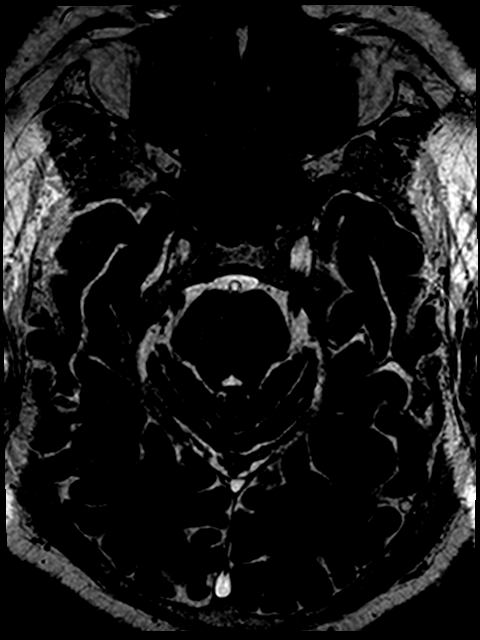

[Series 12: T1 · coronal · 3.0mm · 0.35mm/px · 1 of 11 slices shown (4 of 4)]
[im 1/11]
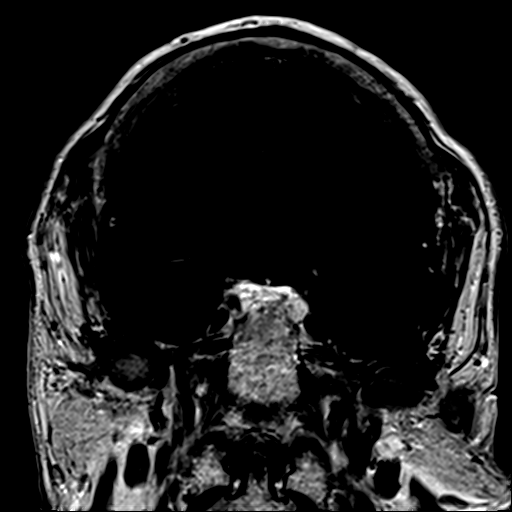

[Series 13: T1 post-contrast · axial · 3.0mm · 0.35mm/px · 1 of 11 slices shown]
[im 1/11]
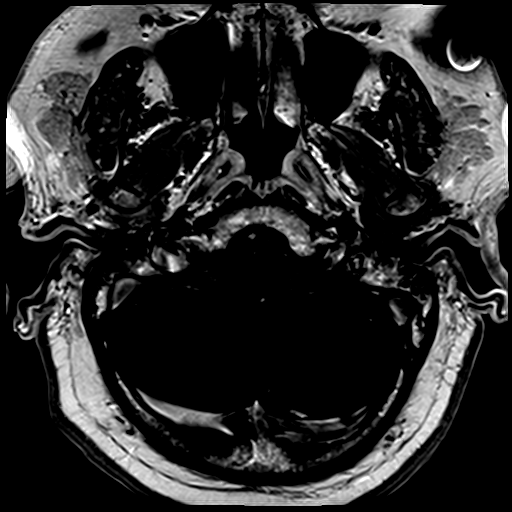

[37 of 48 positions shown; findings below may reference images not displayed]

FINDINGS: Brain: Negative for retrocochlear mass or abnormal enhancement. No
explanation seen in the brainstem or cisterns. There is bilateral
mastoid opacification and possible under pneumatization. No
restricted diffusion at the level of temporal bones.

No recent infarct, hemorrhage, hydrocephalus, or mass. Cortically
based encephalomalacia in the left parietal region, usually post
ischemic.

Vascular: Normal flow voids.

Skull and upper cervical spine: Normal marrow signal.

Sinuses/Orbits: Negative.
IMPRESSION: 1. Negative for retrocochlear lesion.
2. Bilateral mastoid opacification.
3. Small remote left parietal infarct.

## 2023-06-07 ENCOUNTER — Encounter: Payer: Self-pay | Admitting: Family Medicine

## 2023-06-08 ENCOUNTER — Ambulatory Visit: Payer: Medicare Other | Admitting: Psychiatry

## 2023-06-08 DIAGNOSIS — R69 Illness, unspecified: Secondary | ICD-10-CM | POA: Diagnosis not present

## 2023-06-08 DIAGNOSIS — Z8673 Personal history of transient ischemic attack (TIA), and cerebral infarction without residual deficits: Secondary | ICD-10-CM | POA: Diagnosis not present

## 2023-06-08 DIAGNOSIS — F331 Major depressive disorder, recurrent, moderate: Secondary | ICD-10-CM | POA: Diagnosis not present

## 2023-06-08 DIAGNOSIS — G4733 Obstructive sleep apnea (adult) (pediatric): Secondary | ICD-10-CM | POA: Diagnosis not present

## 2023-06-08 DIAGNOSIS — F1011 Alcohol abuse, in remission: Secondary | ICD-10-CM | POA: Diagnosis not present

## 2023-06-08 NOTE — Progress Notes (Incomplete)
Psychotherapy Progress Note [facility] Shaun Czar, PhD LP  Patient ID: Shaun Gomez)    MRN: [id number] Therapy format: Individual psychotherapy Date: 03/15/2023      Start: 2:10p     Stop: 3:00p     Time Spent: 50 min Location: In-person   Session narrative (presenting needs, interim history, self-report of stressors and symptoms, applications of prior therapy, status changes, and interventions made in session) "Not too good."  Feels more irritable, anxious on medication (generic Effexor ER, just went up to 100 mg), and sees himself getting snappier with wife, and more withdrawn.  Discussed whether this is actually medication failing him somehow or continued   Been reading Telling Yourself the Truth, by Alwyn Pea, but seems to be trying to just talk himself into feeling better, not reframing negatives to be more humane.  Discussed the real meaning of the idea, encouraged to include honest acknowledgment of how things feel, mixed blessings, and balancing what he can do with what he must accept.  Admittedly still lonely, after the close friends he has seen die, and especially with what effort he's had to go to to make friends.  Discussed   [name], says Shaun Gomez has a knack for dropping new errands on him.  Discussed response, challenged beliefs about what he must do, resolved to practice putting up a finger to indicate wait rather than convince himself he has to drop what he's trying to concentrate on  Therapeutic modalities: {AM:23362::"Cognitive Behavioral Therapy","Solution-Oriented/Positive Psychology"}  Mental Status/Observations:  Appearance:   {PSY:22683}     Behavior:  {PSY:21022743}  Motor:  {PSY:22302}  Speech/Language:   {PSY:22685}  Affect:  {PSY:22687}  Mood:  {PSY:31886}  Thought process:  {PSY:31888}  Thought content:    {[phone]}  Sensory/Perceptual disturbances:    {[phone]}  Orientation:  {Psych Orientation:23301::"Fully  oriented"}  Attention:  {Good-Fair-Poor ratings:23770::"Good"}    Concentration:  {Good-Fair-Poor ratings:23770::"Good"}  Memory:  {PSY:[phone]}  Insight:    {Good-Fair-Poor ratings:23770::"Good"}  Judgment:   {Good-Fair-Poor ratings:23770::"Good"}  Impulse Control:  {Good-Fair-Poor ratings:23770::"Good"}   Risk Assessment: Danger to Self: {Risk:22599::"No"} Self-injurious Behavior: {Risk:22599::"No"} Danger to Others: {Risk:22599::"No"} Physical Aggression / Violence: {Risk:22599::"No"} Duty to Warn: {AMYesNo:22526::"No"} Access to Firearms a concern: {AMYesNo:22526::"No"}  Assessment of progress:  {Progress:22147::"progressing"}  Diagnosis: No diagnosis found. Plan:  *** Other recommendations/advice as may be noted above Continue to utilize previously learned skills ad lib Maintain medication as prescribed and work faithfully with relevant prescriber(s) if any changes are desired or seem indicated Call the clinic on-call service, 988/hotline, 911, or present to [facility] or ER if any life-threatening psychiatric crisis No follow-ups on file. Already scheduled visit in this office 04/05/2023.  Shaun Fries, PhD Shaun Czar, PhD LP Clinical Psychologist, [facility] [address], [address] [place], [place] [address] [phone]

## 2023-06-08 NOTE — Progress Notes (Signed)
Psychotherapy Progress Note [facility] Marliss Czar, PhD LP  Patient ID: QUINTARIUS DRYSDALE)    MRN: [id number] Therapy format: Individual psychotherapy Date: 06/08/2023      Start: 11:07a     Stop: 11:55a     Time Spent: 48 min Location: In-person   Session narrative (presenting needs, interim history, self-report of stressors and symptoms, applications of prior therapy, status changes, and interventions made in session) Has been away most of 3 months now, sees a need to become more open, less rigid, difficult to specify what he means.  A month ago did quit drinking again, then went through left abdominal pain, says it took 2 weeks to get medical attention (not clear how well he represented his concerns, but he is irritated that he can't get in more quickly), then learned he had strained a muscle.  In th midst of it, made a clear decision to stop drinking.  Not sure why, but lost the desire, he says, but suggestion he was becoming afraid it was making him sick.  He made a decision today to start tapering desvenlafaxine d/t ongoing tremors and anxiety that seem to be worse with dose increase.  Has not yet taken it but thought he would go 100-50-25-off, not sure how fast.  Apparently, PCP also added Wellbutrin when Pristiq "didn't work" and tremor/anxiety continued.  Got "extremely upset" yesterday, needed wife to calm him down, and had strong feeling last night something is just not right and he has to make a change.  Admits he has been having trouble recalling names readily, he is already not the most verbally fluent, plus he has ongoing trouble going to sleep despite trazodone.  Becomes clear in conversation he is preoccupied with being on a range of medications and he suspects they really don't get tested well, just thrown into them market to see what happens.  He is bothered by feeling like a "[place] pig", so validated that he can become more and more anxious just for that  much, effectively being allergic to being medicated, and psychologically in need of some simplification.    On investigation, wife's habit is to stay up to midnight with TV, sometimes fall asleep on the couch while .  He continues to watch.  Easier to fall asleep a bit earlier, if [name] comes up with him and puts the TV on her headphones.  Also trying to take trazodone with a little lead tim to sleep, not right at bedtime.    Otherwise bothered by feeling his strength leave him this year, and how 7 doesn't feel now like it started to earlier, and it scares him.  Examined range of medical findings so far and best hypotheses what to do.  Best I could determine he has not had vitamin testing, though he believes he did, and was told his levels are normal.  Latest vit D showing is a decade ago.  B12 normal almost 5 years ago but testosterone low, glucose high, liver enzymes high.  A1C prediabetic, stable.  Recent notations indicate recommendation to check for pulmonary embolism based on high D-dimer, and CMP last month with low sodium, high glucose, and liver enzymes.  TSH followed for some time, with recent adjustment in Synthroid following normalized finding in February (had been high TSH, sugg hypothyroidism).  Genetic testing found normal MTHFR but did list Wellbutrin as somewhat problematic, Pristiq as nonproblematic, though it could still be that SNRIs in general may be overstimulating, causing tremor, anxiety,  and other feeling off, or just not address an underlying metabolic or hormonal issue.  A month off alcohol, I do not expect any withdrawal sxs, but vitamin balances certainly can be off after a regular habit such as he has had.    Recommended today: Approach PCP about update testing B12, folate, vit D and any other nutritional issues that would make sense for tremor OK in my opinion to withdraw antidepressants -- OK to do rapidly with Wellbutrin, taper with Pristiq -- to test what they ar doing  for him vs. doing to him, but still suspect nutritional or metabolic issue and degraded sleep are powering tremor, and the combination of symptoms, amplified feelings of helplessness around medication and expenses, and general self-consciousness combine to keep him anxious and depressive. Work further with wife on trimming back bedtime and blue light exposure before bed, and establishing a more comforting routine, to help with more restorative sleep. Be sure to bring up with PCP all issues he wants to deal with, including forgot-to-mention SE of metformin  Also notes he routinely does not feel like going to church on Weds, which he sees as a moral failing.  Uncovered how he used to feel it interfered with his evening winddown and drinking, but now it's more the general feeling that it's work/obligation bing a deacon, and less stimulating since pastor eliminated the teaching component.  Validated that it's OK for him to want a different experience (apparently others do, too), not fundamentally selfish, and encouraged either to find what feeds him better, explicitly accept "working" worship as doing his duty, or both.    Discussed outlook for therapy -- in general, does want to coe back, but feels he needs to focus his efforts and his thoughts more, and conserve costs.  Allow to schedule at will.  Therapeutic modalities: Cognitive Behavioral Therapy, Solution-Oriented/Positive Psychology, Environmental manager, and Faith-sensitive  Mental Status/Observations:  Appearance:   Casual     Behavior:  Appropriate and tense  Motor:  Normal  Speech/Language:   Clear and Coherent and difficulty expressing  Affect:  Appropriate and Constricted  Mood:  dysthymic  Thought process:  concrete  Thought content:    WNL and worry  Sensory/Perceptual disturbances:    WNL  Orientation:  Fully oriented  Attention:  Good    Concentration:  Good  Memory:  WNL  Insight:    Fair  Judgment:   Good  Impulse Control:  Good    Risk Assessment: Danger to Self: No Self-injurious Behavior: No Danger to Others: No Physical Aggression / Violence: No Duty to Warn: No Access to Firearms a concern: No  Assessment of progress:  situational setback(s)  Diagnosis:   ICD-10-CM   1. Major depressive disorder, recurrent episode, moderate (HCC)  F33.1     2. Alcohol use disorder, mild, in early remission  F10.11     3. r/o Social Anxiety D/O  R69     4. History of stroke  Z86.73     5. OSA on CPAP  G47.33      Plan:  Depression -- Behavioral challenges to (1) take a moment to notice accomplishments and say/think "It is good", (2) to find an hour in the woods to just be, and notice, as he has enjoyed before.  Option to go on and build the observation stand as intended.  Reframe irritability as a sign of life, not antidepressant failure, and redirect the energy into something useful. Alcohol -- Prefer abstinence.  At least measure how  much, how fast.  Consider feedback that he changes, goes more isolated, and that alcohol tends to entrench his depression, undermining whatever his antidepressant does for him. Antidepressant/biochemical strategy -- Endorse personal choice about medication, consult PCP further or refer to psychiatry at discretion.  Generally, ensure and/or test for adequate B complex, vit D, and antiinflammatory (e.g. omega 3) for overall brain health and resistance to depression, as well as heart health and optimal metabolism. Sleep -- For sleep readiness, trazodone may help, but can try OTC melatonin and/or better control of blue light late.  Should maintain CPAP as indicated as well, making sur to maintain proper settings so as not to have nosebleeds.  For worries, use visualization PRN of handing off issues to God for the night. Intrusive sexual thoughts -- Reframe as another form of self-soothing or self-medicating, that God knows that, and is not scolding so much as wanting him to know better ways to feel  better than indulging lust. Health concerns -- Sufficiently address hearing aids with audiology.  Maintain bite guard/CPAP.  Reserve possibility of post-CVA depression effects.  May assess ED and treat with physician. Enhancing marital relationship -- Brainstorm ideas for quality time with [name], does not have to be sexually intimate unless both interested.  Use friendly hand signal to break up habit of feeling like he ha to hop to it when asked to do something and drive down stress/resentment. Pleasant events scheduling -- Break out some old favorite music and see what happens.  Time in wood shop at interest. Spiritual -- Consider interpretation that God loves him, quietly, and a challenge of his faith is actually to trust the quiet, not work harder or require more proof of his devotion.  Encourage continued service through his church, in any way that makes sense to him.   Other recommendations/advice -- As may be noted above.  Continue to utilize previously learned skills ad lib. Medication compliance -- Maintain medication as prescribed and work faithfully with relevant prescriber(s) if any changes are desired or seem indicated. Crisis service -- Aware of call list and work-in appts.  Call the clinic on-call service, 988/hotline, 911, or present to [facility] or ER if any life-threatening psychiatric crisis. Followup -- Return for time at discretion.  Next scheduled visit with me Visit date not found.  Next scheduled in this office Visit date not found.  Robley Fries, PhD Marliss Czar, PhD LP Clinical Psychologist, [facility] [address] [place], [place] [address] [phone]

## 2023-06-09 ENCOUNTER — Other Ambulatory Visit: Payer: Self-pay | Admitting: Family Medicine

## 2023-06-10 NOTE — Telephone Encounter (Signed)
Rx- 05/18/23 #30 3RF- ok for #90 Requested Prescriptions  Pending Prescriptions Disp Refills   buPROPion (WELLBUTRIN XL) 150 MG 24 hr tablet [Pharmacy Med Name: BUPROPION HCL XL 150 MG TABLET] 90 tablet 0    Sig: TAKE 1 TABLET BY MOUTH EVERY DAY     Psychiatry: Antidepressants - bupropion Failed - 06/09/2023  1:31 PM      Failed - Cr in normal range and within 360 days    Creat  Date Value Ref Range Status  11/26/2022 1.68 (H) 0.70 - 1.28 mg/dL Final   Creatinine, Ser  Date Value Ref Range Status  05/10/2023 1.28 (H) 0.61 - 1.24 mg/dL Final   Creatinine, Urine  Date Value Ref Range Status  11/26/2022 167 20 - 320 mg/dL Final         Failed - AST in normal range and within 360 days    AST  Date Value Ref Range Status  05/10/2023 54 (H) 15 - 41 U/L Final         Failed - ALT in normal range and within 360 days    ALT  Date Value Ref Range Status  05/10/2023 73 (H) 0 - 44 U/L Final         Failed - Valid encounter within last 6 months    Recent Outpatient Visits           1 year ago Type 2 diabetes mellitus without complication, without long-term current use of insulin (HCC)   [facility] Pickard, Priscille Heidelberg, MD   1 year ago Chronic cough   [facility] Tanya Nones, Priscille Heidelberg, MD   1 year ago Upper respiratory tract infection, unspecified type   [facility] Valentino Nose, NP   2 years ago Hypothyroidism, unspecified type   [facility] Donita Brooks, MD   3 years ago Type 2 diabetes mellitus without complication, without long-term current use of insulin (HCC)   [facility] Pickard, Priscille Heidelberg, MD       Future Appointments             Tomorrow Donita Brooks, MD [facility]            Passed - Completed PHQ-2 or PHQ-9 in the last 360 days      Passed - Last BP in normal range    BP Readings from Last 1 Encounters:  05/18/23 126/82

## 2023-06-11 ENCOUNTER — Encounter: Payer: Self-pay | Admitting: Family Medicine

## 2023-06-11 ENCOUNTER — Ambulatory Visit (INDEPENDENT_AMBULATORY_CARE_PROVIDER_SITE_OTHER): Payer: Medicare Other | Admitting: Family Medicine

## 2023-06-11 VITALS — BP 124/72 | HR 71 | Temp 98.5°F | Ht 66.0 in | Wt 197.0 lb

## 2023-06-11 DIAGNOSIS — K7030 Alcoholic cirrhosis of liver without ascites: Secondary | ICD-10-CM

## 2023-06-11 DIAGNOSIS — E039 Hypothyroidism, unspecified: Secondary | ICD-10-CM | POA: Diagnosis not present

## 2023-06-11 DIAGNOSIS — Z8639 Personal history of other endocrine, nutritional and metabolic disease: Secondary | ICD-10-CM

## 2023-06-11 DIAGNOSIS — Z7984 Long term (current) use of oral hypoglycemic drugs: Secondary | ICD-10-CM | POA: Diagnosis not present

## 2023-06-11 DIAGNOSIS — R221 Localized swelling, mass and lump, neck: Secondary | ICD-10-CM

## 2023-06-11 DIAGNOSIS — E119 Type 2 diabetes mellitus without complications: Secondary | ICD-10-CM

## 2023-06-11 MED ORDER — NEOMYCIN-POLYMYXIN-HC 3.5-10000-1 OT SOLN: 3.0000 [drp] | Freq: Four times a day (QID) | OTIC | 0 refills | Status: AC

## 2023-06-11 MED ORDER — SILDENAFIL CITRATE 100 MG PO TABS: 50.0000 mg | ORAL_TABLET | Freq: Every day | ORAL | 11 refills | Status: DC | PRN

## 2023-06-11 NOTE — Progress Notes (Signed)
Subjective:    Patient ID: Shaun Gomez, male    DOB: 08-21-1951, 72 y.o.   MRN: [id number]   05/10/23 After obtaining pharmacogenetic testing, I started Pristiq and uptitrated to 100 mg poqday.  2 weeks ago, the patient developed left-sided chest pain.  The pain was intense.  This was on June 30.  The pain was located lateral to his left nipple and towards his left axilla.  He states that it hurts to take a deep breath.  It hurts if there are sudden motions in the chest wall.  It hurts with palpation on the left chest wall.  He went to a local urgent care who did an EKG and recommended going to the hospital for chest pain.  He refused.  He has been taking ibuprofen and Tylenol for the last 2 weeks the pain is slightly better.  Today on exam he states the pain is bearable.  The pain is located again over the left nipple and in the left axilla.  Coughing and deep inspiration makes the pain worse.  Movement makes the pain worse.  He denies any falls or injuries however 3 months ago he fell off a ladder and "bruised his left chest".  He denies any hemoptysis.  There is no angina.  He denies any shortness of breath.  He thinks that the chest pain could be due to elevations in his blood triggered by the Pristiq.  Therefore he weaned down to 50 mg of Pristiq and wants to try something different.  He wants to see a urologist for erectile dysfunction.  Specifically he does not want to "just take medicine".  05/18/23 Patient had an elevated D-dimer.  As a result we ordered a CT angiogram of the chest.  Thankfully there was no evidence of pulmonary embolism.  The left chest wall pain has improved.  He states that he still has the pain but it is much better.  However the CT angiogram did show evidence of cirrhosis.  Therefore I brought the patient back to review this and discuss possible causes and the neck step of workup.  Patient has no family history of liver disease.  Therefore I do not believe Wilson's  disease is a possibility.  We also discussed hemochromatosis however the patient does not have a bronze discoloration to his skin has no family history of liver disease.  He has no potential exposure to hepatitis C or B.  His liver function tests were mildly elevated but not what I would expect for autoimmune hepatitis.  However the patient admits that he has been drinking for years.  He believes that alcohol is most likely because.  He has been abstinent from alcohol ever since our office visit that day.  He denies any tremors or withdrawal symptoms.  However he continues to battle depression.  At that time, my plan was:'Patient declines additional workup for cirrhosis at the present time he attributes this to alcohol.  He would like to stop drinking alcohol and then repeat liver function test in 1 month to see if the liver function test have returned back to normal.  At that time I would also like to check a TSH to monitor his hypothyroidism.  I will check a hemoglobin A1c to monitor his diabetes.  Chest wall pain is improving.  I believe it is muscular.  He declines referral to cardiology for stress testing.  Regarding his depression, we will continue Pristiq 50 mg daily but add Wellbutrin extended release 150  mg daily to augment the Pristiq and reassess in 4 to 6 weeks  06/11/23 Patient feels that the Desvenlafaxine and Wellbutrin are causing side effects of anxiety, tremors and mood swings with no improvement in depression since 06/07/23. Pt states he saw Counselor on 06/08/23, who advised pt to wean down on Wellbutrin instead of the Pristiq. Pt states he has stopped the Wellbutrin as of 06/08/23.  Patient states that he does not feel the desire will have the energy to fight to try to improve her depression.  He does not feel like doing the things he needs to do.  He knows he needs to change some things "on the inside" however he does not know what to change.  He does not want additional medication.  We  discussed seeing a psychiatrist for a second opinion and he is not interested in that at the present time.  We discussed natural strategies such as exercise, engaging in social activities, volunteering to create a sense of self worth, purpose, and accomplishment.  Patient would like to try to work on these areas.  He is due to recheck his thyroid and his diabetes test.  He is also been alcohol free for more than a month and he would like to recheck his liver function test.  He would like to check his vitamin D level as well. Past Medical History:  Diagnosis Date   CAD (coronary artery disease)    Depression 07/1993   Diabetes mellitus without complication (HCC)    Hyperlipidemia 02/1996   Hypertension pre 08/25/1996   OSA on CPAP    Thyroid disease 07/1993   Hypothyroidism   Past Surgical History:  Procedure Laterality Date   APPENDECTOMY  2 YOA   CATARACT EXTRACTION, BILATERAL     CORONARY ARTERY BYPASS GRAFT  02/1996   X 2 EF 30%   MI S/P Caverject Injection  02/1996   RETINAL DETACHMENT SURGERY     Sleep Study  01/03/2006   Severe sleep apnea - 88 events per hour )2 sats down to 50% ?   Stress Cardiolite  03/1996   EF 30%   Current Outpatient Medications on File Prior to Visit  Medication Sig Dispense Refill   aspirin 325 MG tablet Take 325 mg by mouth daily.     dapagliflozin propanediol (FARXIGA) 10 MG TABS tablet Take 1 tablet (10 mg total) by mouth daily before breakfast. 90 tablet 5   desvenlafaxine (PRISTIQ) 100 MG 24 hr tablet Take 100 mg by mouth daily.     diazepam (VALIUM) 5 MG tablet Take 1 tablet (5 mg total) by mouth every 12 (twelve) hours as needed for anxiety (take 30 minutes before scan). 10 tablet 0   latanoprost (XALATAN) 0.005 % ophthalmic solution Place 1 drop into both eyes at bedtime.      levocetirizine (XYZAL ALLERGY 24HR) 5 MG tablet Take 1 tablet (5 mg total) by mouth every evening. 30 tablet 3   levothyroxine (SYNTHROID) 175 MCG tablet TAKE 1 TABLET BY  MOUTH EVERY DAY 90 tablet 3   lisinopril (ZESTRIL) 20 MG tablet Take 1 tablet (20 mg total) by mouth daily. 90 tablet 1   metFORMIN (GLUCOPHAGE-XR) 500 MG 24 hr tablet TAKE 2 TABLETS BY MOUTH TWICE A DAY 360 tablet 3   Multiple Vitamin (MULTIVITAMIN) tablet Take 1 tablet by mouth daily.     timolol (TIMOPTIC) 0.25 % ophthalmic solution 1 drop 2 (two) times daily.     traZODone (DESYREL) 50 MG tablet Take 1  tablet (50 mg total) by mouth at bedtime as needed for sleep. 60 tablet 0   buPROPion (WELLBUTRIN XL) 150 MG 24 hr tablet TAKE 1 TABLET BY MOUTH EVERY DAY (Patient not taking: Reported on 06/11/2023) 90 tablet 0   No current facility-administered medications on file prior to visit.   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  Tobacco Use   Smoking status: Former    Current packs/day: 2.00    Average packs/day: 2.0 packs/day for 15.0 years (30.0 ttl pk-yrs)    Types: Cigarettes   Smokeless tobacco: Never   Tobacco comments:    Quit 1983  Substance and Sexual Activity   Alcohol use: Yes    Comment: case of beer    Drug use: Not on file   Sexual activity: Not on file  Other Topics Concern   Not on file  Social History Narrative   Re-married, lives with wife   2 step-children, 2 granddaughters   Working at [employer] in [employer], Materials engineer   Social Determinants of Health   Financial Resource Strain: Low Risk  (09/17/2020)   Overall Financial Resource Strain (CARDIA)    Difficulty of Paying Living Expenses: Not very hard  Food Insecurity: Not on file  Transportation Needs: Not on file  Physical  Activity: Not on file  Stress: Not on file  Social Connections: Not on file  Intimate Partner Violence: Not on file   Family History  Problem Relation Age of Onset   Hypertension Mother    Parkinsonism Mother    Dementia Mother    Diabetes Mother    Stroke Father 12   Depression Sister    Anxiety disorder Sister    Heart disease Brother 49       CAD   Hyperlipidemia Brother    Hypertension Brother    Arthritis Neg Hx    Cancer Neg Hx    Alcohol abuse Neg Hx    Drug abuse Neg Hx    Colon cancer Neg Hx    Prostate cancer Neg Hx    Heart disease Brother 2       CAD      Review of Systems  All other systems reviewed and are negative.      Objective:   Physical Exam Vitals reviewed.  Constitutional:      General: He is not in acute distress.    Appearance: He is well-developed. He is not diaphoretic.  HENT:     Head: Normocephalic and atraumatic.     Right Ear: External ear normal.     Left Ear: External ear normal.     Nose:     Right Sinus: No maxillary sinus tenderness or frontal sinus tenderness.     Left Sinus: No maxillary sinus tenderness or frontal sinus tenderness.     Mouth/Throat:     Pharynx: No oropharyngeal exudate.  Eyes:     General: No scleral icterus.    Conjunctiva/sclera: Conjunctivae normal.     Pupils: Pupils are equal, round, and reactive to light.  Neck:  Thyroid: No thyromegaly.     Vascular: No JVD.   Cardiovascular:     Rate and Rhythm: Normal rate and regular rhythm.     Heart sounds: Normal heart sounds. No murmur heard.    No friction rub. No gallop.  Pulmonary:     Effort: Pulmonary effort is normal. No respiratory distress.     Breath sounds: No stridor. No wheezing, rhonchi or rales.  Abdominal:     General: Bowel sounds are normal. There is no distension.     Palpations: Abdomen is soft.     Tenderness: There is no abdominal tenderness. There is no guarding or rebound.  Musculoskeletal:     Cervical back: Neck  supple.  Lymphadenopathy:     Cervical: No cervical adenopathy.  Neurological:     Mental Status: He is alert and oriented to person, place, and time.     Cranial Nerves: No cranial nerve deficit.     Motor: No abnormal muscle tone.     Coordination: Coordination normal.     Deep Tendon Reflexes: Reflexes are normal and symmetric.      On exam, there is a soft tissue mass in the left supraclavicular fossa as diagrammed above it is poorly circumscribed without borders.    Assessment & Plan:  Neck mass - Plan: US Soft Tissue Head/Neck (NON-THYROID)  Type 2 diabetes mellitus without complication, without long-term current use of insulin (HCC) - Plan: Hemoglobin A1c, COMPLETE METABOLIC PANEL WITH GFR  Hypothyroidism, unspecified type - Plan: TSH  History of vitamin D deficiency - Plan: VITAMIN D 25 Hydroxy (Vit-D Deficiency, Fractures)  Alcoholic cirrhosis of liver without ascites (HCC) - Plan: VITAMIN D 25 Hydroxy (Vit-D Deficiency, Fractures) Because of the mass I will obtain an ultrasound.  I feel that this is the most likely a lipoma.  I will check a vitamin D level as requested by the patient.  He is quit drinking.  Therefore I will repeat his liver function test.  If persistently elevated I would recommend a workup for potential causes of hepatic steatosis other than alcohol.  Will check discussed management of hypothyroidism.  I will check an A1c to monitor management of his diabetes.  I suggested trying Vraylar or Rexulti in addition to the Pristiq for his depression.  Also offered the patient a referral to a psychiatrist.  He defers both of these at the present time and plans to work on exercise and natural remedies to help his depression.

## 2023-06-12 LAB — COMPLETE METABOLIC PANEL WITH GFR
AG Ratio: 1.1 (calc) (ref 1.0–2.5)
ALT: 39 U/L (ref 9–46)
AST: 31 U/L (ref 10–35)
Albumin: 3.9 g/dL (ref 3.6–5.1)
Alkaline phosphatase (APISO): 102 U/L (ref 35–144)
BUN: 18 mg/dL (ref 7–25)
CO2: 23 mmol/L (ref 20–32)
Calcium: 9.3 mg/dL (ref 8.6–10.3)
Chloride: 102 mmol/L (ref 98–110)
Creat: 1.2 mg/dL (ref 0.70–1.28)
Globulin: 3.4 g/dL (ref 1.9–3.7)
Glucose, Bld: 118 mg/dL — ABNORMAL HIGH (ref 65–99)
Potassium: 4.6 mmol/L (ref 3.5–5.3)
Sodium: 135 mmol/L (ref 135–146)
Total Bilirubin: 0.4 mg/dL (ref 0.2–1.2)
Total Protein: 7.3 g/dL (ref 6.1–8.1)
eGFR: 64 mL/min/{1.73_m2} (ref >=60)

## 2023-06-12 LAB — HEMOGLOBIN A1C
Hgb A1c MFr Bld: 6.6 %{Hb} — ABNORMAL HIGH (ref <5.7)
Mean Plasma Glucose: 143 mg/dL
eAG (mmol/L): 7.9 mmol/L

## 2023-06-12 LAB — VITAMIN D 25 HYDROXY (VIT D DEFICIENCY, FRACTURES): Vit D, 25-Hydroxy: 36 ng/mL (ref 30–100)

## 2023-06-12 LAB — TSH: TSH: 0.03 m[IU]/L — ABNORMAL LOW (ref 0.40–4.50)

## 2023-06-14 ENCOUNTER — Other Ambulatory Visit: Payer: Self-pay

## 2023-06-14 MED ORDER — LEVOTHYROXINE SODIUM 150 MCG PO TABS: 150.0000 ug | ORAL_TABLET | Freq: Every day | ORAL | 3 refills | Status: DC

## 2023-06-21 ENCOUNTER — Ambulatory Visit: Payer: Medicare Other | Admitting: Family Medicine

## 2023-06-22 DIAGNOSIS — N5201 Erectile dysfunction due to arterial insufficiency: Secondary | ICD-10-CM | POA: Diagnosis not present

## 2023-08-04 ENCOUNTER — Other Ambulatory Visit: Payer: Self-pay | Admitting: Family Medicine

## 2023-08-04 NOTE — Telephone Encounter (Signed)
Last OV 06/11/23 Requested Prescriptions  Pending Prescriptions Disp Refills   lisinopril (ZESTRIL) 20 MG tablet [Pharmacy Med Name: LISINOPRIL 20 MG TABLET] 90 tablet 1    Sig: TAKE 1 TABLET BY MOUTH EVERY DAY     Cardiovascular:  ACE Inhibitors Failed - 08/04/2023  1:12 AM      Failed - Valid encounter within last 6 months    Recent Outpatient Visits           1 year ago Type 2 diabetes mellitus without complication, without long-term current use of insulin (HCC)   [facility] Pickard, Priscille Heidelberg, MD   2 years ago Chronic cough   [facility] Donita Brooks, MD   2 years ago Upper respiratory tract infection, unspecified type   [facility] Medicine Valentino Nose, NP   2 years ago Hypothyroidism, unspecified type   [facility] Donita Brooks, MD   3 years ago Type 2 diabetes mellitus without complication, without long-term current use of insulin (HCC)   [facility] Pickard, Priscille Heidelberg, MD              Passed - Cr in normal range and within 180 days    Creat  Date Value Ref Range Status  06/11/2023 1.20 0.70 - 1.28 mg/dL Final   Creatinine, Urine  Date Value Ref Range Status  11/26/2022 167 20 - 320 mg/dL Final         Passed - K in normal range and within 180 days    Potassium  Date Value Ref Range Status  06/11/2023 4.6 3.5 - 5.3 mmol/L Final         Passed - Patient is not pregnant      Passed - Last BP in normal range    BP Readings from Last 1 Encounters:  06/11/23 124/72

## 2023-08-29 ENCOUNTER — Other Ambulatory Visit: Payer: Self-pay | Admitting: Family Medicine

## 2023-09-01 ENCOUNTER — Encounter: Payer: Self-pay | Admitting: Family Medicine

## 2023-09-02 ENCOUNTER — Other Ambulatory Visit: Payer: Self-pay | Admitting: Family Medicine

## 2023-09-02 MED ORDER — DESVENLAFAXINE ER 100 MG PO TB24: 100.0000 mg | ORAL_TABLET | Freq: Every day | ORAL | 3 refills | Status: DC

## 2023-09-03 MED ORDER — TRAZODONE HCL 50 MG PO TABS: 50.0000 mg | ORAL_TABLET | Freq: Every evening | ORAL | 0 refills | Status: DC | PRN

## 2023-09-06 ENCOUNTER — Other Ambulatory Visit: Payer: Self-pay | Admitting: Family Medicine

## 2023-09-06 MED ORDER — VENLAFAXINE HCL ER 150 MG PO CP24: 150.0000 mg | ORAL_CAPSULE | Freq: Every day | ORAL | 3 refills | Status: DC

## 2023-09-08 ENCOUNTER — Other Ambulatory Visit: Payer: Self-pay | Admitting: Family Medicine

## 2023-09-08 DIAGNOSIS — H35362 Drusen (degenerative) of macula, left eye: Secondary | ICD-10-CM | POA: Diagnosis not present

## 2023-09-08 DIAGNOSIS — E119 Type 2 diabetes mellitus without complications: Secondary | ICD-10-CM | POA: Diagnosis not present

## 2023-09-08 DIAGNOSIS — H524 Presbyopia: Secondary | ICD-10-CM | POA: Diagnosis not present

## 2023-09-08 DIAGNOSIS — H35371 Puckering of macula, right eye: Secondary | ICD-10-CM | POA: Diagnosis not present

## 2023-09-08 DIAGNOSIS — H02403 Unspecified ptosis of bilateral eyelids: Secondary | ICD-10-CM | POA: Diagnosis not present

## 2023-09-08 DIAGNOSIS — H52203 Unspecified astigmatism, bilateral: Secondary | ICD-10-CM | POA: Diagnosis not present

## 2023-09-08 DIAGNOSIS — H401112 Primary open-angle glaucoma, right eye, moderate stage: Secondary | ICD-10-CM | POA: Diagnosis not present

## 2023-09-09 NOTE — Telephone Encounter (Signed)
Requested Prescriptions  Refused Prescriptions Disp Refills   desvenlafaxine (PRISTIQ) 50 MG 24 hr tablet [Pharmacy Med Name: DESVENLAFAXINE SUCCNT ER 50 MG] 30 tablet 3    Sig: TAKE 1 TABLET BY MOUTH EVERY DAY     Psychiatry: Antidepressants - SNRI - desvenlafaxine & venlafaxine Failed - 09/08/2023  1:33 AM      Failed - Completed PHQ-2 or PHQ-9 in the last 360 days      Failed - Valid encounter within last 6 months    Recent Outpatient Visits           1 year ago Type 2 diabetes mellitus without complication, without long-term current use of insulin (HCC)   [facility] Pickard, Priscille Heidelberg, MD   2 years ago Chronic cough   [facility] Tanya Nones, Priscille Heidelberg, MD   2 years ago Upper respiratory tract infection, unspecified type   [facility] Medicine Valentino Nose, NP   2 years ago Hypothyroidism, unspecified type   [facility] Medicine Donita Brooks, MD   3 years ago Type 2 diabetes mellitus without complication, without long-term current use of insulin (HCC)   [facility] Medicine Pickard, Priscille Heidelberg, MD              Failed - Lipid Panel in normal range within the last 12 months    Cholesterol  Date Value Ref Range Status  11/26/2022 173 <200 mg/dL Final   LDL Cholesterol (Calc)  Date Value Ref Range Status  11/26/2022 89 mg/dL (calc) Final    Comment:    Reference range: <100 . Desirable range <100 mg/dL for primary prevention;   <70 mg/dL for patients with CHD or diabetic patients  with > or = 2 CHD risk factors. Marland Kitchen LDL-C is now calculated using the Martin-Hopkins  calculation, which is a validated novel method providing  better accuracy than the Friedewald equation in the  estimation of LDL-C.  Horald Pollen et al. Lenox Ahr. 1610;960(45): 2061-2068  (http://education.[facility].com/faq/FAQ164)    HDL  Date Value Ref Range Status  11/26/2022 60 > OR = 40 mg/dL Final   Triglycerides  Date Value Ref  Range Status  11/26/2022 140 <150 mg/dL Final         Passed - Cr in normal range and within 360 days    Creat  Date Value Ref Range Status  06/11/2023 1.20 0.70 - 1.28 mg/dL Final   Creatinine, Urine  Date Value Ref Range Status  11/26/2022 167 20 - 320 mg/dL Final         Passed - Last BP in normal range    BP Readings from Last 1 Encounters:  06/11/23 124/72

## 2023-09-29 ENCOUNTER — Other Ambulatory Visit: Payer: Self-pay | Admitting: Family Medicine

## 2023-10-01 NOTE — Telephone Encounter (Signed)
Requested Prescriptions  Pending Prescriptions Disp Refills   venlafaxine XR (EFFEXOR-XR) 150 MG 24 hr capsule [Pharmacy Med Name: VENLAFAXINE HCL ER 150 MG CAP] 90 capsule 1    Sig: TAKE 1 CAPSULE BY MOUTH DAILY WITH BREAKFAST.     Psychiatry: Antidepressants - SNRI - desvenlafaxine & venlafaxine Failed - 09/29/2023 10:30 AM      Failed - Completed PHQ-2 or PHQ-9 in the last 360 days      Failed - Valid encounter within last 6 months    Recent Outpatient Visits           1 year ago Type 2 diabetes mellitus without complication, without long-term current use of insulin (HCC)   [facility] Pickard, Priscille Heidelberg, MD   2 years ago Chronic cough   [facility] Tanya Nones, Priscille Heidelberg, MD   2 years ago Upper respiratory tract infection, unspecified type   [facility] Medicine Valentino Nose, NP   3 years ago Hypothyroidism, unspecified type   [facility] Medicine Donita Brooks, MD   3 years ago Type 2 diabetes mellitus without complication, without long-term current use of insulin (HCC)   [facility] Medicine Pickard, Priscille Heidelberg, MD              Failed - Lipid Panel in normal range within the last 12 months    Cholesterol  Date Value Ref Range Status  11/26/2022 173 <200 mg/dL Final   LDL Cholesterol (Calc)  Date Value Ref Range Status  11/26/2022 89 mg/dL (calc) Final    Comment:    Reference range: <100 . Desirable range <100 mg/dL for primary prevention;   <70 mg/dL for patients with CHD or diabetic patients  with > or = 2 CHD risk factors. Marland Kitchen LDL-C is now calculated using the Martin-Hopkins  calculation, which is a validated novel method providing  better accuracy than the Friedewald equation in the  estimation of LDL-C.  Horald Pollen et al. Lenox Ahr. 6578;469(62): 2061-2068  (http://education.[facility].com/faq/FAQ164)    HDL  Date Value Ref Range Status  11/26/2022 60 > OR = 40 mg/dL Final   Triglycerides   Date Value Ref Range Status  11/26/2022 140 <150 mg/dL Final         Passed - Cr in normal range and within 360 days    Creat  Date Value Ref Range Status  06/11/2023 1.20 0.70 - 1.28 mg/dL Final   Creatinine, Urine  Date Value Ref Range Status  11/26/2022 167 20 - 320 mg/dL Final         Passed - Last BP in normal range    BP Readings from Last 1 Encounters:  06/11/23 124/72

## 2023-10-14 ENCOUNTER — Other Ambulatory Visit: Payer: Self-pay

## 2023-10-14 ENCOUNTER — Encounter: Payer: Self-pay | Admitting: Family Medicine

## 2023-10-14 DIAGNOSIS — E119 Type 2 diabetes mellitus without complications: Secondary | ICD-10-CM

## 2023-10-14 MED ORDER — METFORMIN HCL ER 500 MG PO TB24: 1000.0000 mg | ORAL_TABLET | Freq: Two times a day (BID) | ORAL | 3 refills | Status: DC

## 2023-10-29 ENCOUNTER — Other Ambulatory Visit: Payer: Self-pay | Admitting: Family Medicine

## 2023-11-01 ENCOUNTER — Other Ambulatory Visit: Payer: Self-pay

## 2023-11-01 NOTE — Telephone Encounter (Signed)
 Prescription Request  11/01/2023  LOV: 06/11/23  What is the name of the medication or equipment? lisinopril  (ZESTRIL ) 20 MG tablet [[id number]]   Have you contacted your pharmacy to request a refill? Yes   Which pharmacy would you like this sent to?  [id number] [place], [place] - [address] [place] [address] Phone: [phone] Fax: [phone]    Patient notified that their request is being sent to the clinical staff for review and that they should receive a response within 2 business days.   Please advise at [facility] [phone]

## 2023-11-03 ENCOUNTER — Other Ambulatory Visit: Payer: Self-pay | Admitting: Family Medicine

## 2023-11-03 MED ORDER — LISINOPRIL 20 MG PO TABS: 20.0000 mg | ORAL_TABLET | Freq: Every day | ORAL | 0 refills | Status: DC

## 2023-11-04 NOTE — Telephone Encounter (Signed)
 Requested Prescriptions  Refused Prescriptions Disp Refills   lisinopril  (ZESTRIL ) 20 MG tablet 90 tablet 0    Sig: Take 1 tablet (20 mg total) by mouth daily.     Cardiovascular:  ACE Inhibitors Failed - 11/04/2023  8:38 AM      Failed - Valid encounter within last 6 months    Recent Outpatient Visits           1 year ago Type 2 diabetes mellitus without complication, without long-term current use of insulin (HCC)   [facility] Medicine Pickard, Butler DASEN, MD   2 years ago Chronic cough   [facility] Duanne Butler DASEN, MD   2 years ago Upper respiratory tract infection, unspecified type   [facility] Medicine Chandra Harlene LABOR, NP   3 years ago Hypothyroidism, unspecified type   [facility] Duanne Butler DASEN, MD   3 years ago Type 2 diabetes mellitus without complication, without long-term current use of insulin (HCC)   [facility] Pickard, Butler DASEN, MD              Passed - Cr in normal range and within 180 days    Creat  Date Value Ref Range Status  06/11/2023 1.20 0.70 - 1.28 mg/dL Final   Creatinine, Urine  Date Value Ref Range Status  11/26/2022 167 20 - 320 mg/dL Final         Passed - K in normal range and within 180 days    Potassium  Date Value Ref Range Status  06/11/2023 4.6 3.5 - 5.3 mmol/L Final         Passed - Patient is not pregnant      Passed - Last BP in normal range    BP Readings from Last 1 Encounters:  06/11/23 124/72

## 2023-11-16 ENCOUNTER — Ambulatory Visit (INDEPENDENT_AMBULATORY_CARE_PROVIDER_SITE_OTHER): Payer: Medicare Other | Admitting: Family Medicine

## 2023-11-16 ENCOUNTER — Ambulatory Visit (HOSPITAL_COMMUNITY)
Admission: RE | Admit: 2023-11-16 | Discharge: 2023-11-16 | Disposition: A | Discharge status: Home / Self Care | Payer: Medicare Other | Source: Ambulatory Visit | Attending: Family Medicine | Admitting: Family Medicine

## 2023-11-16 ENCOUNTER — Other Ambulatory Visit: Payer: Self-pay | Admitting: Family Medicine

## 2023-11-16 ENCOUNTER — Encounter: Payer: Self-pay | Admitting: Family Medicine

## 2023-11-16 VITALS — BP 120/72 | HR 68 | Temp 97.9°F | Ht 66.0 in | Wt 200.0 lb

## 2023-11-16 DIAGNOSIS — R109 Unspecified abdominal pain: Secondary | ICD-10-CM | POA: Insufficient documentation

## 2023-11-16 DIAGNOSIS — Z9889 Other specified postprocedural states: Secondary | ICD-10-CM | POA: Diagnosis not present

## 2023-11-16 MED ORDER — LEVOFLOXACIN 500 MG PO TABS: 500.0000 mg | ORAL_TABLET | Freq: Every day | ORAL | 0 refills | Status: AC

## 2023-11-16 NOTE — Progress Notes (Signed)
++  Subjective:    Patient ID: Shaun Gomez, male    DOB: October 02, 1951, 73 y.o.   MRN: [id number] Patient presents with right flank pain.  He states that he is been dealing with this pain off and on for a few months.  However recently the pain was so severe that he had to simply just lay down because of the pain.  He reports that if he twists he can feel the pain in the right flank.  He denies any cough.  He denies any pleurisy.  He denies any hemoptysis.  He denies any hematuria or dysuria or urgency or frequency.  However he also does not have any pain if he bends over or if he lifts heavy objects.  The pain is a constant pressure-like pain.  On exam today the patient has prominent crackles in the right lower lobe directly in the area where he is experiencing pain.  Otherwise he is totally asymptomatic from a pulmonary standpoint. Past Medical History:  Diagnosis Date   CAD (coronary artery disease)    Depression 07/1993   Diabetes mellitus without complication (HCC)    Hyperlipidemia 02/1996   Hypertension pre 08/25/1996   OSA on CPAP    Thyroid disease 07/1993   Hypothyroidism   Past Surgical History:  Procedure Laterality Date   APPENDECTOMY  3 YOA   CATARACT EXTRACTION, BILATERAL     CORONARY ARTERY BYPASS GRAFT  02/1996   X 2 EF 30%   MI S/P Caverject Injection  02/1996   RETINAL DETACHMENT SURGERY     Sleep Study  01/03/2006   Severe sleep apnea - 88 events per hour )2 sats down to 50% ?   Stress Cardiolite  03/1996   EF 30%   Current Outpatient Medications on File Prior to Visit  Medication Sig Dispense Refill   aspirin 325 MG tablet Take 325 mg by mouth daily.     buPROPion (WELLBUTRIN XL) 150 MG 24 hr tablet TAKE 1 TABLET BY MOUTH EVERY DAY (Patient not taking: Reported on 06/11/2023) 90 tablet 0   dapagliflozin propanediol (FARXIGA) 10 MG TABS tablet Take 1 tablet (10 mg total) by mouth daily before breakfast. 90 tablet 5   diazepam (VALIUM) 5 MG tablet Take 1 tablet (5 mg  total) by mouth every 12 (twelve) hours as needed for anxiety (take 30 minutes before scan). 10 tablet 0   latanoprost (XALATAN) 0.005 % ophthalmic solution Place 1 drop into both eyes at bedtime.      levocetirizine (XYZAL ALLERGY 24HR) 5 MG tablet Take 1 tablet (5 mg total) by mouth every evening. 30 tablet 3   levothyroxine (SYNTHROID) 150 MCG tablet Take 1 tablet (150 mcg total) by mouth daily. 90 tablet 3   lisinopril (ZESTRIL) 20 MG tablet Take 1 tablet (20 mg total) by mouth daily. 90 tablet 0   metFORMIN (GLUCOPHAGE-XR) 500 MG 24 hr tablet Take 2 tablets (1,000 mg total) by mouth 2 (two) times daily. 360 tablet 3   Multiple Vitamin (MULTIVITAMIN) tablet Take 1 tablet by mouth daily.     neomycin-polymyxin-hydrocortisone (CORTISPORIN) OTIC solution Place 3 drops into the right ear 4 (four) times daily. 10 mL 0   sildenafil (VIAGRA) 100 MG tablet Take 0.5-1 tablets (50-100 mg total) by mouth daily as needed for erectile dysfunction. 5 tablet 11   timolol (TIMOPTIC) 0.25 % ophthalmic solution 1 drop 2 (two) times daily.     traZODone (DESYREL) 50 MG tablet TAKE 1 TABLET BY MOUTH AT  BEDTIME AS NEEDED FOR SLEEP. 90 tablet 1   venlafaxine XR (EFFEXOR-XR) 150 MG 24 hr capsule TAKE 1 CAPSULE BY MOUTH DAILY WITH BREAKFAST. 90 capsule 1   No current facility-administered medications on file prior to visit.   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  Tobacco Use   Smoking status: Former    Current packs/day: 2.00    Average packs/day: 2.0 packs/day for 15.0 years (30.0 ttl pk-yrs)    Types: Cigarettes   Smokeless tobacco:  Never   Tobacco comments:    Quit 1983  Substance and Sexual Activity   Alcohol use: Yes    Comment: case of beer    Drug use: Not on file   Sexual activity: Not on file  Other Topics Concern   Not on file  Social History Narrative   Re-married, lives with wife   2 step-children, 2 granddaughters   Working at [employer] in [employer], Materials engineer   Social Drivers of Health   Financial Resource Strain: Low Risk  (09/17/2020)   Overall Financial Resource Strain (CARDIA)    Difficulty of Paying Living Expenses: Not very hard  Food Insecurity: Not on file  Transportation Needs: Not on file  Physical Activity: Not on file  Stress: Not on file  Social Connections: Not on file  Intimate Partner Violence: Not on file   Family History  Problem Relation Age of Onset   Hypertension Mother    Parkinsonism Mother    Dementia Mother    Diabetes Mother    Stroke Father 36   Depression Sister    Anxiety disorder Sister    Heart disease Brother 40       CAD   Hyperlipidemia Brother    Hypertension Brother    Arthritis Neg Hx    Cancer Neg Hx    Alcohol abuse Neg Hx    Drug abuse Neg Hx    Colon cancer Neg Hx    Prostate cancer Neg Hx    Heart disease Brother 78       CAD      Review of Systems  All other systems reviewed and are negative.      Objective:   Physical Exam Vitals reviewed.  Constitutional:      General: He is not in acute distress.    Appearance: He is well-developed. He is not diaphoretic.  HENT:     Head: Normocephalic and atraumatic.     Right Ear: External ear normal.     Left Ear: External ear normal.     Nose:     Right Sinus: No maxillary sinus tenderness or frontal sinus tenderness.     Left Sinus: No maxillary sinus tenderness or frontal sinus tenderness.     Mouth/Throat:     Pharynx: No oropharyngeal exudate.  Eyes:     General: No scleral icterus.    Conjunctiva/sclera: Conjunctivae normal.     Pupils: Pupils are equal,  round, and reactive to light.  Neck:     Thyroid: No thyromegaly.     Vascular: No  JVD.   Cardiovascular:     Rate and Rhythm: Normal rate and regular rhythm.     Heart sounds: Normal heart sounds. No murmur heard.    No friction rub. No gallop.  Pulmonary:     Effort: Pulmonary effort is normal. No respiratory distress.     Breath sounds: No stridor. Rales present. No wheezing or rhonchi.    Abdominal:     General: Bowel sounds are normal. There is no distension.     Palpations: Abdomen is soft.     Tenderness: There is no abdominal tenderness. There is no guarding or rebound.  Musculoskeletal:     Cervical back: Neck supple.     Thoracic back: No spasms or tenderness. Normal range of motion.     Lumbar back: No spasms, tenderness or bony tenderness. Normal range of motion.  Lymphadenopathy:     Cervical: No cervical adenopathy.  Neurological:     Mental Status: He is alert and oriented to person, place, and time.     Cranial Nerves: No cranial nerve deficit.     Motor: No abnormal muscle tone.     Coordination: Coordination normal.     Deep Tendon Reflexes: Reflexes are normal and symmetric.         Assessment & Plan:  Right flank pain - Plan: DG Chest 2 View Very atypical presentation.  Given the location I would suspect a pulled muscle in his back versus a kidney stone.  However the patient has prominent crackles in his right lower lobe that I cannot explain.  Therefore I have sent the patient for a chest x-ray to evaluate for possible pneumonia or to explain the abnormal breath sounds.  If the chest x-ray is clear I will treat the patient for possible muscle spasms with Flexeril.  If the chest x-ray shows an opacity I will treat the patient for pneumonia using Augmentin and a Z-Pak.  Await results of the chest x-ray.

## 2023-11-22 ENCOUNTER — Other Ambulatory Visit: Payer: Self-pay | Admitting: Family Medicine

## 2023-11-22 MED ORDER — CYCLOBENZAPRINE HCL 10 MG PO TABS: 10.0000 mg | ORAL_TABLET | Freq: Three times a day (TID) | ORAL | 0 refills | Status: DC | PRN

## 2023-11-30 ENCOUNTER — Other Ambulatory Visit: Payer: Self-pay | Admitting: Family Medicine

## 2023-11-30 DIAGNOSIS — R0789 Other chest pain: Secondary | ICD-10-CM

## 2023-11-30 DIAGNOSIS — R091 Pleurisy: Secondary | ICD-10-CM

## 2023-12-03 ENCOUNTER — Ambulatory Visit
Admission: RE | Admit: 2023-12-03 | Discharge: 2023-12-03 | Disposition: A | Discharge status: Home / Self Care | Payer: Medicare Other | Source: Ambulatory Visit | Attending: Family Medicine | Admitting: Family Medicine

## 2023-12-03 DIAGNOSIS — R091 Pleurisy: Secondary | ICD-10-CM

## 2023-12-03 DIAGNOSIS — R0789 Other chest pain: Secondary | ICD-10-CM

## 2023-12-03 MED ORDER — IOPAMIDOL (ISOVUE-370) INJECTION 76%
500.0000 mL | Freq: Once | INTRAVENOUS | Status: AC | PRN
  Administered 2023-12-03: 70 mL via INTRAVENOUS

## 2023-12-07 ENCOUNTER — Encounter: Payer: Self-pay | Admitting: Family Medicine

## 2023-12-09 ENCOUNTER — Other Ambulatory Visit: Payer: Self-pay | Admitting: Family Medicine

## 2023-12-09 DIAGNOSIS — R109 Unspecified abdominal pain: Secondary | ICD-10-CM

## 2024-01-24 ENCOUNTER — Ambulatory Visit (INDEPENDENT_AMBULATORY_CARE_PROVIDER_SITE_OTHER): Admitting: Family Medicine

## 2024-01-24 ENCOUNTER — Ambulatory Visit: Admitting: Family Medicine

## 2024-01-24 ENCOUNTER — Encounter: Payer: Self-pay | Admitting: Family Medicine

## 2024-01-24 VITALS — BP 130/72 | HR 74 | Temp 97.7°F | Ht 66.0 in | Wt 200.4 lb

## 2024-01-24 DIAGNOSIS — E039 Hypothyroidism, unspecified: Secondary | ICD-10-CM

## 2024-01-24 DIAGNOSIS — E119 Type 2 diabetes mellitus without complications: Secondary | ICD-10-CM

## 2024-01-24 DIAGNOSIS — R5383 Other fatigue: Secondary | ICD-10-CM | POA: Diagnosis not present

## 2024-01-24 NOTE — Progress Notes (Signed)
 Subjective:    Patient ID: Shaun Gomez, male    DOB: 01-May-1951, 73 y.o.   MRN: [id number] Patient presents with right flank pain.  He states that he is been dealing with this pain off and on for a few months.  However recently the pain was so severe that he had to simply just lay down because of the pain.  He reports that if he twists he can feel the pain in the right flank.  He denies any cough.  He denies any pleurisy.  He denies any hemoptysis.  He denies any hematuria or dysuria or urgency or frequency.  However he also does not have any pain if he bends over or if he lifts heavy objects.  The pain is a constant pressure-like pain.  On exam today the patient has prominent crackles in the right lower lobe directly in the area where he is experiencing pain.  Otherwise he is totally asymptomatic from a pulmonary standpoint. Wt Readings from Last 3 Encounters:  01/24/24 200 lb 6.4 oz (90.9 kg)  11/16/23 200 lb (90.7 kg)  06/11/23 197 lb (89.4 kg)   01/24/24 Patient presents today complaining of lack of energy.  He specifically gives the example of this weekend.  He was picking up sticks and limbs in his yard.  He states that he ran out of energy and simply had to sit down.  He does have a cardiac history however he is adamant that he was not having any chest pain or shortness of breath or dyspnea on exertion.  Instead it was profound fatigue that caused him to sit down.  He believes it may be psychological.  He states that he has no zeal for life.  His motivation is lacking.  He stopped drinking alcohol several months ago.  He has not seen any weight loss.  From a cardiology standpoint he denies chest pain orthopnea.  He has sleep apnea but he is wearing CPAP machine.  From a pulmonary standpoint he denies any shortness of breath or pleurisy or hemoptysis.  From a GI standpoint he denies any melena or hematochezia or abdominal pain.  No palpable lymphadenopathy in his neck or in his body.  He denies  any masses or rashes.  He did decrease his thyroid as directed in August.  He is overdue to recheck his TSH. Past Medical History:  Diagnosis Date   CAD (coronary artery disease)    Depression 07/1993   Diabetes mellitus without complication (HCC)    Hyperlipidemia 02/1996   Hypertension pre 08/25/1996   OSA on CPAP    Thyroid disease 07/1993   Hypothyroidism   Past Surgical History:  Procedure Laterality Date   APPENDECTOMY  3 YOA   CATARACT EXTRACTION, BILATERAL     CORONARY ARTERY BYPASS GRAFT  02/1996   X 2 EF 30%   MI S/P Caverject Injection  02/1996   RETINAL DETACHMENT SURGERY     Sleep Study  01/03/2006   Severe sleep apnea - 88 events per hour )2 sats down to 50% ?   Stress Cardiolite  03/1996   EF 30%   Current Outpatient Medications on File Prior to Visit  Medication Sig Dispense Refill   aspirin 325 MG tablet Take 325 mg by mouth daily.     buPROPion (WELLBUTRIN XL) 150 MG 24 hr tablet TAKE 1 TABLET BY MOUTH EVERY DAY (Patient not taking: Reported on 06/11/2023) 90 tablet 0   cyclobenzaprine (FLEXERIL) 10 MG tablet Take 1 tablet (  10 mg total) by mouth 3 (three) times daily as needed for muscle spasms. 30 tablet 0   dapagliflozin propanediol (FARXIGA) 10 MG TABS tablet Take 1 tablet (10 mg total) by mouth daily before breakfast. 90 tablet 5   diazepam (VALIUM) 5 MG tablet Take 1 tablet (5 mg total) by mouth every 12 (twelve) hours as needed for anxiety (take 30 minutes before scan). 10 tablet 0   latanoprost (XALATAN) 0.005 % ophthalmic solution Place 1 drop into both eyes at bedtime.      levocetirizine (XYZAL ALLERGY 24HR) 5 MG tablet Take 1 tablet (5 mg total) by mouth every evening. 30 tablet 3   levothyroxine (SYNTHROID) 150 MCG tablet Take 1 tablet (150 mcg total) by mouth daily. 90 tablet 3   lisinopril (ZESTRIL) 20 MG tablet Take 1 tablet (20 mg total) by mouth daily. 90 tablet 0   metFORMIN (GLUCOPHAGE-XR) 500 MG 24 hr tablet Take 2 tablets (1,000 mg total) by  mouth 2 (two) times daily. 360 tablet 3   Multiple Vitamin (MULTIVITAMIN) tablet Take 1 tablet by mouth daily.     neomycin-polymyxin-hydrocortisone (CORTISPORIN) OTIC solution Place 3 drops into the right ear 4 (four) times daily. 10 mL 0   sildenafil (VIAGRA) 100 MG tablet Take 0.5-1 tablets (50-100 mg total) by mouth daily as needed for erectile dysfunction. 5 tablet 11   timolol (TIMOPTIC) 0.25 % ophthalmic solution 1 drop 2 (two) times daily.     traZODone (DESYREL) 50 MG tablet TAKE 1 TABLET BY MOUTH AT BEDTIME AS NEEDED FOR SLEEP. 90 tablet 1   venlafaxine XR (EFFEXOR-XR) 150 MG 24 hr capsule TAKE 1 CAPSULE BY MOUTH DAILY WITH BREAKFAST. 90 capsule 1   No current facility-administered medications on file prior to visit.   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  Tobacco Use   Smoking status: Former    Current packs/day: 2.00    Average packs/day: 2.0 packs/day for 15.0 years (30.0 ttl pk-yrs)    Types: Cigarettes   Smokeless tobacco: Never   Tobacco comments:    Quit 1983  Substance and Sexual Activity   Alcohol use: Yes    Comment: case of beer    Drug use: Not on file   Sexual activity: Not on file  Other Topics Concern   Not on file  Social History Narrative   Re-married, lives with wife   2 step-children, 2 granddaughters   Working at [employer] in [employer], Materials engineer   Social Drivers of Health   Financial Resource Strain: Low Risk  (09/17/2020)   Overall Financial Resource Strain (CARDIA)    Difficulty of Paying Living Expenses: Not very hard  Food Insecurity: Not on file   Transportation Needs: Not on file  Physical Activity: Not on file  Stress: Not on file  Social Connections: Not on file  Intimate Partner Violence: Not on file   Family History  Problem Relation Age of Onset   Hypertension Mother    Parkinsonism Mother    Dementia Mother    Diabetes Mother  Stroke Father 41   Depression Sister    Anxiety disorder Sister    Heart disease Brother 30       CAD   Hyperlipidemia Brother    Hypertension Brother    Arthritis Neg Hx    Cancer Neg Hx    Alcohol abuse Neg Hx    Drug abuse Neg Hx    Colon cancer Neg Hx    Prostate cancer Neg Hx    Heart disease Brother 13       CAD      Review of Systems  All other systems reviewed and are negative.      Objective:   Physical Exam Vitals reviewed.  Constitutional:      General: He is not in acute distress.    Appearance: He is well-developed. He is not diaphoretic.  HENT:     Head: Normocephalic and atraumatic.     Right Ear: External ear normal.     Left Ear: External ear normal.     Nose:     Right Sinus: No maxillary sinus tenderness or frontal sinus tenderness.     Left Sinus: No maxillary sinus tenderness or frontal sinus tenderness.     Mouth/Throat:     Pharynx: No oropharyngeal exudate.  Eyes:     General: No scleral icterus.    Conjunctiva/sclera: Conjunctivae normal.     Pupils: Pupils are equal, round, and reactive to light.  Neck:     Thyroid: No thyromegaly.     Vascular: No JVD.  Cardiovascular:     Rate and Rhythm: Normal rate and regular rhythm.     Heart sounds: Normal heart sounds. No murmur heard.    No friction rub. No gallop.  Pulmonary:     Effort: Pulmonary effort is normal. No respiratory distress.     Breath sounds: No stridor. No wheezing, rhonchi or rales.  Abdominal:     General: Bowel sounds are normal. There is no distension.     Palpations: Abdomen is soft.     Tenderness: There is no abdominal tenderness. There is no guarding or rebound.   Musculoskeletal:     Cervical back: Neck supple.     Thoracic back: No spasms or tenderness. Normal range of motion.     Lumbar back: No spasms, tenderness or bony tenderness. Normal range of motion.  Lymphadenopathy:     Cervical: No cervical adenopathy.  Neurological:     Mental Status: He is alert and oriented to person, place, and time.     Cranial Nerves: No cranial nerve deficit.     Motor: No abnormal muscle tone.     Coordination: Coordination normal.     Deep Tendon Reflexes: Reflexes are normal and symmetric.         Assessment & Plan:  Type 2 diabetes mellitus without complication, without long-term current use of insulin (HCC) - Plan: CBC with Differential/Platelet, COMPLETE METABOLIC PANEL WITHOUT GFR, Lipid panel, TSH, Hemoglobin A1c, Vitamin B12, Testosterone Total,Free,Bio, Males  Hypothyroidism, unspecified type - Plan: CBC with Differential/Platelet, COMPLETE METABOLIC PANEL WITHOUT GFR, Lipid panel, TSH, Hemoglobin A1c, Vitamin B12, Testosterone Total,Free,Bio, Males  Fatigue, unspecified type - Plan: CBC with Differential/Platelet, COMPLETE METABOLIC PANEL WITHOUT GFR, Lipid panel, TSH, Hemoglobin A1c, Vitamin B12, Testosterone Total,Free,Bio, Males His physical exam today is normal.  His review of systems is reassuring other than his fatigue.  Therefore I would like to perform a chemical workup to rule out metabolic causes of fatigue.  I will check  a CBC to rule out anemias or any bone marrow malignancy.  I will check a CMP to evaluate for any liver or kidney dysfunction.  I will check a hemoglobin A1c to rule out uncontrolled diabetes.  Check a TSH to evaluate for any evidence of uncontrolled hypothyroidism.  Check a B12 level.  Check a testosterone level.  Patient denies symptoms that would suggest congestive heart failure or cardiac ischemia.  Consider a stress test if fatigue continues given his past medical history.  Also consider switching antidepressants if lab  work is normal.

## 2024-01-25 LAB — COMPLETE METABOLIC PANEL WITHOUT GFR
AG Ratio: 1.1 (calc) (ref 1.0–2.5)
ALT: 26 U/L (ref 9–46)
AST: 29 U/L (ref 10–35)
Albumin: 4 g/dL (ref 3.6–5.1)
Alkaline phosphatase (APISO): 108 U/L (ref 35–144)
BUN/Creatinine Ratio: 17 (calc) (ref 6–22)
BUN: 22 mg/dL (ref 7–25)
CO2: 26 mmol/L (ref 20–32)
Calcium: 9.5 mg/dL (ref 8.6–10.3)
Chloride: 101 mmol/L (ref 98–110)
Creat: 1.33 mg/dL — ABNORMAL HIGH (ref 0.70–1.28)
Globulin: 3.5 g/dL (ref 1.9–3.7)
Glucose, Bld: 150 mg/dL — ABNORMAL HIGH (ref 65–99)
Potassium: 4.9 mmol/L (ref 3.5–5.3)
Sodium: 136 mmol/L (ref 135–146)
Total Bilirubin: 0.3 mg/dL (ref 0.2–1.2)
Total Protein: 7.5 g/dL (ref 6.1–8.1)

## 2024-01-25 LAB — CBC WITH DIFFERENTIAL/PLATELET
Absolute Lymphocytes: 3096 {cells}/uL (ref 850–3900)
Absolute Monocytes: 1278 {cells}/uL — ABNORMAL HIGH (ref 200–950)
Basophils Absolute: 63 {cells}/uL (ref 0–200)
Basophils Relative: 0.7 %
Eosinophils Absolute: 207 {cells}/uL (ref 15–500)
Eosinophils Relative: 2.3 %
HCT: 44.9 % (ref 38.5–50.0)
Hemoglobin: 15.1 g/dL (ref 13.2–17.1)
MCH: 32.9 pg (ref 27.0–33.0)
MCHC: 33.6 g/dL (ref 32.0–36.0)
MCV: 97.8 fL (ref 80.0–100.0)
MPV: 10.7 fL (ref 7.5–12.5)
Monocytes Relative: 14.2 %
Neutro Abs: 4356 {cells}/uL (ref 1500–7800)
Neutrophils Relative %: 48.4 %
Platelets: 252 10*3/uL (ref 140–400)
RBC: 4.59 10*6/uL (ref 4.20–5.80)
RDW: 12.7 % (ref 11.0–15.0)
Total Lymphocyte: 34.4 %
WBC: 9 10*3/uL (ref 3.8–10.8)

## 2024-01-25 LAB — LIPID PANEL
Cholesterol: 188 mg/dL (ref <200)
HDL: 52 mg/dL (ref >=40)
LDL Cholesterol (Calc): 108 mg/dL — ABNORMAL HIGH
Non-HDL Cholesterol (Calc): 136 mg/dL — ABNORMAL HIGH (ref <130)
Total CHOL/HDL Ratio: 3.6 (calc) (ref <5.0)
Triglycerides: 162 mg/dL — ABNORMAL HIGH (ref <150)

## 2024-01-25 LAB — TESTOSTERONE TOTAL,FREE,BIO, MALES
Albumin: 4 g/dL (ref 3.6–5.1)
Sex Hormone Binding: 57 nmol/L (ref 22–77)
Testosterone, Bioavailable: 68.6 ng/dL (ref 15.0–150.0)
Testosterone, Free: 37.3 pg/mL (ref 6.0–73.0)
Testosterone: 445 ng/dL (ref 250–827)

## 2024-01-25 LAB — HEMOGLOBIN A1C
Hgb A1c MFr Bld: 7.5 %{Hb} — ABNORMAL HIGH (ref <5.7)
Mean Plasma Glucose: 169 mg/dL
eAG (mmol/L): 9.3 mmol/L

## 2024-01-25 LAB — VITAMIN B12: Vitamin B-12: 950 pg/mL (ref 200–1100)

## 2024-01-25 LAB — TSH: TSH: 0.22 m[IU]/L — ABNORMAL LOW (ref 0.40–4.50)

## 2024-01-28 ENCOUNTER — Other Ambulatory Visit: Payer: Self-pay | Admitting: Family Medicine

## 2024-01-28 NOTE — Telephone Encounter (Signed)
 Requested Prescriptions  Pending Prescriptions Disp Refills   lisinopril (ZESTRIL) 20 MG tablet [Pharmacy Med Name: LISINOPRIL 20 MG TABLET] 90 tablet 0    Sig: TAKE 1 TABLET BY MOUTH EVERY DAY     Cardiovascular:  ACE Inhibitors Failed - 01/28/2024  3:43 PM      Failed - Cr in normal range and within 180 days    Creat  Date Value Ref Range Status  01/24/2024 1.33 (H) 0.70 - 1.28 mg/dL Final   Creatinine, Urine  Date Value Ref Range Status  11/26/2022 167 20 - 320 mg/dL Final         Failed - Valid encounter within last 6 months    Recent Outpatient Visits           4 days ago Type 2 diabetes mellitus without complication, without long-term current use of insulin (HCC)   [place] [facility] Donita Brooks, MD   2 months ago Right flank pain   [place] [facility] Tanya Nones, Priscille Heidelberg, MD   7 months ago Neck mass   [place] [facility] Donita Brooks, MD   8 months ago Chest wall pain   [place] [facility] Donita Brooks, MD   8 months ago Chest wall pain   [place] [facility] Donita Brooks, MD              Passed - K in normal range and within 180 days    Potassium  Date Value Ref Range Status  01/24/2024 4.9 3.5 - 5.3 mmol/L Final         Passed - Patient is not pregnant      Passed - Last BP in normal range    BP Readings from Last 1 Encounters:  01/24/24 130/72

## 2024-01-29 ENCOUNTER — Other Ambulatory Visit: Payer: Self-pay | Admitting: Family Medicine

## 2024-01-31 NOTE — Telephone Encounter (Signed)
 Requested Prescriptions  Pending Prescriptions Disp Refills   levothyroxine (SYNTHROID) 175 MCG tablet [Pharmacy Med Name: LEVOTHYROXINE 175 MCG TABLET] 90 tablet 3    Sig: TAKE 1 TABLET BY MOUTH EVERY DAY     Endocrinology:  Hypothyroid Agents Failed - 01/31/2024  2:36 PM      Failed - TSH in normal range and within 360 days    TSH  Date Value Ref Range Status  01/24/2024 0.22 (L) 0.40 - 4.50 mIU/L Final         Passed - Valid encounter within last 12 months    Recent Outpatient Visits           1 week ago Type 2 diabetes mellitus without complication, without long-term current use of insulin (HCC)   [place] [facility] Pickard, Priscille Heidelberg, MD   2 months ago Right flank pain   [place] [facility] Tanya Nones, Priscille Heidelberg, MD   7 months ago Neck mass   [place] [facility] Donita Brooks, MD   8 months ago Chest wall pain   [place] [facility] Donita Brooks, MD   8 months ago Chest wall pain   [place] [facility] Donita Brooks, MD               venlafaxine XR (EFFEXOR-XR) 150 MG 24 hr capsule [Pharmacy Med Name: VENLAFAXINE HCL ER 150 MG CAP] 90 capsule 0    Sig: TAKE 1 CAPSULE BY MOUTH DAILY WITH BREAKFAST.     Psychiatry: Antidepressants - SNRI - desvenlafaxine & venlafaxine Failed - 01/31/2024  2:36 PM      Failed - Cr in normal range and within 360 days    Creat  Date Value Ref Range Status  01/24/2024 1.33 (H) 0.70 - 1.28 mg/dL Final   Creatinine, Urine  Date Value Ref Range Status  11/26/2022 167 20 - 320 mg/dL Final         Failed - Completed PHQ-2 or PHQ-9 in the last 360 days      Failed - Valid encounter within last 6 months    Recent Outpatient Visits           1 week ago Type 2 diabetes mellitus without complication, without long-term current use of insulin (HCC)   [place] [facility] Pickard, Priscille Heidelberg, MD   2 months ago Right  flank pain   [place] [facility] Tanya Nones, Priscille Heidelberg, MD   7 months ago Neck mass   [place] [facility] Donita Brooks, MD   8 months ago Chest wall pain   [place] [facility] Donita Brooks, MD   8 months ago Chest wall pain    [facility] Donita Brooks, MD              Failed - Lipid Panel in normal range within the last 12 months    Cholesterol  Date Value Ref Range Status  01/24/2024 188 <200 mg/dL Final   LDL Cholesterol (Calc)  Date Value Ref Range Status  01/24/2024 108 (H) mg/dL (calc) Final    Comment:    Reference range: <100 . Desirable range <100 mg/dL for primary prevention;   <70 mg/dL for patients with CHD or diabetic patients  with > or = 2 CHD risk factors. Marland Kitchen LDL-C is now calculated using the Martin-Hopkins  calculation, which is a validated novel method providing  better accuracy than the Friedewald equation in the  estimation of LDL-C.  Horald Pollen et al. Lenox Ahr. 2130;865(78): 2061-2068  (http://education.[facility].com/faq/FAQ164)    HDL  Date Value Ref Range Status  01/24/2024 52 > OR = 40 mg/dL Final   Triglycerides  Date Value Ref Range Status  01/24/2024 162 (H) <150 mg/dL Final         Passed - Last BP in normal range    BP Readings from Last 1 Encounters:  01/24/24 130/72

## 2024-03-09 DIAGNOSIS — H04123 Dry eye syndrome of bilateral lacrimal glands: Secondary | ICD-10-CM | POA: Diagnosis not present

## 2024-03-09 DIAGNOSIS — H02403 Unspecified ptosis of bilateral eyelids: Secondary | ICD-10-CM | POA: Diagnosis not present

## 2024-03-09 DIAGNOSIS — H401112 Primary open-angle glaucoma, right eye, moderate stage: Secondary | ICD-10-CM | POA: Diagnosis not present

## 2024-04-10 ENCOUNTER — Encounter: Payer: Self-pay | Admitting: Family Medicine

## 2024-04-14 ENCOUNTER — Ambulatory Visit (INDEPENDENT_AMBULATORY_CARE_PROVIDER_SITE_OTHER): Admitting: Family Medicine

## 2024-04-14 ENCOUNTER — Encounter: Payer: Self-pay | Admitting: Family Medicine

## 2024-04-14 VITALS — BP 138/75 | HR 83 | Temp 97.4°F | Ht 66.0 in

## 2024-04-14 DIAGNOSIS — E039 Hypothyroidism, unspecified: Secondary | ICD-10-CM

## 2024-04-14 MED ORDER — FLUTICASONE PROPIONATE 50 MCG/ACT NA SUSP: 2.0000 | Freq: Every day | NASAL | 6 refills | Status: AC

## 2024-04-14 MED ORDER — VENLAFAXINE HCL ER 75 MG PO CP24: 75.0000 mg | ORAL_CAPSULE | Freq: Every day | ORAL | 1 refills | Status: DC

## 2024-04-14 MED ORDER — TADALAFIL 20 MG PO TABS: 10.0000 mg | ORAL_TABLET | ORAL | 11 refills | Status: AC | PRN

## 2024-04-14 NOTE — Progress Notes (Signed)
 Subjective:    Patient ID: Shaun Gomez, male    DOB: February 16, 1951, 73 y.o.   MRN: [id number]  Wt Readings from Last 3 Encounters:  01/24/24 200 lb 6.4 oz (90.9 kg)  11/16/23 200 lb (90.7 kg)  06/11/23 197 lb (89.4 kg)   Patient has almost lost 10 pounds since I last saw him.  This was unintentional.  His TSH was slightly low in March.  We should recheck this today to make sure that we are not overdosing his thyroid  medication.  He has been working more and perhaps is burning more calories.  He has a history of sleep apnea and he has stopped wearing his CPAP machine due to sinus congestion.  Unfortunately however that is debilitating least the entire.  He also does not feel Effexor  helping with depression.  He states that he has no energy and no drive.  He denies any chest pain or shortness of breath.  Past Medical History:  Diagnosis Date   CAD (coronary artery disease)    Depression 07/1993   Diabetes mellitus without complication (HCC)    Hyperlipidemia 02/1996   Hypertension pre 08/25/1996   OSA on CPAP    Thyroid  disease 07/1993   Hypothyroidism   Past Surgical History:  Procedure Laterality Date   APPENDECTOMY  2 YOA   CATARACT EXTRACTION, BILATERAL     CORONARY ARTERY BYPASS GRAFT  02/1996   X 2 EF 30%   MI S/P Caverject Injection  02/1996   RETINAL DETACHMENT SURGERY     Sleep Study  01/03/2006   Severe sleep apnea - 88 events per hour )2 sats down to 50% ?   Stress Cardiolite  03/1996   EF 30%   Current Outpatient Medications on File Prior to Visit  Medication Sig Dispense Refill   aspirin 325 MG tablet Take 325 mg by mouth daily.     dapagliflozin  propanediol (FARXIGA ) 10 MG TABS tablet Take 1 tablet (10 mg total) by mouth daily before breakfast. 90 tablet 5   diazepam  (VALIUM ) 5 MG tablet Take 1 tablet (5 mg total) by mouth every 12 (twelve) hours as needed for anxiety (take 30 minutes before scan). 10 tablet 0   latanoprost (XALATAN) 0.005 % ophthalmic solution  Place 1 drop into both eyes at bedtime.      levocetirizine (XYZAL  ALLERGY 24HR) 5 MG tablet Take 1 tablet (5 mg total) by mouth every evening. 30 tablet 3   levothyroxine  (SYNTHROID ) 150 MCG tablet Take 1 tablet (150 mcg total) by mouth daily. 90 tablet 3   lisinopril  (ZESTRIL ) 20 MG tablet TAKE 1 TABLET BY MOUTH EVERY DAY 90 tablet 0   metFORMIN  (GLUCOPHAGE -XR) 500 MG 24 hr tablet Take 2 tablets (1,000 mg total) by mouth 2 (two) times daily. 360 tablet 3   Multiple Vitamin (MULTIVITAMIN) tablet Take 1 tablet by mouth daily.     neomycin -polymyxin-hydrocortisone (CORTISPORIN) OTIC solution Place 3 drops into the right ear 4 (four) times daily. 10 mL 0   sildenafil  (VIAGRA ) 100 MG tablet Take 0.5-1 tablets (50-100 mg total) by mouth daily as needed for erectile dysfunction. 5 tablet 11   timolol (TIMOPTIC) 0.25 % ophthalmic solution 1 drop 2 (two) times daily.     traZODone  (DESYREL ) 50 MG tablet TAKE 1 TABLET BY MOUTH AT BEDTIME AS NEEDED FOR SLEEP. 90 tablet 1   venlafaxine  XR (EFFEXOR -XR) 150 MG 24 hr capsule TAKE 1 CAPSULE BY MOUTH DAILY WITH BREAKFAST. 90 capsule 0   No current  facility-administered medications on file prior to visit.   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  Tobacco Use   Smoking status: Former    Current packs/day: 2.00    Average packs/day: 2.0 packs/day for 15.0 years (30.0 ttl pk-yrs)    Types: Cigarettes   Smokeless tobacco: Never   Tobacco comments:    Quit 1983  Substance and Sexual Activity   Alcohol use: Yes    Comment: case of beer    Drug use: Not on file   Sexual activity: Not on  file  Other Topics Concern   Not on file  Social History Narrative   Re-married, lives with wife   2 step-children, 2 granddaughters   Working at [employer] in [employer], Materials engineer   Social Drivers of Health   Financial Resource Strain: Low Risk  (09/17/2020)   Overall Financial Resource Strain (CARDIA)    Difficulty of Paying Living Expenses: Not very hard  Food Insecurity: Not on file  Transportation Needs: Not on file  Physical Activity: Not on file  Stress: Not on file  Social Connections: Not on file  Intimate Partner Violence: Not on file   Family History  Problem Relation Age of Onset   Hypertension Mother    Parkinsonism Mother    Dementia Mother    Diabetes Mother    Stroke Father 54   Depression Sister    Anxiety disorder Sister    Heart disease Brother 8       CAD   Hyperlipidemia Brother    Hypertension Brother    Arthritis Neg Hx    Cancer Neg Hx    Alcohol abuse Neg Hx    Drug abuse Neg Hx    Colon cancer Neg Hx    Prostate cancer Neg Hx    Heart disease Brother 39       CAD      Review of Systems  All other systems reviewed and are negative.      Objective:   Physical Exam Vitals reviewed.  Constitutional:      General: He is not in acute distress.    Appearance: He is well-developed. He is not diaphoretic.  HENT:     Head: Normocephalic and atraumatic.     Right Ear: External ear normal.     Left Ear: External ear normal.     Nose:     Right Sinus: No maxillary sinus tenderness or frontal sinus tenderness.     Left Sinus: No maxillary sinus tenderness or frontal sinus tenderness.     Mouth/Throat:     Pharynx: No oropharyngeal exudate.   Eyes:     General: No scleral icterus.    Conjunctiva/sclera: Conjunctivae normal.     Pupils: Pupils are equal, round, and reactive to light.   Neck:     Thyroid : No thyromegaly.     Vascular: No JVD.   Cardiovascular:     Rate and Rhythm: Normal rate and regular rhythm.      Heart sounds: Normal heart sounds. No murmur heard.  No friction rub. No gallop.  Pulmonary:     Effort: Pulmonary effort is normal. No respiratory distress.     Breath sounds: No stridor. No wheezing, rhonchi or rales.  Abdominal:     General: Bowel sounds are normal. There is no distension.     Palpations: Abdomen is soft.     Tenderness: There is no abdominal tenderness. There is no guarding or rebound.   Musculoskeletal:     Cervical back: Neck supple.     Thoracic back: No spasms or tenderness. Normal range of motion.     Lumbar back: No spasms, tenderness or bony tenderness. Normal range of motion.  Lymphadenopathy:     Cervical: No cervical adenopathy.   Neurological:     Mental Status: He is alert and oriented to person, place, and time.     Cranial Nerves: No cranial nerve deficit.     Motor: No abnormal muscle tone.     Coordination: Coordination normal.     Deep Tendon Reflexes: Reflexes are normal and symmetric.         Assessment & Plan:  Hypothyroidism, unspecified type - Plan: TSH I have recommended trying Flonase daily to help with sinus congestion in the hopes that he can tolerate his CPAP machine.  I will check a TSH to ensure that we are not giving him excessive amounts of levothyroxine  because his weight loss.  We will decrease the dose of venlafaxine  to 75 mg a day and slowly try to wean the patient off the medication since he does not feel that is helpful.

## 2024-04-15 LAB — TSH: TSH: <0.01 m[IU]/L — ABNORMAL LOW (ref 0.40–4.50)

## 2024-04-17 ENCOUNTER — Other Ambulatory Visit: Payer: Self-pay

## 2024-04-17 ENCOUNTER — Ambulatory Visit: Payer: Self-pay | Admitting: Family Medicine

## 2024-04-17 MED ORDER — LEVOTHYROXINE SODIUM 100 MCG PO TABS: 100.0000 ug | ORAL_TABLET | Freq: Every day | ORAL | 3 refills | Status: AC

## 2024-04-29 ENCOUNTER — Other Ambulatory Visit: Payer: Self-pay | Admitting: Family Medicine

## 2024-05-01 ENCOUNTER — Telehealth: Payer: Self-pay | Admitting: Family Medicine

## 2024-05-01 ENCOUNTER — Other Ambulatory Visit: Payer: Self-pay

## 2024-05-01 MED ORDER — LISINOPRIL 20 MG PO TABS: 20.0000 mg | ORAL_TABLET | Freq: Every day | ORAL | 0 refills | Status: DC

## 2024-05-01 NOTE — Telephone Encounter (Signed)
 Prescription Request  05/01/2024  LOV: 04/14/2024  What is the name of the medication or equipment? lisinopril  (ZESTRIL ) 20 MG tablet   Have you contacted your pharmacy to request a refill? Yes   Which pharmacy would you like this sent to?  [id number] [address], [place] - [address] [place] [address] Phone: [phone] Fax: [phone]    Patient notified that their request is being sent to the clinical staff for review and that they should receive a response within 2 business days.   Please advise at [facility] [phone]

## 2024-05-01 NOTE — Telephone Encounter (Signed)
 Sent in medication

## 2024-05-02 ENCOUNTER — Other Ambulatory Visit: Payer: Self-pay

## 2024-05-02 ENCOUNTER — Telehealth: Payer: Self-pay | Admitting: Family Medicine

## 2024-05-02 MED ORDER — VENLAFAXINE HCL ER 75 MG PO CP24
75.0000 mg | ORAL_CAPSULE | Freq: Every day | ORAL | 1 refills | Status: DC
Start: 2024-05-02 — End: 2024-08-07

## 2024-05-02 NOTE — Telephone Encounter (Signed)
 Prescription Request  05/02/2024  LOV: 04/14/2024  What is the name of the medication or equipment? venlafaxine  XR (EFFEXOR  XR) 75 MG 24 hr capsule   Have you contacted your pharmacy to request a refill? Yes   Which pharmacy would you like this sent to?  [id number] [address], [place] - [address] [facility] [address] [place] [address] Phone: [phone] Fax: [phone]    Patient notified that their request is being sent to the clinical staff for review and that they should receive a response within 2 business days.   Please advise at [facility] [phone]

## 2024-05-02 NOTE — Telephone Encounter (Signed)
 Med sent.

## 2024-06-22 DIAGNOSIS — M25561 Pain in right knee: Secondary | ICD-10-CM | POA: Diagnosis not present

## 2024-07-26 ENCOUNTER — Other Ambulatory Visit: Payer: Self-pay | Admitting: Family Medicine

## 2024-08-04 ENCOUNTER — Other Ambulatory Visit: Payer: Self-pay | Admitting: Family Medicine

## 2024-09-10 DIAGNOSIS — T8522XA Displacement of intraocular lens, initial encounter: Secondary | ICD-10-CM | POA: Diagnosis not present

## 2024-09-10 DIAGNOSIS — H1820 Unspecified corneal edema: Secondary | ICD-10-CM | POA: Diagnosis not present

## 2024-09-13 DIAGNOSIS — H43812 Vitreous degeneration, left eye: Secondary | ICD-10-CM | POA: Diagnosis not present

## 2024-09-13 DIAGNOSIS — T8522XA Displacement of intraocular lens, initial encounter: Secondary | ICD-10-CM | POA: Diagnosis not present

## 2024-09-13 DIAGNOSIS — H401131 Primary open-angle glaucoma, bilateral, mild stage: Secondary | ICD-10-CM | POA: Diagnosis not present

## 2024-09-13 DIAGNOSIS — H59811 Chorioretinal scars after surgery for detachment, right eye: Secondary | ICD-10-CM | POA: Diagnosis not present

## 2024-09-24 ENCOUNTER — Other Ambulatory Visit: Payer: Self-pay | Admitting: Family Medicine

## 2024-09-24 DIAGNOSIS — E119 Type 2 diabetes mellitus without complications: Secondary | ICD-10-CM

## 2024-09-28 DIAGNOSIS — T8522XS Displacement of intraocular lens, sequela: Secondary | ICD-10-CM | POA: Diagnosis not present

## 2024-09-28 DIAGNOSIS — T8522XA Displacement of intraocular lens, initial encounter: Secondary | ICD-10-CM | POA: Diagnosis not present

## 2024-09-28 DIAGNOSIS — H26491 Other secondary cataract, right eye: Secondary | ICD-10-CM | POA: Diagnosis not present

## 2024-09-28 DIAGNOSIS — H43811 Vitreous degeneration, right eye: Secondary | ICD-10-CM | POA: Diagnosis not present

## 2024-09-28 DIAGNOSIS — H43391 Other vitreous opacities, right eye: Secondary | ICD-10-CM | POA: Diagnosis not present

## 2024-10-16 ENCOUNTER — Telehealth: Payer: Self-pay

## 2024-10-16 ENCOUNTER — Other Ambulatory Visit: Payer: Self-pay

## 2024-10-16 MED ORDER — VENLAFAXINE HCL ER 75 MG PO CP24: 75.0000 mg | ORAL_CAPSULE | Freq: Every day | ORAL | 1 refills | Status: AC

## 2024-10-16 NOTE — Telephone Encounter (Signed)
 Prescription Request  10/16/2024  LOV: 04/14/24  What is the name of the medication or equipment? venlafaxine  XR (EFFEXOR -XR) 75 MG 24 hr capsule [[id number]]   Have you contacted your pharmacy to request a refill? Yes   Which pharmacy would you like this sent to?  [id number] [address], [place] - [address] [facility] [address] [place] [address] Phone: [phone] Fax: [phone]    Patient notified that their request is being sent to the clinical staff for review and that they should receive a response within 2 business days.   Please advise at [facility] [phone]

## 2024-10-16 NOTE — Telephone Encounter (Signed)
 Sent in medication

## 2024-10-23 ENCOUNTER — Telehealth: Payer: Self-pay

## 2024-10-23 ENCOUNTER — Other Ambulatory Visit: Payer: Self-pay

## 2024-10-23 MED ORDER — TRAZODONE HCL 50 MG PO TABS: 50.0000 mg | ORAL_TABLET | Freq: Every evening | ORAL | 1 refills | Status: AC | PRN

## 2024-10-23 NOTE — Telephone Encounter (Signed)
 Sent in medication

## 2024-10-23 NOTE — Telephone Encounter (Signed)
 Prescription Request  10/23/2024  LOV: 04/14/24  What is the name of the medication or equipment? traZODone  (DESYREL ) 50 MG tablet [[id number]]   Have you contacted your pharmacy to request a refill? Yes   Which pharmacy would you like this sent to?  [id number] [address], [place] - [address] [facility] [address] [place] [address] Phone: [phone] Fax: [phone]    Patient notified that their request is being sent to the clinical staff for review and that they should receive a response within 2 business days.   Please advise at [facility] [phone]
